# Patient Record
Sex: Male | Born: 1950 | Race: White | Hispanic: No | Marital: Married | Smoking: Former smoker
Health system: Southern US, Community
[De-identification: ages and names within clinical notes are randomized; demographics above are authoritative.]

## PROBLEM LIST (undated history)

## (undated) DIAGNOSIS — K5790 Diverticulosis of intestine, part unspecified, without perforation or abscess without bleeding: Secondary | ICD-10-CM

## (undated) DIAGNOSIS — N189 Chronic kidney disease, unspecified: Secondary | ICD-10-CM

## (undated) DIAGNOSIS — C801 Malignant (primary) neoplasm, unspecified: Secondary | ICD-10-CM

## (undated) DIAGNOSIS — K219 Gastro-esophageal reflux disease without esophagitis: Secondary | ICD-10-CM

## (undated) DIAGNOSIS — I10 Essential (primary) hypertension: Secondary | ICD-10-CM

## (undated) HISTORY — PX: BOWEL RESECTION: SHX1257

## (undated) HISTORY — PX: TONSILLECTOMY: SUR1361

---

## 1997-04-19 HISTORY — PX: BOWEL RESECTION: SHX1257

## 1997-12-26 ENCOUNTER — Ambulatory Visit (HOSPITAL_COMMUNITY): Admission: RE | Admit: 1997-12-26 | Discharge: 1997-12-26 | Payer: Self-pay | Admitting: Gastroenterology

## 1998-01-16 ENCOUNTER — Ambulatory Visit (HOSPITAL_COMMUNITY): Admission: RE | Admit: 1998-01-16 | Discharge: 1998-01-16 | Payer: Self-pay | Admitting: Gastroenterology

## 1998-01-16 ENCOUNTER — Encounter: Payer: Self-pay | Admitting: Gastroenterology

## 1998-01-20 ENCOUNTER — Encounter: Payer: Self-pay | Admitting: Emergency Medicine

## 1998-01-20 ENCOUNTER — Encounter: Payer: Self-pay | Admitting: Gastroenterology

## 1998-01-20 ENCOUNTER — Inpatient Hospital Stay (HOSPITAL_COMMUNITY): Admission: EM | Admit: 1998-01-20 | Discharge: 1998-01-24 | Payer: Self-pay | Admitting: Emergency Medicine

## 1998-02-10 ENCOUNTER — Encounter (HOSPITAL_BASED_OUTPATIENT_CLINIC_OR_DEPARTMENT_OTHER): Payer: Self-pay | Admitting: General Surgery

## 1998-02-12 ENCOUNTER — Inpatient Hospital Stay (HOSPITAL_COMMUNITY): Admission: RE | Admit: 1998-02-12 | Discharge: 1998-02-17 | Payer: Self-pay | Admitting: General Surgery

## 2013-03-11 ENCOUNTER — Encounter (HOSPITAL_BASED_OUTPATIENT_CLINIC_OR_DEPARTMENT_OTHER): Payer: Self-pay | Admitting: Emergency Medicine

## 2013-03-11 ENCOUNTER — Inpatient Hospital Stay (HOSPITAL_BASED_OUTPATIENT_CLINIC_OR_DEPARTMENT_OTHER)
Admission: EM | Admit: 2013-03-11 | Discharge: 2013-03-13 | DRG: 392 | Disposition: A | Discharge status: Home / Self Care | Payer: BC Managed Care – PPO | Attending: Internal Medicine | Admitting: Internal Medicine

## 2013-03-11 ENCOUNTER — Emergency Department (HOSPITAL_BASED_OUTPATIENT_CLINIC_OR_DEPARTMENT_OTHER): Payer: BC Managed Care – PPO

## 2013-03-11 DIAGNOSIS — Z9049 Acquired absence of other specified parts of digestive tract: Secondary | ICD-10-CM

## 2013-03-11 DIAGNOSIS — Z823 Family history of stroke: Secondary | ICD-10-CM

## 2013-03-11 DIAGNOSIS — K5732 Diverticulitis of large intestine without perforation or abscess without bleeding: Principal | ICD-10-CM

## 2013-03-11 DIAGNOSIS — K219 Gastro-esophageal reflux disease without esophagitis: Secondary | ICD-10-CM | POA: Diagnosis present

## 2013-03-11 DIAGNOSIS — D696 Thrombocytopenia, unspecified: Secondary | ICD-10-CM | POA: Diagnosis present

## 2013-03-11 DIAGNOSIS — D72829 Elevated white blood cell count, unspecified: Secondary | ICD-10-CM | POA: Diagnosis present

## 2013-03-11 DIAGNOSIS — K5792 Diverticulitis of intestine, part unspecified, without perforation or abscess without bleeding: Secondary | ICD-10-CM | POA: Diagnosis present

## 2013-03-11 HISTORY — DX: Diverticulosis of intestine, part unspecified, without perforation or abscess without bleeding: K57.90

## 2013-03-11 HISTORY — DX: Gastro-esophageal reflux disease without esophagitis: K21.9

## 2013-03-11 LAB — CBC WITH DIFFERENTIAL/PLATELET
Basophils Absolute: 0 10*3/uL (ref 0.0–0.1)
Basophils Relative: 0 % (ref 0–1)
Eosinophils Absolute: 0.1 10*3/uL (ref 0.0–0.7)
Eosinophils Relative: 0 % (ref 0–5)
HCT: 45 % (ref 39.0–52.0)
Hemoglobin: 15.8 g/dL (ref 13.0–17.0)
Lymphocytes Relative: 10 % — ABNORMAL LOW (ref 12–46)
Lymphs Abs: 2.2 10*3/uL (ref 0.7–4.0)
MCH: 28.8 pg (ref 26.0–34.0)
MCHC: 35.1 g/dL (ref 30.0–36.0)
MCV: 82.1 fL (ref 78.0–100.0)
Monocytes Absolute: 1.8 10*3/uL — ABNORMAL HIGH (ref 0.1–1.0)
Monocytes Relative: 8 % (ref 3–12)
Neutro Abs: 18.6 10*3/uL — ABNORMAL HIGH (ref 1.7–7.7)
Neutrophils Relative %: 82 % — ABNORMAL HIGH (ref 43–77)
Platelets: 169 10*3/uL (ref 150–400)
RBC: 5.48 MIL/uL (ref 4.22–5.81)
RDW: 13.6 % (ref 11.5–15.5)
WBC: 22.6 10*3/uL — ABNORMAL HIGH (ref 4.0–10.5)

## 2013-03-11 LAB — COMPREHENSIVE METABOLIC PANEL
ALT: 20 U/L (ref 0–53)
AST: 22 U/L (ref 0–37)
Albumin: 3.9 g/dL (ref 3.5–5.2)
Alkaline Phosphatase: 62 U/L (ref 39–117)
BUN: 19 mg/dL (ref 6–23)
CO2: 26 mEq/L (ref 19–32)
Calcium: 8.9 mg/dL (ref 8.4–10.5)
Chloride: 100 mEq/L (ref 96–112)
Creatinine, Ser: 1.2 mg/dL (ref 0.50–1.35)
GFR calc Af Amer: 74 mL/min — ABNORMAL LOW (ref >90)
GFR calc non Af Amer: 64 mL/min — ABNORMAL LOW (ref >90)
Glucose, Bld: 101 mg/dL — ABNORMAL HIGH (ref 70–99)
Potassium: 3.9 mEq/L (ref 3.5–5.1)
Sodium: 137 mEq/L (ref 135–145)
Total Bilirubin: 1.8 mg/dL — ABNORMAL HIGH (ref 0.3–1.2)
Total Protein: 7 g/dL (ref 6.0–8.3)

## 2013-03-11 LAB — CBC
HCT: 39.6 % (ref 39.0–52.0)
Hemoglobin: 14.1 g/dL (ref 13.0–17.0)
MCH: 29.1 pg (ref 26.0–34.0)
MCHC: 35.6 g/dL (ref 30.0–36.0)
MCV: 81.6 fL (ref 78.0–100.0)
RBC: 4.85 MIL/uL (ref 4.22–5.81)
WBC: 14.9 10*3/uL — ABNORMAL HIGH (ref 4.0–10.5)

## 2013-03-11 LAB — URINE MICROSCOPIC-ADD ON

## 2013-03-11 LAB — URINALYSIS, ROUTINE W REFLEX MICROSCOPIC
Bilirubin Urine: NEGATIVE
Hgb urine dipstick: NEGATIVE
Leukocytes, UA: NEGATIVE
Nitrite: NEGATIVE
Protein, ur: 30 mg/dL — AB
Urobilinogen, UA: 1 mg/dL (ref 0.0–1.0)

## 2013-03-11 LAB — CREATININE, SERUM
Creatinine, Ser: 1.08 mg/dL (ref 0.50–1.35)
GFR calc Af Amer: 84 mL/min — ABNORMAL LOW (ref >90)

## 2013-03-11 IMAGING — CT CT ABD-PELV W/ CM
2 of 5 series · 16 of 46 positions shown, 18 images · IV contrast (APPLIED)
Comparison: None.

CLINICAL DATA: Left lower quadrant abdominal pain for 1 day

EXAM:
CT ABDOMEN AND PELVIS WITH CONTRAST
TECHNIQUE: Multidetector CT imaging of the abdomen and pelvis was performed
using the standard protocol following bolus administration of
intravenous contrast.
CONTRAST:  50mL OMNIPAQUE IOHEXOL 300 MG/ML SOLN, 100mL OMNIPAQUE
IOHEXOL 300 MG/ML SOLN

[Series 2: abd/pelvis 5.0 b31f · axial · 0.78mm/px · z∈[+844,+1294]mm · 13 of 101 slices shown, 15 images]
[im 6/101  soft-tissue]
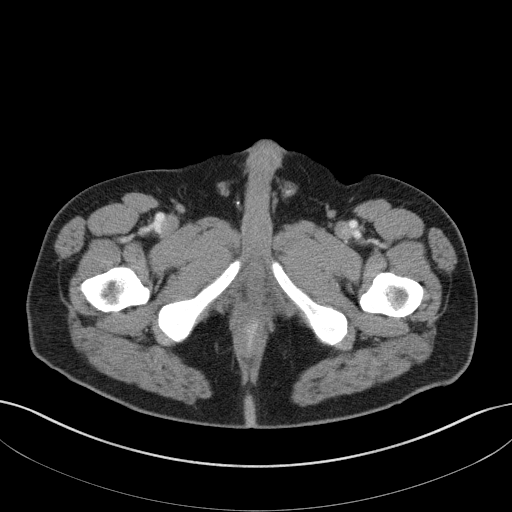
[im 6/101  bone]
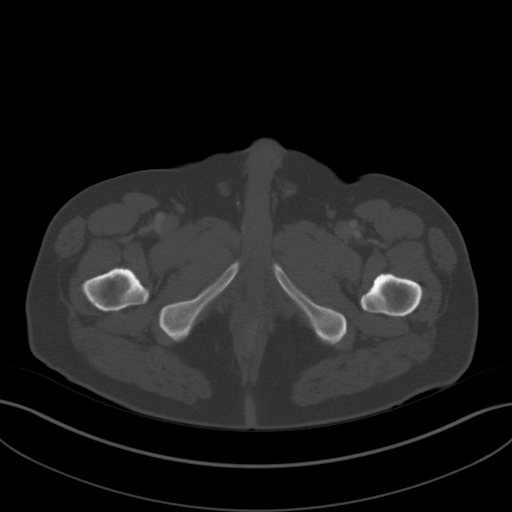
[im 16/101  soft-tissue]
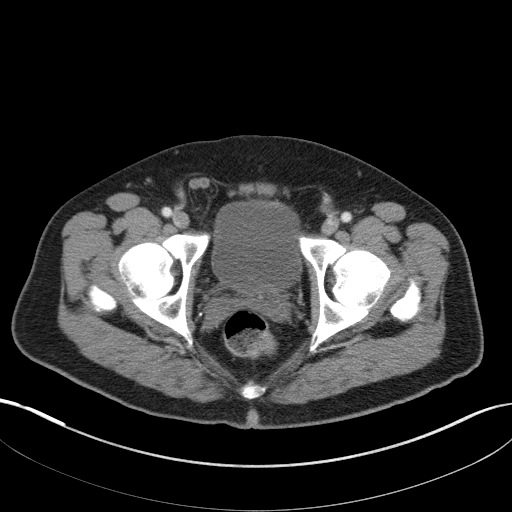
[im 21/101  soft-tissue]
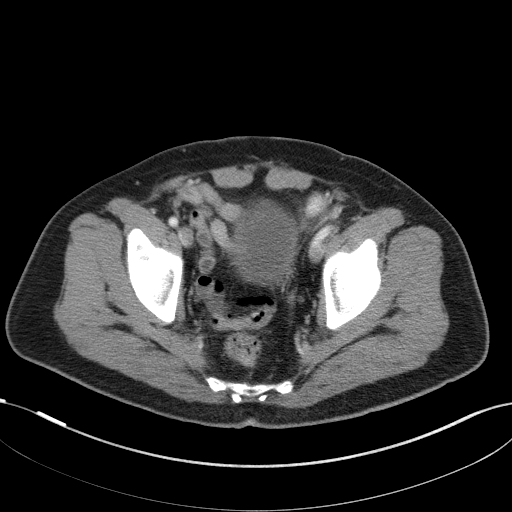
[im 31/101  soft-tissue]
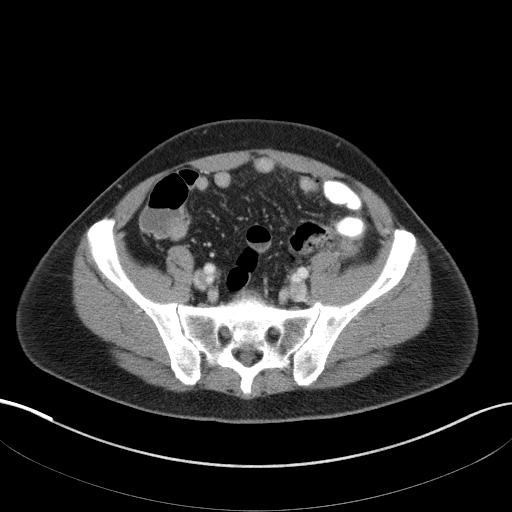
[im 36/101  soft-tissue]
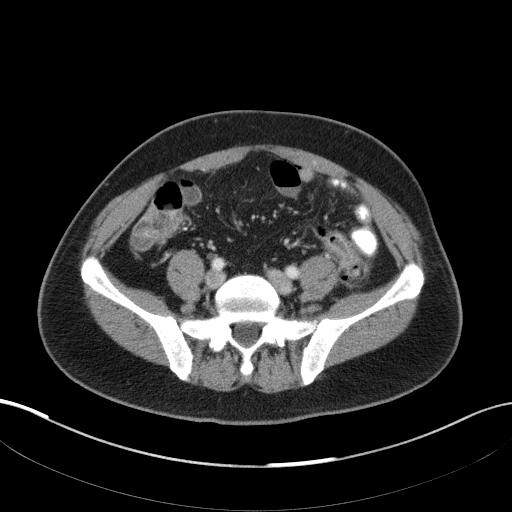
[im 46/101  soft-tissue]
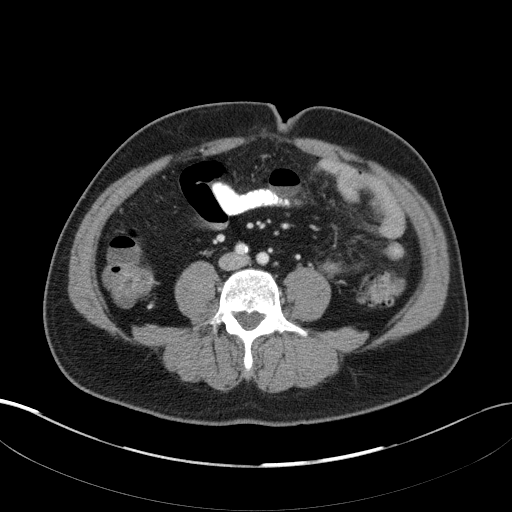
[im 51/101  soft-tissue]
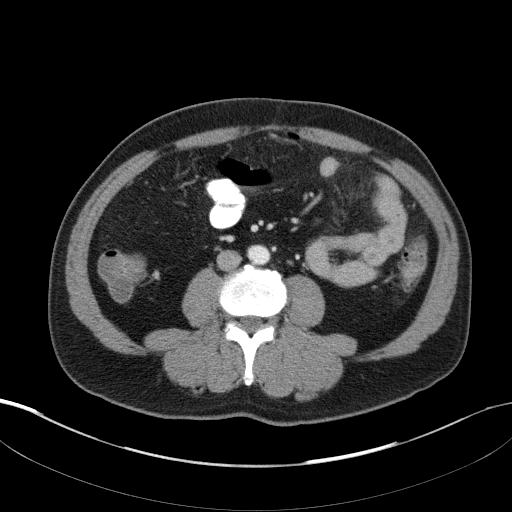
[im 56/101  soft-tissue]
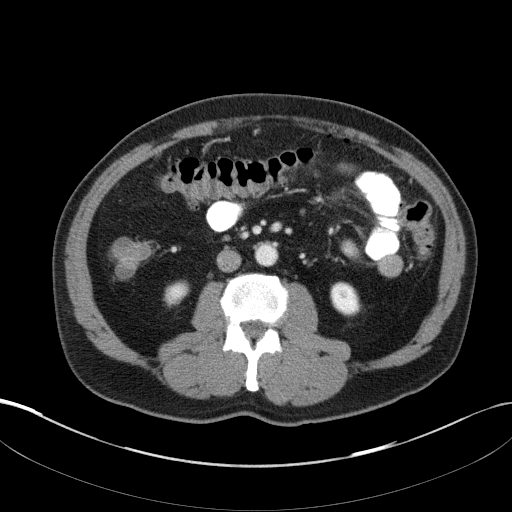
[im 66/101  soft-tissue]
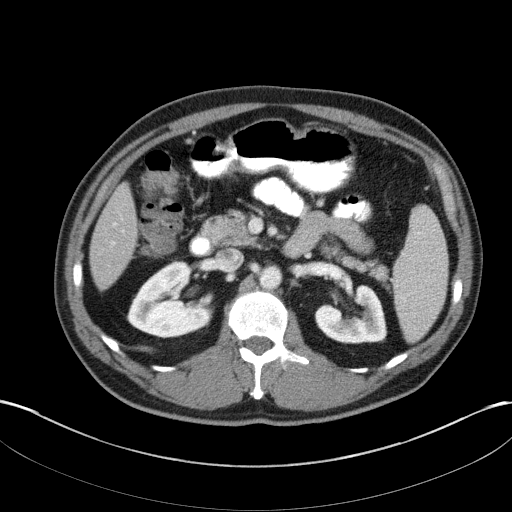
[im 66/101  bone]
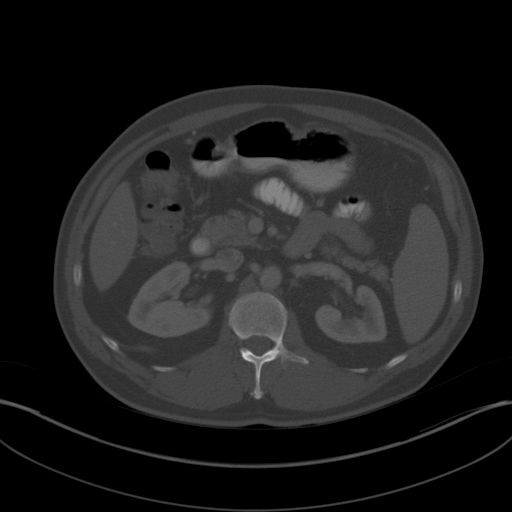
[im 71/101  soft-tissue]
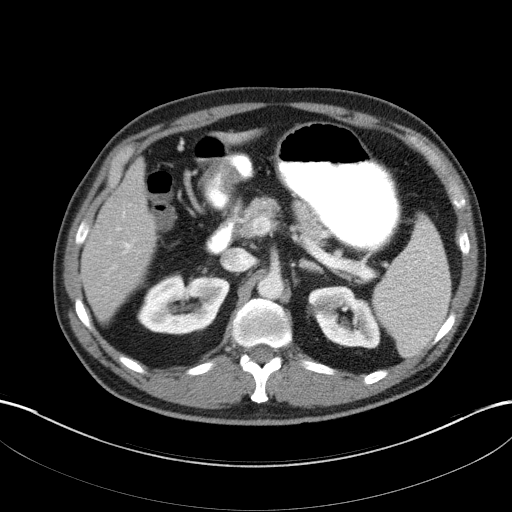
[im 81/101  soft-tissue]
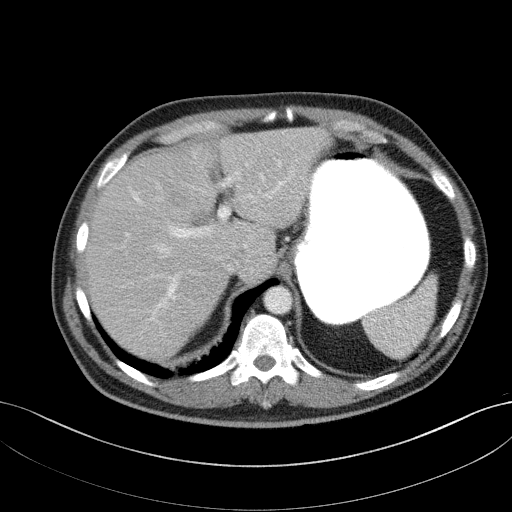
[im 86/101  soft-tissue]
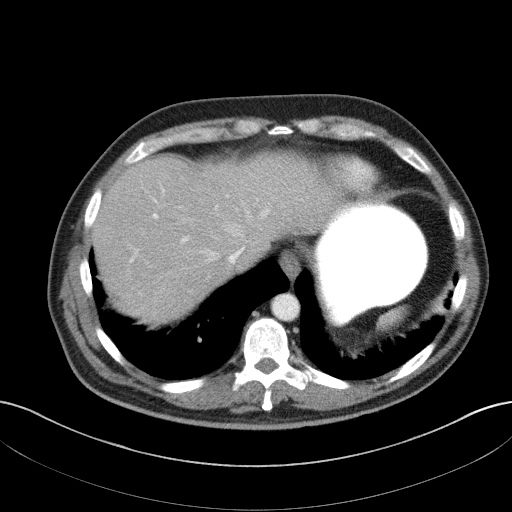
[im 96/101  soft-tissue]
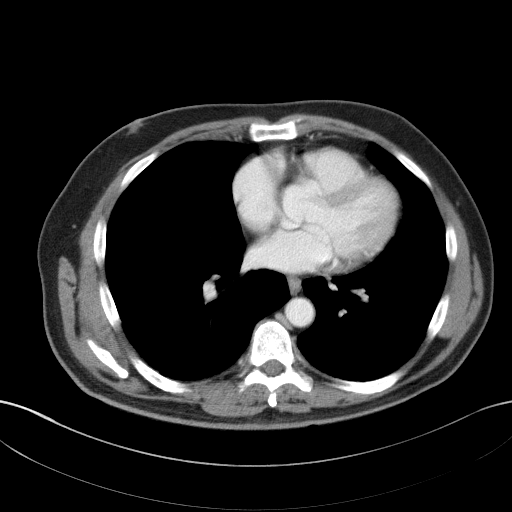

[Series 5: abd/pelvis 3.0 coronal · coronal · 0.93mm/px · 3 of 81 slices shown]
[im 27/81  soft-tissue]
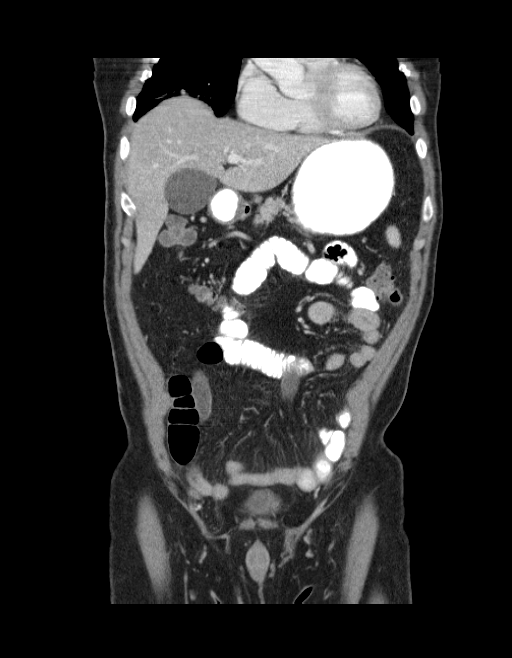
[im 36/81  soft-tissue]
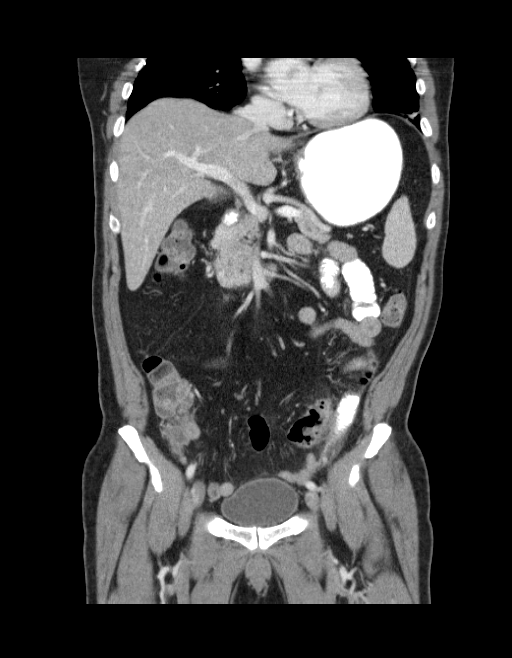
[im 45/81  soft-tissue]
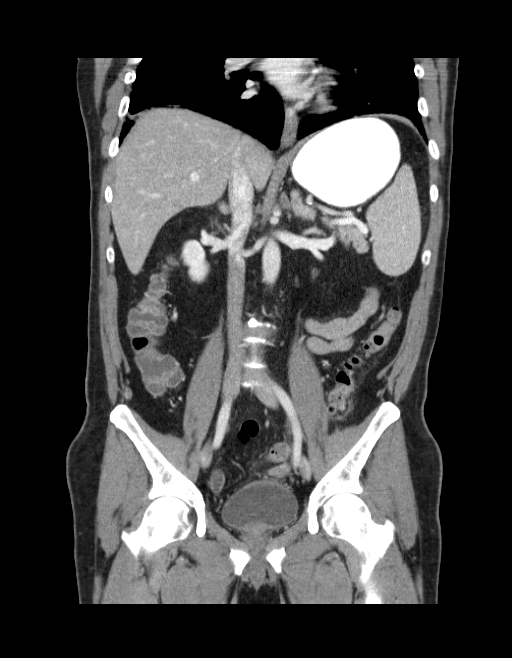

[16 of 46 positions shown; findings below may reference images not displayed]

FINDINGS: Liver, gallbladder, spleen, pancreas, kidneys, and adrenal glands
are normal. Abdominal aorta is normal.

There is a nonobstructive bowel gas pattern. There is mild to
moderate diverticulosis of the distal descending colon and of the
sigmoid colon there is mild diverticulosis. Patient appears to be
status post partial resection and reanastomosis of sigmoid colon.
There is mild inflammatory change in the mesenteric fat at the
junction of the descending and sigmoid colon with mild colon wall
thickening. Bladder and reproductive organs are normal. There is no
ascites, abscess, or free air.

The visualized portions of the lung bases are clear. There are no
acute musculoskeletal findings.
IMPRESSION: Mild acute diverticulitis

## 2013-03-11 MED ORDER — PANTOPRAZOLE SODIUM 40 MG IV SOLR
40.0000 mg | INTRAVENOUS | Status: DC
  Administered 2013-03-11 – 2013-03-12 (×2): 40 mg via INTRAVENOUS
  Filled 2013-03-11 (×4): qty 40

## 2013-03-11 MED ORDER — SODIUM CHLORIDE 0.9 % IV SOLN
INTRAVENOUS | Status: DC
  Administered 2013-03-11: 19:00:00 via INTRAVENOUS

## 2013-03-11 MED ORDER — HYDROMORPHONE HCL PF 1 MG/ML IJ SOLN: 0.5000 mg | INTRAMUSCULAR | Status: DC | PRN

## 2013-03-11 MED ORDER — ALBUTEROL SULFATE (5 MG/ML) 0.5% IN NEBU: 2.5000 mg | INHALATION_SOLUTION | RESPIRATORY_TRACT | Status: DC | PRN

## 2013-03-11 MED ORDER — SODIUM CHLORIDE 0.9 % IV BOLUS (SEPSIS)
500.0000 mL | Freq: Once | INTRAVENOUS | Status: AC
  Administered 2013-03-11: 500 mL via INTRAVENOUS

## 2013-03-11 MED ORDER — METRONIDAZOLE IN NACL 5-0.79 MG/ML-% IV SOLN
500.0000 mg | Freq: Three times a day (TID) | INTRAVENOUS | Status: DC
  Administered 2013-03-12 – 2013-03-13 (×5): 500 mg via INTRAVENOUS
  Filled 2013-03-11 (×8): qty 100

## 2013-03-11 MED ORDER — ONDANSETRON HCL 4 MG/2ML IJ SOLN: 4.0000 mg | Freq: Three times a day (TID) | INTRAMUSCULAR | Status: DC | PRN

## 2013-03-11 MED ORDER — MORPHINE SULFATE 2 MG/ML IJ SOLN
1.0000 mg | INTRAMUSCULAR | Status: DC | PRN
  Administered 2013-03-11: 1 mg via INTRAVENOUS
  Filled 2013-03-11: qty 1

## 2013-03-11 MED ORDER — ENOXAPARIN SODIUM 40 MG/0.4ML ~~LOC~~ SOLN
40.0000 mg | SUBCUTANEOUS | Status: DC
  Administered 2013-03-11 – 2013-03-12 (×2): 40 mg via SUBCUTANEOUS
  Filled 2013-03-11 (×4): qty 0.4

## 2013-03-11 MED ORDER — ACETAMINOPHEN 325 MG PO TABS: 650.0000 mg | ORAL_TABLET | Freq: Four times a day (QID) | ORAL | Status: DC | PRN

## 2013-03-11 MED ORDER — IOHEXOL 300 MG/ML  SOLN
50.0000 mL | Freq: Once | INTRAMUSCULAR | Status: AC | PRN
  Administered 2013-03-11: 50 mL via ORAL

## 2013-03-11 MED ORDER — MORPHINE SULFATE 4 MG/ML IJ SOLN
4.0000 mg | Freq: Once | INTRAMUSCULAR | Status: AC
  Administered 2013-03-11: 4 mg via INTRAVENOUS
  Filled 2013-03-11: qty 1

## 2013-03-11 MED ORDER — IOHEXOL 300 MG/ML  SOLN
100.0000 mL | Freq: Once | INTRAMUSCULAR | Status: AC | PRN
  Administered 2013-03-11: 100 mL via INTRAVENOUS

## 2013-03-11 MED ORDER — CIPROFLOXACIN IN D5W 400 MG/200ML IV SOLN
400.0000 mg | Freq: Two times a day (BID) | INTRAVENOUS | Status: DC
  Administered 2013-03-12 – 2013-03-13 (×3): 400 mg via INTRAVENOUS
  Filled 2013-03-11 (×5): qty 200

## 2013-03-11 MED ORDER — SODIUM CHLORIDE 0.9 % IV SOLN
INTRAVENOUS | Status: DC
  Administered 2013-03-12: 14:00:00 via INTRAVENOUS

## 2013-03-11 MED ORDER — ONDANSETRON HCL 4 MG/2ML IJ SOLN
4.0000 mg | Freq: Once | INTRAMUSCULAR | Status: AC
  Administered 2013-03-11: 4 mg via INTRAVENOUS
  Filled 2013-03-11: qty 2

## 2013-03-11 MED ORDER — ACETAMINOPHEN 650 MG RE SUPP: 650.0000 mg | Freq: Four times a day (QID) | RECTAL | Status: DC | PRN

## 2013-03-11 MED ORDER — CIPROFLOXACIN HCL 500 MG PO TABS
500.0000 mg | ORAL_TABLET | Freq: Once | ORAL | Status: AC
  Administered 2013-03-11: 500 mg via ORAL
  Filled 2013-03-11: qty 1

## 2013-03-11 MED ORDER — OXYCODONE HCL 5 MG PO TABS
5.0000 mg | ORAL_TABLET | ORAL | Status: DC | PRN
  Administered 2013-03-12 (×2): 5 mg via ORAL
  Filled 2013-03-11 (×2): qty 1

## 2013-03-11 MED ORDER — METRONIDAZOLE 500 MG PO TABS
500.0000 mg | ORAL_TABLET | Freq: Once | ORAL | Status: AC
  Administered 2013-03-11: 500 mg via ORAL
  Filled 2013-03-11: qty 1

## 2013-03-11 NOTE — ED Provider Notes (Signed)
CSN: [id number]     Arrival date & time 03/11/13  1216 History   First MD Initiated Contact with Patient 03/11/13 1258     Chief Complaint  Patient presents with  . Abdominal Pain   (Consider location/radiation/quality/duration/timing/severity/associated sxs/prior Treatment) HPI Comments: Patient presents with a two-day history of lower abdominal pain. He states it started suddenly yesterday when he was eating some per day take. He describes as a sharp pain to his left lower abdomen that radiates across his abdomen. He denies any back pain. He denies any nausea vomiting or diarrhea. He denies any fevers or urinary symptoms. He states the pain got worse throughout the day today. It waxes and wanes in intensity. He has a history of diverticulitis in the past with a partial bowel resection. He states this pain is worse than his typical diverticulitis type pain. He denies any diarrhea or hematochezia. He states she's having normal bowel movements.  Patient is a 62 y.o. male presenting with abdominal pain.  Abdominal Pain Associated symptoms: no chest pain, no chills, no cough, no diarrhea, no fatigue, no fever, no hematuria, no nausea, no shortness of breath and no vomiting     Past Medical History  Diagnosis Date  . Diverticulosis   . GERD (gastroesophageal reflux disease)    Past Surgical History  Procedure Laterality Date  . Bowel resection     No family history on file. History  Substance Use Topics  . Smoking status: Never Smoker   . Smokeless tobacco: Not on file  . Alcohol Use: Not on file    Review of Systems  Constitutional: Negative for fever, chills, diaphoresis and fatigue.  HENT: Negative for congestion, rhinorrhea and sneezing.   Eyes: Negative.   Respiratory: Negative for cough, chest tightness and shortness of breath.   Cardiovascular: Negative for chest pain and leg swelling.  Gastrointestinal: Positive for abdominal pain. Negative for nausea, vomiting, diarrhea  and blood in stool.  Genitourinary: Negative for frequency, hematuria, flank pain and difficulty urinating.  Musculoskeletal: Negative for arthralgias and back pain.  Skin: Negative for rash.  Neurological: Negative for dizziness, speech difficulty, weakness, numbness and headaches.    Allergies  Review of patient's allergies indicates no known allergies.  Home Medications   Current Outpatient Rx  Name  Route  Sig  Dispense  Refill  . omeprazole (PRILOSEC) 40 MG capsule   Oral   Take 40 mg by mouth daily.          BP 123/71  Pulse 71  Temp(Src) 98.2 F (36.8 C) (Oral)  Resp 18  Ht 5\' 6"  (1.676 m)  Wt 160 lb (72.576 kg)  BMI 25.84 kg/m2  SpO2 95% Physical Exam  Constitutional: He is oriented to person, place, and time. He appears well-developed and well-nourished.  HENT:  Head: Normocephalic and atraumatic.  Eyes: Pupils are equal, round, and reactive to light.  Neck: Normal range of motion. Neck supple.  Cardiovascular: Normal rate, regular rhythm and normal heart sounds.   Pulmonary/Chest: Effort normal and breath sounds normal. No respiratory distress. He has no wheezes. He has no rales. He exhibits no tenderness.  Abdominal: Soft. Bowel sounds are normal. There is no tenderness (marked tenderness to the left lower abdomen and suprapubic area. There some mild tenderness to the right lower quadrant.). There is no rebound and no guarding.  Musculoskeletal: Normal range of motion. He exhibits no edema.  Lymphadenopathy:    He has no cervical adenopathy.  Neurological: He is alert and  oriented to person, place, and time.  Skin: Skin is warm and dry. No rash noted.  Psychiatric: He has a normal mood and affect.    ED Course  Procedures (including critical care time) Labs Review Results for orders placed during the hospital encounter of 03/11/13  CBC WITH DIFFERENTIAL      Result Value Range   WBC 22.6 (*) 4.0 - 10.5 K/uL   RBC 5.48  4.22 - 5.81 MIL/uL   Hemoglobin  15.8  13.0 - 17.0 g/dL   HCT 19.1  47.8 - 29.5 %   MCV 82.1  78.0 - 100.0 fL   MCH 28.8  26.0 - 34.0 pg   MCHC 35.1  30.0 - 36.0 g/dL   RDW 62.1  30.8 - 65.7 %   Platelets 169  150 - 400 K/uL   Neutrophils Relative % 82 (*) 43 - 77 %   Neutro Abs 18.6 (*) 1.7 - 7.7 K/uL   Lymphocytes Relative 10 (*) 12 - 46 %   Lymphs Abs 2.2  0.7 - 4.0 K/uL   Monocytes Relative 8  3 - 12 %   Monocytes Absolute 1.8 (*) 0.1 - 1.0 K/uL   Eosinophils Relative 0  0 - 5 %   Eosinophils Absolute 0.1  0.0 - 0.7 K/uL   Basophils Relative 0  0 - 1 %   Basophils Absolute 0.0  0.0 - 0.1 K/uL  COMPREHENSIVE METABOLIC PANEL      Result Value Range   Sodium 137  135 - 145 mEq/L   Potassium 3.9  3.5 - 5.1 mEq/L   Chloride 100  96 - 112 mEq/L   CO2 26  19 - 32 mEq/L   Glucose, Bld 101 (*) 70 - 99 mg/dL   BUN 19  6 - 23 mg/dL   Creatinine, Ser 8.46  0.50 - 1.35 mg/dL   Calcium 8.9  8.4 - 96.2 mg/dL   Total Protein 7.0  6.0 - 8.3 g/dL   Albumin 3.9  3.5 - 5.2 g/dL   AST 22  0 - 37 U/L   ALT 20  0 - 53 U/L   Alkaline Phosphatase 62  39 - 117 U/L   Total Bilirubin 1.8 (*) 0.3 - 1.2 mg/dL   GFR calc non Af Amer 64 (*) >90 mL/min   GFR calc Af Amer 74 (*) >90 mL/min  URINALYSIS, ROUTINE W REFLEX MICROSCOPIC      Result Value Range   Color, Urine AMBER (*) YELLOW   APPearance CLEAR  CLEAR   Specific Gravity, Urine 1.030  1.005 - 1.030   pH 6.0  5.0 - 8.0   Glucose, UA NEGATIVE  NEGATIVE mg/dL   Hgb urine dipstick NEGATIVE  NEGATIVE   Bilirubin Urine NEGATIVE  NEGATIVE   Ketones, ur 15 (*) NEGATIVE mg/dL   Protein, ur 30 (*) NEGATIVE mg/dL   Urobilinogen, UA 1.0  0.0 - 1.0 mg/dL   Nitrite NEGATIVE  NEGATIVE   Leukocytes, UA NEGATIVE  NEGATIVE  URINE MICROSCOPIC-ADD ON      Result Value Range   WBC, UA 0-2  <3 WBC/hpf   Bacteria, UA MANY (*) RARE   Urine-Other MUCOUS PRESENT     No results found.   Imaging Review Ct Abdomen Pelvis W Contrast  03/11/2013   CLINICAL DATA:  Left lower quadrant  abdominal pain for 1 day  EXAM: CT ABDOMEN AND PELVIS WITH CONTRAST  TECHNIQUE: Multidetector CT imaging of the abdomen and pelvis was performed using the  standard protocol following bolus administration of intravenous contrast.  CONTRAST:  50mL OMNIPAQUE IOHEXOL 300 MG/ML SOLN, OMNIPAQUE IOHEXOL 300 MG/ML SOLN  COMPARISON:  None.  FINDINGS: Liver, gallbladder, spleen, pancreas, kidneys, and adrenal glands are normal. Abdominal aorta is normal.  There is a nonobstructive bowel gas pattern. There is mild to moderate diverticulosis of the distal descending colon and of the sigmoid colon there is mild diverticulosis. Patient appears to be status post partial resection and reanastomosis of sigmoid colon. There is mild inflammatory change in the mesenteric fat at the junction of the descending and sigmoid colon with mild colon wall thickening. Bladder and reproductive organs are normal. There is no ascites, abscess, or free air.  The visualized portions of the lung bases are clear. There are no acute musculoskeletal findings.  IMPRESSION: Mild acute diverticulitis   Electronically Signed   By: Esperanza Heir M.D.   On: 03/11/2013 15:49    EKG Interpretation   None       MDM   1. Diverticulitis    On exam patient is still pretty markedly tender in his left lower quadrant. He has an elevated white count 22,000. The CT scan shows diverticulitis without evidence of perforation or abscess. He was started on Cipro and Flagyl. However given his amount of tenderness, I feel more comfortable with admission, observation.  Spoke with Dr Jomarie Longs who will admit pt.    Rolan Bucco, MD 03/11/13 [phone]

## 2013-03-11 NOTE — H&P (Signed)
PATIENT DETAILS Name: Derek Cardenas Age: 62 y.o. Sex: male Date of Birth: [birth year] Admit Date: 03/11/2013 YNW:[id number],YQMVH A, MD   CHIEF COMPLAINT:  Left-sided abdominal pain since yesterday evening  HPI: Derek Cardenas is a 62 y.o. male with a Past Medical History of multiple episodes of diverticulitis one of them requiring colectomy due to perforation in the 1990s  presents today with the above noted complaint. Patient apparently was in his usual state of health until yesterday evening around 9 PM when he noticed that he was having some left lower quadrant abdominal pain. As the night progressed to slowly worsened. He claims that the pain is worsened by any sort of activity, and at rest it is not that bad. Pain is described as sharp, with no radiation. There is no associated fever, nausea, vomiting or diarrhea. Because of the persistent and worsening pain, patient presented to med center [facility], per CT scan of the abdomen showed mild left-sided diverticulitis, a CBC showed significant leukocytosis. Patient was then transferred to the hospitalist service at [facility] for further evaluation and treatment. Patient denies any fever, headache, chest pain, shortness of breath, nausea, vomiting or diarrhea. There is no history of dysuria.   ALLERGIES:  No Known Allergies  PAST MEDICAL HISTORY: Past Medical History  Diagnosis Date  . Diverticulosis   . GERD (gastroesophageal reflux disease)     PAST SURGICAL HISTORY: Past Surgical History  Procedure Laterality Date  . Bowel resection      MEDICATIONS AT HOME: Prior to Admission medications   Medication Sig Start Date End Date Taking? Authorizing Provider  omeprazole (PRILOSEC) 40 MG capsule Take 40 mg by mouth daily.   Yes Historical Provider, MD    FAMILY HISTORY: Father/mother-[facility]  SOCIAL HISTORY:  reports that he has never smoked. He does not have any smokeless tobacco history on file. His alcohol and drug  histories are not on file. Works as a Medical illustrator  REVIEW OF SYSTEMS:  Constitutional:   No  weight loss, night sweats,  Fevers, chills, fatigue.  HEENT:    No headaches, Difficulty swallowing,Tooth/dental problems,Sore throat,  No sneezing, itching, ear ache, nasal congestion, post nasal drip,   Cardio-vascular: No chest pain,  Orthopnea, PND, swelling in lower extremities, anasarca,  dizziness, palpitations  GI:  No heartburn, indigestion,  nausea, vomiting, diarrhea, change in  bowel habits, loss of appetite  Resp: No shortness of breath with exertion or at rest.  No excess mucus, no productive cough, No non-productive cough,  No coughing up of blood.No change in color of mucus.No wheezing.No chest wall deformity  Skin:  no rash or lesions.  GU:  no dysuria, change in color of urine, no urgency or frequency.  No flank pain.  Musculoskeletal: No joint pain or swelling.  No decreased range of motion.  No back pain.  Psych: No change in mood or affect. No depression or anxiety.  No memory loss.   PHYSICAL EXAM: Blood pressure 131/76, pulse 74, temperature 98.1 F (36.7 C), temperature source Oral, resp. rate 18, height 5\' 6"  (1.676 m), weight 71.4 kg (157 lb 6.5 oz), SpO2 94.00%.  General appearance :Awake, alert, not in any distress. Speech Clear. Not toxic Looking HEENT: Atraumatic and Normocephalic, pupils equally reactive to light and accomodation Neck: supple, no JVD. No cervical lymphadenopathy.  Chest:Good air entry bilaterally, no added sounds  CVS: S1 S2 regular, no murmurs.  Abdomen: Bowel sounds present, Moderate tenderness in the LLQ and pelvic area, without  gaurding, rigidity  or rebound. Extremities: B/L Lower Ext shows no edema, both legs are warm to touch Neurology: Awake alert, and oriented X 3, CN II-XII intact, Non focal Skin:No Rash Wounds:N/A  LABS ON ADMISSION:   Recent Labs  03/11/13 1405  NA 137  K 3.9  CL 100  CO2 26  GLUCOSE 101*  BUN 19   CREATININE 1.20  CALCIUM 8.9    Recent Labs  03/11/13 1405  AST 22  ALT 20  ALKPHOS 62  BILITOT 1.8*  PROT 7.0  ALBUMIN 3.9   No results found for this basename: LIPASE, AMYLASE,  in the last 72 hours  Recent Labs  03/11/13 1405  WBC 22.6*  NEUTROABS 18.6*  HGB 15.8  HCT 45.0  MCV 82.1  PLT 169   No results found for this basename: CKTOTAL, CKMB, CKMBINDEX, TROPONINI,  in the last 72 hours No results found for this basename: DDIMER,  in the last 72 hours No components found with this basename: POCBNP,    RADIOLOGIC STUDIES ON ADMISSION: Ct Abdomen Pelvis W Contrast  03/11/2013   CLINICAL DATA:  Left lower quadrant abdominal pain for 1 day  EXAM: CT ABDOMEN AND PELVIS WITH CONTRAST  TECHNIQUE: Multidetector CT imaging of the abdomen and pelvis was performed using the standard protocol following bolus administration of intravenous contrast.  CONTRAST:  50mL OMNIPAQUE IOHEXOL 300 MG/ML SOLN, OMNIPAQUE IOHEXOL 300 MG/ML SOLN  COMPARISON:  None.  FINDINGS: Liver, gallbladder, spleen, pancreas, kidneys, and adrenal glands are normal. Abdominal aorta is normal.  There is a nonobstructive bowel gas pattern. There is mild to moderate diverticulosis of the distal descending colon and of the sigmoid colon there is mild diverticulosis. Patient appears to be status post partial resection and reanastomosis of sigmoid colon. There is mild inflammatory change in the mesenteric fat at the junction of the descending and sigmoid colon with mild colon wall thickening. Bladder and reproductive organs are normal. There is no ascites, abscess, or free air.  The visualized portions of the lung bases are clear. There are no acute musculoskeletal findings.  IMPRESSION: Mild acute diverticulitis   Electronically Signed   By: Esperanza Heir M.D.   On: 03/11/2013 15:49    ASSESSMENT AND PLAN: Present on Admission:  . Acute diverticulitis - Was admitted to a medical surgical unit, keep n.p.o.  overnight, start on empiric Cipro and Flagyl. Okay for ice chips. Patient's clinical course will be followed very closely, reassessment will be done in the morning, if better we will slowly advance diet. We will repeat a CBC tomorrow as well. The patient has significant leukocytosis, he is afebrile and has a nontoxic appearance. He also does not look acutely sick.   Marland Kitchen GERD (gastroesophageal reflux disease) - Will place on PPI  Further plan will depend as patient's clinical course evolves and further radiologic and laboratory data become available. Patient will be monitored closely.   DVT Prophylaxis: Prophylactic Lovenox  Code Status: Full Code    Total time spent for admission equals 45 minutes.  [facility] Pager [phone]  If 7PM-7AM, please contact night-coverage [email or web address] Password [facility] 03/11/2013, 7:32 PM

## 2013-03-11 NOTE — Progress Notes (Signed)
[facility] transfer 62/M with LLQ pain, CT with Acute Diverticulitis, no perf or abscess, WBC elevated, vitals stable Accepted to Med Surg bed, Team 10  Zannie Cove, MD [phone]

## 2013-03-11 NOTE — ED Notes (Signed)
Pt having lower abdominal pain since yesterday.  No N/V/D.  No fever.

## 2013-03-11 NOTE — Progress Notes (Signed)
NURSING PROGRESS NOTE  Login [place] [id number] Admission Data: 03/11/2013 8:14 PM Attending Provider: Zannie Cove, MD WUJ:[id number],WGNFA A, MD Code Status: Full  Derek Cardenas is a 62 y.o. male patient admitted from ED:  -No acute distress noted.  -No complaints of shortness of breath.  -No complaints of chest pain.   Blood pressure 131/76, pulse 74, temperature 98.1 F (36.7 C), temperature source Oral, resp. rate 18, height 5\' 6"  (1.676 m), weight 71.4 kg (157 lb 6.5 oz), SpO2 94.00%.   IV Fluids:  IV in place, occlusive dsg intact without redness, IV cath antecubital left, condition patent and no redness Normal saline.   Allergies:  Review of patient's allergies indicates no known allergies.  Past Medical History:   has a past medical history of Diverticulosis and GERD (gastroesophageal reflux disease).  Past Surgical History:   has past surgical history that includes Bowel resection.  Social History:   reports that he has never smoked. He does not have any smokeless tobacco history on file.  Skin: Intact  Patient/Family orientated to room. Information packet given to patient/family. Admission inpatient armband information verified with patient/family to include name and date of birth and placed on patient arm. Side rails up x 2, fall assessment and education completed with patient/family. Patient/family able to verbalize understanding of risk associated with falls and verbalized understanding to call for assistance before getting out of bed. Call light within reach. Patient/family able to voice and demonstrate understanding of unit orientation instructions.    Will continue to evaluate and treat per MD orders.

## 2013-03-12 LAB — COMPREHENSIVE METABOLIC PANEL
ALT: 13 U/L (ref 0–53)
AST: 15 U/L (ref 0–37)
CO2: 22 mEq/L (ref 19–32)
Calcium: 8 mg/dL — ABNORMAL LOW (ref 8.4–10.5)
Chloride: 104 mEq/L (ref 96–112)
Creatinine, Ser: 1.15 mg/dL (ref 0.50–1.35)
GFR calc Af Amer: 78 mL/min — ABNORMAL LOW (ref >90)
GFR calc non Af Amer: 67 mL/min — ABNORMAL LOW (ref >90)
Glucose, Bld: 88 mg/dL (ref 70–99)
Potassium: 3.8 mEq/L (ref 3.5–5.1)
Total Bilirubin: 2 mg/dL — ABNORMAL HIGH (ref 0.3–1.2)

## 2013-03-12 LAB — CBC
Hemoglobin: 14.2 g/dL (ref 13.0–17.0)
MCH: 29.7 pg (ref 26.0–34.0)
MCV: 83.7 fL (ref 78.0–100.0)
Platelets: 127 10*3/uL — ABNORMAL LOW (ref 150–400)
RBC: 4.78 MIL/uL (ref 4.22–5.81)
WBC: 13.7 10*3/uL — ABNORMAL HIGH (ref 4.0–10.5)

## 2013-03-12 MED ORDER — POLYETHYLENE GLYCOL 3350 17 G PO PACK
17.0000 g | PACK | Freq: Two times a day (BID) | ORAL | Status: DC
  Administered 2013-03-12 (×2): 17 g via ORAL
  Filled 2013-03-12 (×6): qty 1

## 2013-03-12 NOTE — Progress Notes (Signed)
[facility] PROGRESS NOTE  Derek Cardenas [id number] DOB: 27-May-1950 DOA: 03/11/2013 PCP: Delorse Lek, MD  HPI/Subjective: Derek Cardenas is a 62 y.o. male with a Past Medical History of multiple episodes of diverticulitis one of them requiring colectomy due to perforation in the 1990s presented with abdominal pain. Patient apparently was in his usual state of health until 11/22 evening around 9 PM when he noticed that he was having some left lower quadrant abdominal pain. As the night progressed to slowly worsened. He claimed that the pain is worsened by any sort of activity, and at rest it is not that bad. Pain is described as sharp, with no radiation. There was no associated fever, nausea, vomiting or diarrhea. Because of the persistent and worsening pain, patient presented to med center [facility], per CT scan of the abdomen showed mild left-sided diverticulitis,  CBC showed significant leukocytosis. Patient was then transferred to the hospitalist service at [facility] for further evaluation and treatment.    Pt is feeling a little better as of 11/24, but still experiencing abdominal pain especially with movement.   Assessment/Plan:  1.Acute diverticulitis  - CT scan showed diverticulitis on left side and CBC showed leukocytosis to 22k - Started on empiric Cipro and Flagyl.  - On clear liquid diet - Patient's clinical course will be followed very closely, reassessment done this  Morning;  better so we will slowly advance diet. - Pt is currently afebrile, CBC rechecked this morning shows WBC improving (13.7)  - Still having some pain;he also does not look acutely sick.   2. GERD (gastroesophageal reflux disease)  - On PPI   Code Status: FULL Family Communication: family in room Disposition Plan: inpatient   Consultants:  none  Procedures:  CT abdomen  Antibiotics: Antibiotics Given (last 72 hours)   Date/Time Action Medication Dose Rate   03/11/13 1610  Given   ciprofloxacin (CIPRO) tablet 500 mg 500 mg    03/11/13 1610 Given   metroNIDAZOLE (FLAGYL) tablet 500 mg 500 mg    03/12/13 0016 Given   metroNIDAZOLE (FLAGYL) IVPB 500 mg 500 mg 100 mL/hr   03/12/13 0417 Given   ciprofloxacin (CIPRO) IVPB 400 mg 400 mg 200 mL/hr   03/12/13 5366 Given   metroNIDAZOLE (FLAGYL) IVPB 500 mg 500 mg 100 mL/hr       Objective: Filed Vitals:   03/12/13 0522  BP: 102/66  Pulse: 86  Temp: 98.8 F (37.1 C)  Resp: 18    Intake/Output Summary (Last 24 hours) at 03/12/13 0944 Last data filed at 03/12/13 0917  Gross per 24 hour  Intake    480 ml  Output      2 ml  Net    478 ml   Filed Weights   03/11/13 1229 03/11/13 1858  Weight: 72.576 kg (160 lb) 71.4 kg (157 lb 6.5 oz)    Exam:   General:  wdwn male in nad, sitting up in bed  Cardiovascular: RRR, no m/g/r  Respiratory: clear to auscultation bilat, no inc wob  Abdomen: +BS, slightly protuberant, normal to percussion, nontender on right, still tender to palpation from midline to left side  Musculoskeletal: ROM normal x 4, no edema  Data Reviewed: Basic Metabolic Panel:  Recent Labs Lab 03/11/13 1405 03/11/13 2035 03/12/13 0415  NA 137  --  136  K 3.9  --  3.8  CL 100  --  104  CO2 26  --  22  GLUCOSE 101*  --  88  BUN 19  --  16  CREATININE 1.20 1.08 1.15  CALCIUM 8.9  --  8.0*   Liver Function Tests:  Recent Labs Lab 03/11/13 1405 03/12/13 0415  AST 22 15  ALT 20 13  ALKPHOS 62 50  BILITOT 1.8* 2.0*  PROT 7.0 5.8*  ALBUMIN 3.9 3.0*   CBC:  Recent Labs Lab 03/11/13 1405 03/11/13 2035 03/12/13 0415  WBC 22.6* 14.9* 13.7*  NEUTROABS 18.6*  --   --   HGB 15.8 14.1 14.2  HCT 45.0 39.6 40.0  MCV 82.1 81.6 83.7  PLT 169 135* 127*    Studies: Ct Abdomen Pelvis W Contrast  03/11/2013   CLINICAL DATA:  Left lower quadrant abdominal pain for 1 day  EXAM: CT ABDOMEN AND PELVIS WITH CONTRAST  TECHNIQUE: Multidetector CT imaging of the abdomen and  pelvis was performed using the standard protocol following bolus administration of intravenous contrast.  CONTRAST:  50mL OMNIPAQUE IOHEXOL 300 MG/ML SOLN, OMNIPAQUE IOHEXOL 300 MG/ML SOLN  COMPARISON:  None.  FINDINGS: Liver, gallbladder, spleen, pancreas, kidneys, and adrenal glands are normal. Abdominal aorta is normal.  There is a nonobstructive bowel gas pattern. There is mild to moderate diverticulosis of the distal descending colon and of the sigmoid colon there is mild diverticulosis. Patient appears to be status post partial resection and reanastomosis of sigmoid colon. There is mild inflammatory change in the mesenteric fat at the junction of the descending and sigmoid colon with mild colon wall thickening. Bladder and reproductive organs are normal. There is no ascites, abscess, or free air.  The visualized portions of the lung bases are clear. There are no acute musculoskeletal findings.  IMPRESSION: Mild acute diverticulitis   Electronically Signed   By: Esperanza Heir M.D.   On: 03/11/2013 15:49    Scheduled Meds: . ciprofloxacin  400 mg Intravenous Q12H  . enoxaparin (LOVENOX) injection  40 mg Subcutaneous Q24H  . metronidazole  500 mg Intravenous Q8H  . pantoprazole (PROTONIX) IV  40 mg Intravenous Q24H  . polyethylene glycol  17 g Oral BID   Continuous Infusions: . sodium chloride 125 mL/hr at 03/11/13 1948    Principal Problem:   Acute diverticulitis Active Problems:   GERD (gastroesophageal reflux disease)    Time spent: 40    Dorinda Hill, PA-S  [facility] Pager [phone] If 7PM-7AM, please contact night-coverage at [email or web address], password [facility] 03/12/2013, 9:44 AM  LOS: 1 day   Attending - Patient seen and examined, agree with the above assessment and plan. Above documentation reviewed, necessary changes have been made. Today, patient seems to be slightly better, still tender on the left lower quadrant area, but somewhat better than yesterday.  Leukocytosis has also improved, he was started on a clear liquid diet this morning, and seems to have tolerated well. We'll continue him on a clear liquid diet today and reassess him tomorrow morning.  Discussed with spouse at bedside.  Windell Norfolk MD

## 2013-03-12 NOTE — Progress Notes (Signed)
UR completed. Marra Fraga RN CCM Case Mgmt 

## 2013-03-13 LAB — BASIC METABOLIC PANEL
BUN: 10 mg/dL (ref 6–23)
Creatinine, Ser: 0.94 mg/dL (ref 0.50–1.35)
GFR calc Af Amer: >90 mL/min (ref >90)
GFR calc non Af Amer: 88 mL/min — ABNORMAL LOW (ref >90)
Glucose, Bld: 90 mg/dL (ref 70–99)
Potassium: 4.6 mEq/L (ref 3.5–5.1)

## 2013-03-13 LAB — CBC WITH DIFFERENTIAL/PLATELET
Basophils Absolute: 0 10*3/uL (ref 0.0–0.1)
Basophils Relative: 0 % (ref 0–1)
Eosinophils Relative: 2 % (ref 0–5)
Hemoglobin: 13.7 g/dL (ref 13.0–17.0)
Lymphocytes Relative: 11 % — ABNORMAL LOW (ref 12–46)
Lymphs Abs: 1.1 10*3/uL (ref 0.7–4.0)
MCHC: 36.1 g/dL — ABNORMAL HIGH (ref 30.0–36.0)
Neutro Abs: 8.2 10*3/uL — ABNORMAL HIGH (ref 1.7–7.7)
Neutrophils Relative %: 79 % — ABNORMAL HIGH (ref 43–77)
Platelets: 120 10*3/uL — ABNORMAL LOW (ref 150–400)
RBC: 4.57 MIL/uL (ref 4.22–5.81)
RDW: 13 % (ref 11.5–15.5)
WBC: 10.3 10*3/uL (ref 4.0–10.5)

## 2013-03-13 MED ORDER — CIPROFLOXACIN HCL 750 MG PO TABS: 750.0000 mg | ORAL_TABLET | Freq: Two times a day (BID) | ORAL | Status: DC

## 2013-03-13 MED ORDER — OXYCODONE HCL 5 MG PO TABS: 5.0000 mg | ORAL_TABLET | ORAL | Status: DC | PRN

## 2013-03-13 MED ORDER — METRONIDAZOLE 500 MG PO TABS: 500.0000 mg | ORAL_TABLET | Freq: Three times a day (TID) | ORAL | Status: DC

## 2013-03-13 MED ORDER — ONDANSETRON HCL 4 MG PO TABS: 4.0000 mg | ORAL_TABLET | Freq: Three times a day (TID) | ORAL | Status: DC | PRN

## 2013-03-13 NOTE — Discharge Summary (Signed)
Physician Discharge Summary  Derek Cardenas [id number] DOB: [birth year] DOA: 03/11/2013  PCP: Delorse Lek, MD  Admit date: 03/11/2013 Discharge date: 03/13/2013  Time spent: 50 minutes  Recommendations for Outpatient Follow-up:  Follow up with Dr. Randa Evens for further evaluation. CBC to monitor platelets in 1 week.  Discharge Diagnoses:  Principal Problem:   Acute diverticulitis Active Problems:   GERD (gastroesophageal reflux disease)   Discharge Condition: stable.  Diet recommendation: soft solids.  Filed Weights   03/11/13 1229 03/11/13 1858  Weight: 72.576 kg (160 lb) 71.4 kg (157 lb 6.5 oz)    History of present illness:  Derek Cardenas is a 62 y.o. male with a history of multiple episodes of diverticulitis one of them requiring partial colectomy due to perforation in the 1990s.  He presented with left sided abdominal pain.  Patient apparently was in his usual state of health until the evening prior to admission around 9 PM when he noticed that he was having some left lower quadrant abdominal pain.  Pain was described as sharp, with no radiation. There is no associated fever, nausea, vomiting or diarrhea. In the ED,  CT scan of the abdomen showed mild left-sided diverticulitis, a CBC showed significant leukocytosis.   Hospital Course:  1.Acute diverticulitis  - CT scan showed diverticulitis on left side. WBC peaked at 22,000 but has now normalized. -Started on IV with Cipro/Flagyl, and bowel rest. Diet slowly advanced, by day of discharge he was able to tolerate a Dysphagia 3 diet. Belly remains soft, LLQ pain improved significantly, but some mild pain persists. He will be discharged on the same for a total 10 more day course of therapy. - His diet has been slowly progressed to soft solids.  - Still having some pain particularly with palpation, but Derek Cardenas feels as though he can continue to recuperate at home.He has been advised that if severe abdominal pain, persistent  vomiting or fever returns he is to seek immediate medical attention.  2. GERD (gastroesophageal reflux disease)  - On PPI  3.  Thrombocytopenia - Platelets have dropped from 169 to 120 during this hospitaliation. - Likely due to antibiotics - Uncertain of baseline. - Will as PCP to monitor.   Discharge Exam: Filed Vitals:   03/13/13 0720  BP: 116/77  Pulse: 76  Temp: 98.8 F (37.1 C)  Resp: 24    General: A&O, NAD, Lying comfortably in bed Cardiovascular: rrr no m/r/g, no lower ext edema Respiratory: cta no w/c/r  Abdomen:  Soft, + tender to palpation in LLQ, + guarding, +bsounds Extremities:  5/5 strength in each, able to ambulate.  Discharge Instructions     Medication List         ciprofloxacin 750 MG tablet  Commonly known as:  CIPRO  Take 1 tablet (750 mg total) by mouth 2 (two) times daily.     metroNIDAZOLE 500 MG tablet  Commonly known as:  FLAGYL  Take 1 tablet (500 mg total) by mouth 3 (three) times daily.     omeprazole 40 MG capsule  Commonly known as:  PRILOSEC  Take 40 mg by mouth daily.     oxyCODONE 5 MG immediate release tablet  Commonly known as:  Oxy IR/ROXICODONE  Take 1 tablet (5 mg total) by mouth every 4 (four) hours as needed for moderate pain.       No Known Allergies Follow-up Information   Follow up with BURNETT,BRENT A, MD. Schedule an appointment as soon as possible for a visit in  1 week.   Specialty:  Family Medicine   Contact information:   [address] [place] [address] [phone]       Follow up with EDWARDS JR,JAMES L, MD. Schedule an appointment as soon as possible for a visit in 2 weeks.   Specialty:  Gastroenterology   Contact information:   [address]                          [place] [address] [phone]        The results of significant diagnostics from this hospitalization (including imaging, microbiology, ancillary and laboratory) are listed below for  reference.    Significant Diagnostic Studies: Ct Abdomen Pelvis W Contrast  03/11/2013   CLINICAL DATA:  Left lower quadrant abdominal pain for 1 day  EXAM: CT ABDOMEN AND PELVIS WITH CONTRAST  TECHNIQUE: Multidetector CT imaging of the abdomen and pelvis was performed using the standard protocol following bolus administration of intravenous contrast.  CONTRAST:  50mL OMNIPAQUE IOHEXOL 300 MG/ML SOLN, OMNIPAQUE IOHEXOL 300 MG/ML SOLN  COMPARISON:  None.  FINDINGS: Liver, gallbladder, spleen, pancreas, kidneys, and adrenal glands are normal. Abdominal aorta is normal.  There is a nonobstructive bowel gas pattern. There is mild to moderate diverticulosis of the distal descending colon and of the sigmoid colon there is mild diverticulosis. Patient appears to be status post partial resection and reanastomosis of sigmoid colon. There is mild inflammatory change in the mesenteric fat at the junction of the descending and sigmoid colon with mild colon wall thickening. Bladder and reproductive organs are normal. There is no ascites, abscess, or free air.  The visualized portions of the lung bases are clear. There are no acute musculoskeletal findings.  IMPRESSION: Mild acute diverticulitis   Electronically Signed   By: Esperanza Heir M.D.   On: 03/11/2013 15:49     Labs: Basic Metabolic Panel:  Recent Labs Lab 03/11/13 1405 03/11/13 2035 03/12/13 0415 03/13/13 0700  NA 137  --  136 130*  K 3.9  --  3.8 4.6  CL 100  --  104 102  CO2 26  --  22 14*  GLUCOSE 101*  --  88 90  BUN 19  --  16 10  CREATININE 1.20 1.08 1.15 0.94  CALCIUM 8.9  --  8.0* 7.7*   Liver Function Tests:  Recent Labs Lab 03/11/13 1405 03/12/13 0415  AST 22 15  ALT 20 13  ALKPHOS 62 50  BILITOT 1.8* 2.0*  PROT 7.0 5.8*  ALBUMIN 3.9 3.0*   CBC:  Recent Labs Lab 03/11/13 1405 03/11/13 2035 03/12/13 0415 03/13/13 0843  WBC 22.6* 14.9* 13.7* 10.3  NEUTROABS 18.6*  --   --  8.2*  HGB 15.8 14.1 14.2 13.7   HCT 45.0 39.6 40.0 37.9*  MCV 82.1 81.6 83.7 82.9  PLT 169 135* 127* 120*       Signed:  Conley Canal [phone] [facility] 03/13/2013, 10:56 AM  Attending Patient seen and examined, agree with the above assessment and plan. Much improved, diet has now been advanced to dysphagia 3 diet-he has tolerated breakfast and lunch very well. Belly soft-very mild LLQ tenderness present-but so much better than on admission. Stable for discharge.  Windell Norfolk MD

## 2013-03-13 NOTE — Care Management Note (Signed)
    Page 1 of 1   03/13/2013     5:27:30 PM   CARE MANAGEMENT NOTE 03/13/2013  Patient:  BRANON, SABINE   Account Number:  [id number]  Date Initiated:  03/13/2013  Documentation initiated by:  Letha Cape  Subjective/Objective Assessment:   dx acute diverticulitis  admit- lives with spouse.     Action/Plan:   Anticipated DC Date:  03/13/2013   Anticipated DC Plan:  HOME/SELF CARE      DC Planning Services  CM consult      Choice offered to / List presented to:             Status of service:  Completed, signed off Medicare Important Message given?   (If response is "NO", the following Medicare IM given date fields will be blank) Date Medicare IM given:   Date Additional Medicare IM given:    Discharge Disposition:  HOME/SELF CARE  Per UR Regulation:  Reviewed for med. necessity/level of care/duration of stay  If discussed at Long Length of Stay Meetings, dates discussed:    Comments:  03/13/13 10:56 Letha Cape RN, BSN [phone] patient llives with spouse, advancing diet to d3, if patient tolerates his lunch w/out pain, then he can dc home today, if not will not dc today.  No needs anticipated.

## 2013-03-13 NOTE — Progress Notes (Signed)
Nsg Discharge Note  Admit Date:  03/11/2013 Discharge date: 03/13/2013   Isa Kohlenberg to be D/C'd Home per MD order.  AVS completed.  Copy for chart, and copy for patient signed, and dated. Patient/caregiver able to verbalize understanding.  Discharge Medication:   Medication List         ciprofloxacin 750 MG tablet  Commonly known as:  CIPRO  Take 1 tablet (750 mg total) by mouth 2 (two) times daily.     metroNIDAZOLE 500 MG tablet  Commonly known as:  FLAGYL  Take 1 tablet (500 mg total) by mouth 3 (three) times daily.     omeprazole 40 MG capsule  Commonly known as:  PRILOSEC  Take 40 mg by mouth daily.     ondansetron 4 MG tablet  Commonly known as:  ZOFRAN  Take 1 tablet (4 mg total) by mouth every 8 (eight) hours as needed for nausea or vomiting.     oxyCODONE 5 MG immediate release tablet  Commonly known as:  Oxy IR/ROXICODONE  Take 1 tablet (5 mg total) by mouth every 4 (four) hours as needed for moderate pain.        Discharge Assessment: Filed Vitals:   03/13/13 1351  BP: 120/78  Pulse: 75  Temp: 98.3 F (36.8 C)  Resp: 18   Skin clean, dry and intact without evidence of skin break down, no evidence of skin tears noted. IV catheter discontinued intact. Site without signs and symptoms of complications - no redness or edema noted at insertion site, patient denies c/o pain - only slight tenderness at site.  Dressing with slight pressure applied.  D/c Instructions-Education: Discharge instructions given to patient/family with verbalized understanding. D/c education completed with patient/family including follow up instructions, medication list, d/c activities limitations if indicated, with other d/c instructions as indicated by MD - patient able to verbalize understanding, all questions fully answered. Patient instructed to return to ED, call 911, or call MD for any changes in condition.  Patient escorted via WC, and D/C home via private auto.  Kern Reap, RN 03/13/2013 3:46 PM

## 2016-01-17 ENCOUNTER — Ambulatory Visit (INDEPENDENT_AMBULATORY_CARE_PROVIDER_SITE_OTHER): Payer: BLUE CROSS/BLUE SHIELD

## 2016-01-17 DIAGNOSIS — Z23 Encounter for immunization: Secondary | ICD-10-CM | POA: Diagnosis not present

## 2019-05-09 ENCOUNTER — Ambulatory Visit: Payer: Medicare Other | Attending: Internal Medicine

## 2019-05-09 DIAGNOSIS — Z23 Encounter for immunization: Secondary | ICD-10-CM | POA: Insufficient documentation

## 2019-05-27 ENCOUNTER — Ambulatory Visit: Payer: Medicare Other | Attending: Internal Medicine

## 2019-05-27 DIAGNOSIS — Z23 Encounter for immunization: Secondary | ICD-10-CM | POA: Insufficient documentation

## 2019-05-27 NOTE — Progress Notes (Signed)
   Covid-19 Vaccination Clinic  Name:  Derek Cardenas    MRN: [id number] DOB: 03/10/1951  05/27/2019  Derek Cardenas was observed post Covid-19 immunization for 15 minutes without incidence. He was provided with Vaccine Information Sheet and instruction to access the V-Safe system.   Derek Cardenas was instructed to call 911 with any severe reactions post vaccine: Marland Kitchen Difficulty breathing  . Swelling of your face and throat  . A fast heartbeat  . A bad rash all over your body  . Dizziness and weakness    Immunizations Administered    Name Date Dose VIS Date Route   Pfizer COVID-19 Vaccine 05/27/2019  8:15 AM 0.3 mL 03/30/2019 Intramuscular   Manufacturer: [place]   Lot: [id number]   [place]: [id number]

## 2020-08-06 ENCOUNTER — Other Ambulatory Visit: Payer: Self-pay

## 2020-08-06 ENCOUNTER — Inpatient Hospital Stay (HOSPITAL_COMMUNITY): Payer: Medicare Other | Admitting: Certified Registered Nurse Anesthetist

## 2020-08-06 ENCOUNTER — Encounter (HOSPITAL_COMMUNITY): Admission: EM | Disposition: A | Discharge status: Home / Self Care | Payer: Self-pay | Source: Home / Self Care

## 2020-08-06 ENCOUNTER — Emergency Department (HOSPITAL_BASED_OUTPATIENT_CLINIC_OR_DEPARTMENT_OTHER): Payer: Medicare Other

## 2020-08-06 ENCOUNTER — Inpatient Hospital Stay (HOSPITAL_BASED_OUTPATIENT_CLINIC_OR_DEPARTMENT_OTHER)
Admission: EM | Admit: 2020-08-06 | Discharge: 2020-08-10 | DRG: 339 | Disposition: A | Discharge status: Home / Self Care | Payer: Medicare Other | Attending: Orthopaedic Surgery | Admitting: Orthopaedic Surgery

## 2020-08-06 ENCOUNTER — Encounter (HOSPITAL_BASED_OUTPATIENT_CLINIC_OR_DEPARTMENT_OTHER): Payer: Self-pay | Admitting: *Deleted

## 2020-08-06 DIAGNOSIS — R21 Rash and other nonspecific skin eruption: Secondary | ICD-10-CM | POA: Diagnosis present

## 2020-08-06 DIAGNOSIS — K5790 Diverticulosis of intestine, part unspecified, without perforation or abscess without bleeding: Secondary | ICD-10-CM | POA: Diagnosis present

## 2020-08-06 DIAGNOSIS — Z9049 Acquired absence of other specified parts of digestive tract: Secondary | ICD-10-CM | POA: Diagnosis not present

## 2020-08-06 DIAGNOSIS — Z79899 Other long term (current) drug therapy: Secondary | ICD-10-CM | POA: Diagnosis not present

## 2020-08-06 DIAGNOSIS — K3521 Acute appendicitis with generalized peritonitis, with abscess: Principal | ICD-10-CM | POA: Diagnosis present

## 2020-08-06 DIAGNOSIS — K3580 Unspecified acute appendicitis: Secondary | ICD-10-CM | POA: Diagnosis present

## 2020-08-06 DIAGNOSIS — F17220 Nicotine dependence, chewing tobacco, uncomplicated: Secondary | ICD-10-CM | POA: Diagnosis present

## 2020-08-06 DIAGNOSIS — K4090 Unilateral inguinal hernia, without obstruction or gangrene, not specified as recurrent: Secondary | ICD-10-CM

## 2020-08-06 DIAGNOSIS — K219 Gastro-esophageal reflux disease without esophagitis: Secondary | ICD-10-CM | POA: Diagnosis present

## 2020-08-06 DIAGNOSIS — I10 Essential (primary) hypertension: Secondary | ICD-10-CM | POA: Diagnosis present

## 2020-08-06 DIAGNOSIS — K4030 Unilateral inguinal hernia, with obstruction, without gangrene, not specified as recurrent: Secondary | ICD-10-CM | POA: Diagnosis present

## 2020-08-06 DIAGNOSIS — K353 Acute appendicitis with localized peritonitis, without perforation or gangrene: Secondary | ICD-10-CM

## 2020-08-06 DIAGNOSIS — L509 Urticaria, unspecified: Secondary | ICD-10-CM

## 2020-08-06 DIAGNOSIS — N2889 Other specified disorders of kidney and ureter: Secondary | ICD-10-CM

## 2020-08-06 DIAGNOSIS — Z20822 Contact with and (suspected) exposure to covid-19: Secondary | ICD-10-CM | POA: Diagnosis present

## 2020-08-06 DIAGNOSIS — D62 Acute posthemorrhagic anemia: Secondary | ICD-10-CM | POA: Diagnosis not present

## 2020-08-06 HISTORY — DX: Essential (primary) hypertension: I10

## 2020-08-06 HISTORY — PX: LAPAROSCOPIC APPENDECTOMY: SHX408

## 2020-08-06 LAB — COMPREHENSIVE METABOLIC PANEL
ALT: 20 U/L (ref 0–44)
AST: 24 U/L (ref 15–41)
Albumin: 4.3 g/dL (ref 3.5–5.0)
Alkaline Phosphatase: 68 U/L (ref 38–126)
Anion gap: 13 (ref 5–15)
BUN: 17 mg/dL (ref 8–23)
CO2: 23 mmol/L (ref 22–32)
Calcium: 9.2 mg/dL (ref 8.9–10.3)
Chloride: 101 mmol/L (ref 98–111)
Creatinine, Ser: 1.14 mg/dL (ref 0.61–1.24)
GFR, Estimated: >60 mL/min (ref >60)
Glucose, Bld: 102 mg/dL — ABNORMAL HIGH (ref 70–99)
Potassium: 3.9 mmol/L (ref 3.5–5.1)
Sodium: 137 mmol/L (ref 135–145)
Total Bilirubin: 2.1 mg/dL — ABNORMAL HIGH (ref 0.3–1.2)
Total Protein: 7.3 g/dL (ref 6.5–8.1)

## 2020-08-06 LAB — CBC WITH DIFFERENTIAL/PLATELET
Abs Immature Granulocytes: 0.11 10*3/uL — ABNORMAL HIGH (ref 0.00–0.07)
Basophils Absolute: 0 10*3/uL (ref 0.0–0.1)
Basophils Relative: 0 %
Eosinophils Absolute: 0.1 10*3/uL (ref 0.0–0.5)
Eosinophils Relative: 1 %
HCT: 48.5 % (ref 39.0–52.0)
Hemoglobin: 16.7 g/dL (ref 13.0–17.0)
Immature Granulocytes: 1 %
Lymphocytes Relative: 9 %
Lymphs Abs: 1.7 10*3/uL (ref 0.7–4.0)
MCH: 28.4 pg (ref 26.0–34.0)
MCHC: 34.4 g/dL (ref 30.0–36.0)
MCV: 82.6 fL (ref 80.0–100.0)
Monocytes Absolute: 1.3 10*3/uL — ABNORMAL HIGH (ref 0.1–1.0)
Monocytes Relative: 7 %
Neutro Abs: 14.9 10*3/uL — ABNORMAL HIGH (ref 1.7–7.7)
Neutrophils Relative %: 82 %
Platelets: 224 10*3/uL (ref 150–400)
RBC: 5.87 MIL/uL — ABNORMAL HIGH (ref 4.22–5.81)
RDW: 13.2 % (ref 11.5–15.5)
WBC: 18.2 10*3/uL — ABNORMAL HIGH (ref 4.0–10.5)
nRBC: 0 % (ref 0.0–0.2)

## 2020-08-06 LAB — URINALYSIS, ROUTINE W REFLEX MICROSCOPIC
Bilirubin Urine: NEGATIVE
Glucose, UA: NEGATIVE mg/dL
Hgb urine dipstick: NEGATIVE
Ketones, ur: NEGATIVE mg/dL
Leukocytes,Ua: NEGATIVE
Nitrite: NEGATIVE
Specific Gravity, Urine: 1.023 (ref 1.005–1.030)
pH: 5.5 (ref 5.0–8.0)

## 2020-08-06 LAB — LIPASE, BLOOD: Lipase: 22 U/L (ref 11–51)

## 2020-08-06 LAB — RESP PANEL BY RT-PCR (FLU A&B, COVID) ARPGX2
Influenza A by PCR: NEGATIVE
Influenza B by PCR: NEGATIVE
SARS Coronavirus 2 by RT PCR: NEGATIVE

## 2020-08-06 IMAGING — CT CT ABD-PELV W/ CM
2 of 5 series · 14 of 46 positions shown, 16 images · IV contrast (APPLIED)
Comparison: CT the abdomen and pelvis [DATE].

CLINICAL DATA: 70-year-old male with history of right lower
quadrant abdominal pain since [REDACTED]. Suspected complicated
diverticulitis.

EXAM:
CT ABDOMEN AND PELVIS WITH CONTRAST
TECHNIQUE: Multidetector CT imaging of the abdomen and pelvis was performed
using the standard protocol following bolus administration of
intravenous contrast.
CONTRAST:  80mL OMNIPAQUE IOHEXOL 300 MG/ML  SOLN

[Series 2: abd pel w · axial · 0.69mm/px · z∈[+924,+1379]mm · 11 of 103 slices shown, 13 images]
[im 6/103  soft-tissue]
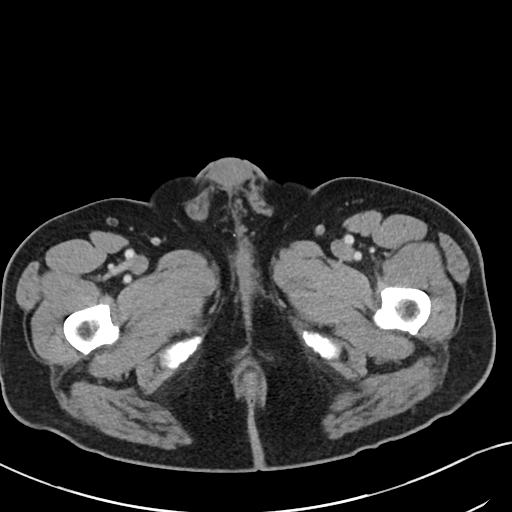
[im 6/103  bone]
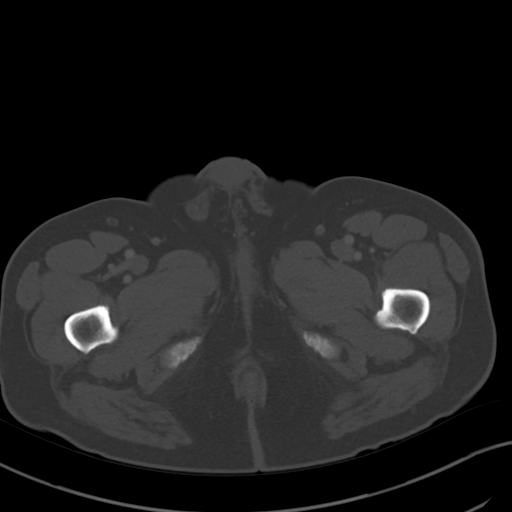
[im 17/103  soft-tissue]
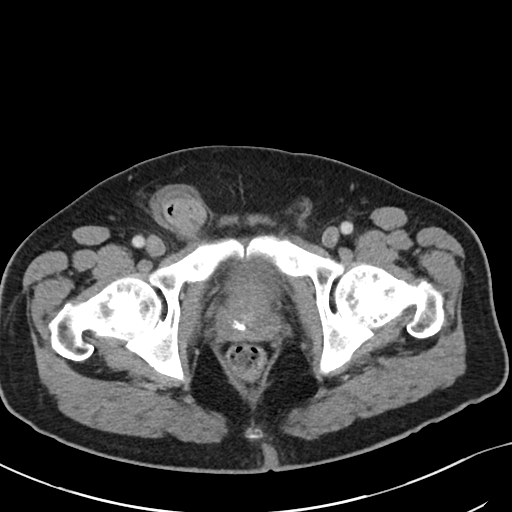
[im 27/103  soft-tissue]
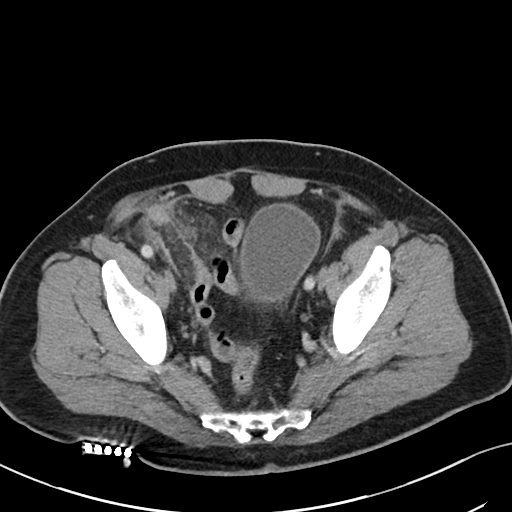
[im 33/103  soft-tissue]
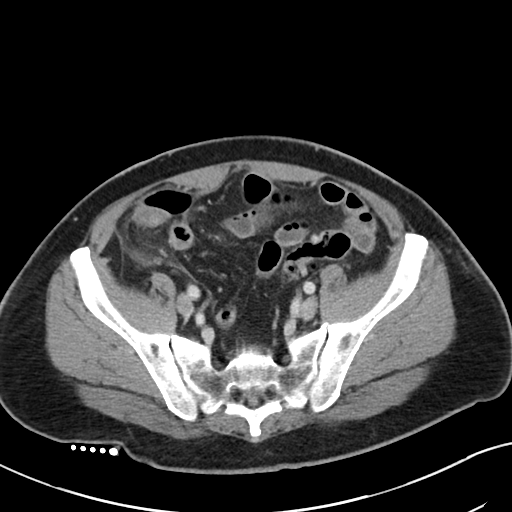
[im 43/103  soft-tissue]
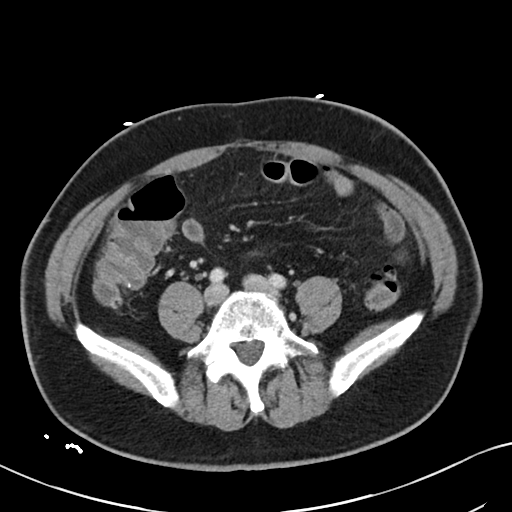
[im 54/103  soft-tissue]
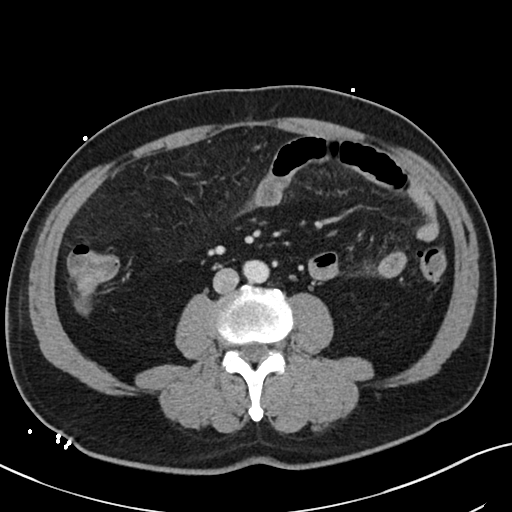
[im 60/103  soft-tissue]
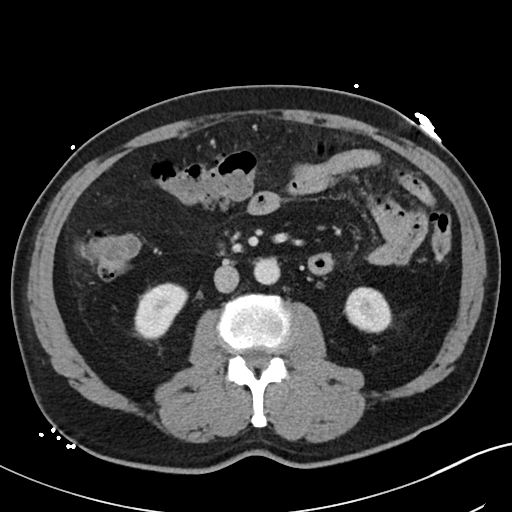
[im 70/103  soft-tissue]
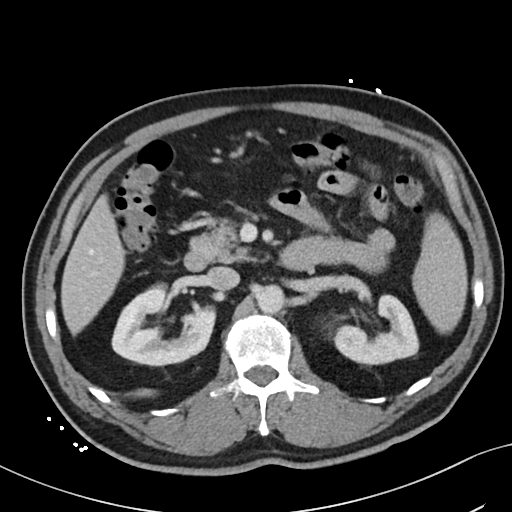
[im 76/103  soft-tissue]
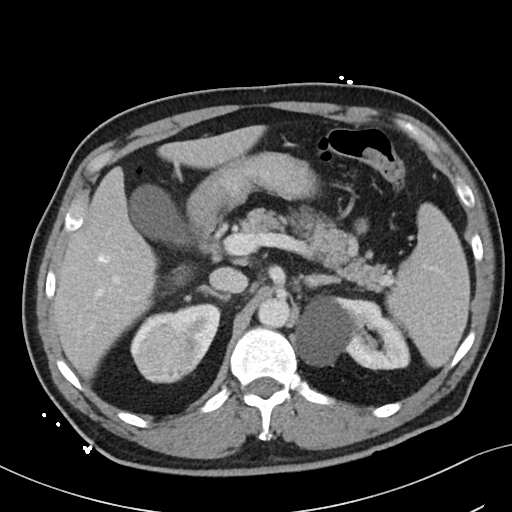
[im 76/103  bone]
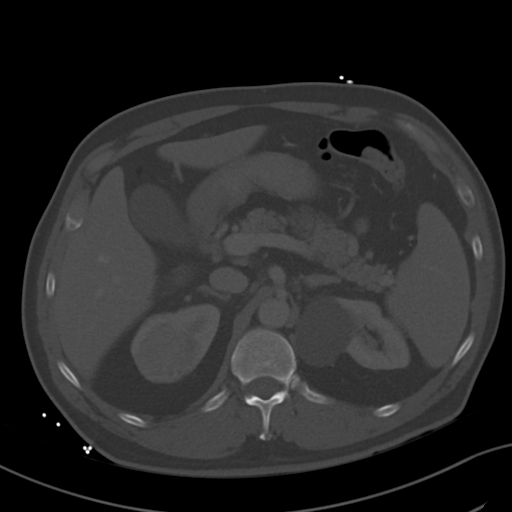
[im 86/103  soft-tissue]
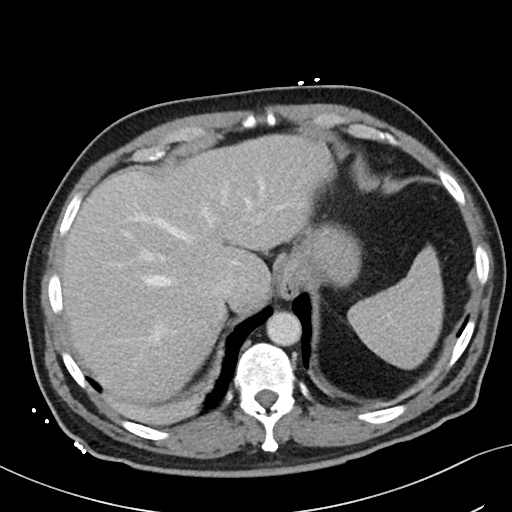
[im 97/103  soft-tissue]
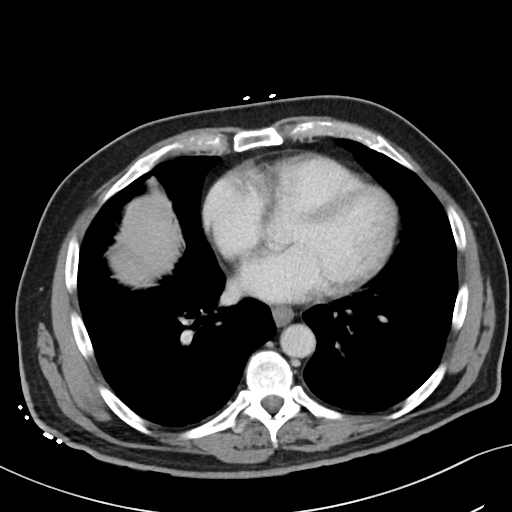

[Series 5: coronal · coronal · 0.73mm/px · 3 of 93 slices shown]
[im 31/93  soft-tissue]
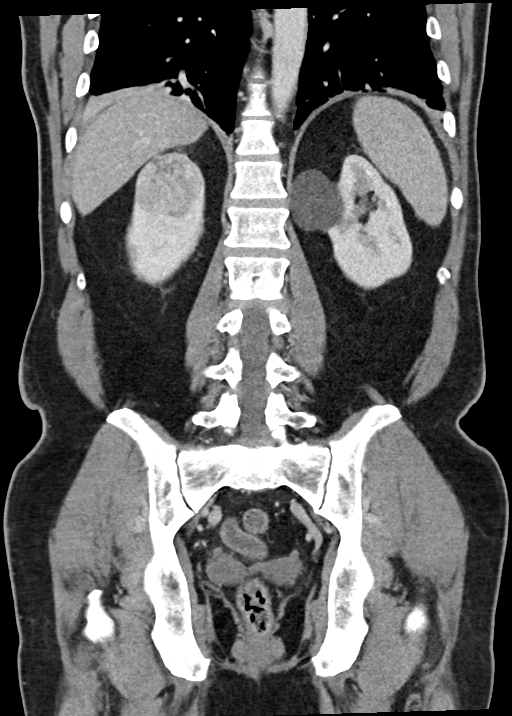
[im 41/93  soft-tissue]
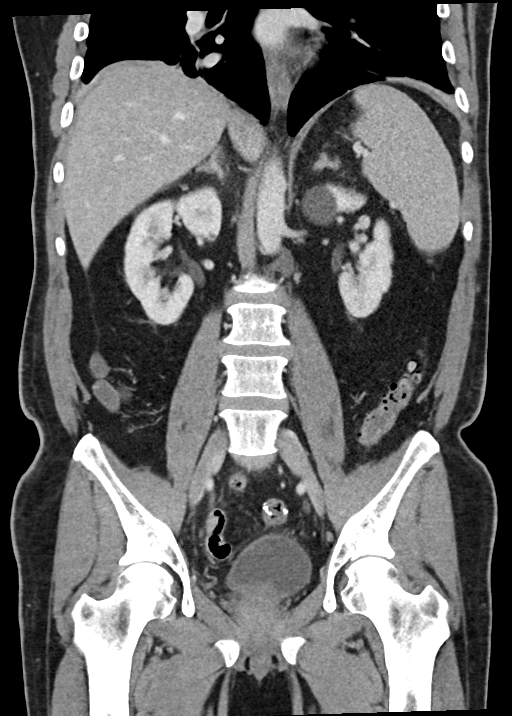
[im 52/93  soft-tissue]
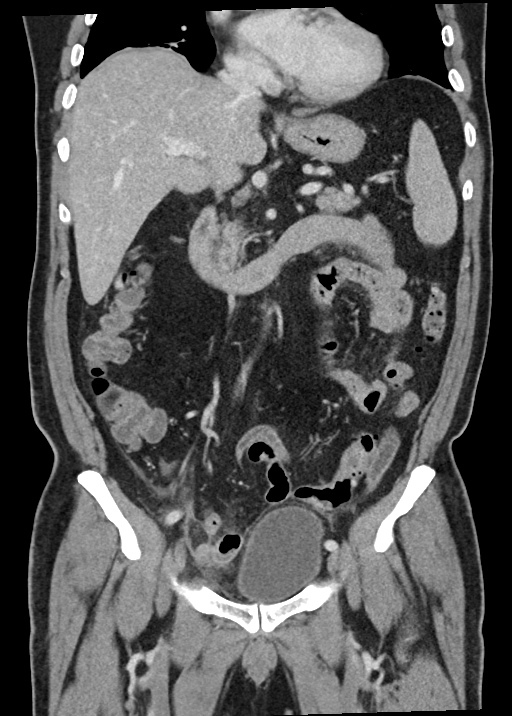

[14 of 46 positions shown; findings below may reference images not displayed]

FINDINGS: Lower chest: Unremarkable.

Hepatobiliary: No suspicious cystic or solid hepatic lesions. No
intra or extrahepatic biliary ductal dilatation. Gallbladder is
normal in appearance.

Pancreas: No pancreatic mass. No pancreatic ductal dilatation. No
pancreatic or peripancreatic fluid collections or inflammatory
changes.

Spleen: Unremarkable.

Adrenals/Urinary Tract: In the upper pole of the right kidney there
is a 4.5 x 4.3 x 4.6 cm heterogeneously enhancing mass which is
mildly exophytic but encapsulated within Gerota's fascia and well
separated from the right renal vein which is widely patent at this
time. In the medial aspect of the upper pole the left kidney there
is an exophytic 4.7 cm low-attenuation nonenhancing lesion,
compatible with a simple cyst. No hydroureteronephrosis. Urinary
bladder is normal in appearance.

Stomach/Bowel: The appearance of the stomach is normal. There is no
pathologic dilatation of small bowel or colon. Postoperative changes
of partial colectomy are noted in the region of the mid sigmoid
colon. A short segment of distal small bowel extends into a right
inguinal hernia. Colonic diverticulosis without surrounding
inflammatory changes to clearly indicate an acute diverticulitis.

Appendix: Location: Inferior to the cecum

Diameter: 13 mm

Appendicolith: None

Mucosal hyper-enhancement: Present

Extraluminal gas: None

Periappendiceal collection: Inflammatory changes are noted adjacent
to the appendix, without a well-defined periappendiceal fluid
collection to suggest abscess.

Vascular/Lymphatic: Aortic atherosclerosis, without evidence of
aneurysm or dissection in the abdominal or pelvic vasculature. No
lymphadenopathy noted in the abdomen or pelvis.

Reproductive: Prostate gland and seminal vesicles are unremarkable
in appearance.

Other: Inflammatory changes are noted adjacent to the appendix in
the right lower quadrant of the abdomen. Small right inguinal hernia
containing a short segment of distal small bowel, as well as a trace
volume of free fluid. No larger volume of ascites. No
pneumoperitoneum.

Musculoskeletal: There are no aggressive appearing lytic or blastic
lesions noted in the visualized portions of the skeleton.
IMPRESSION: 1. Findings are compatible with an acute appendicitis. There is
phlegmonous inflammation surrounding the appendix, but no
periappendiceal abscess or signs of frank perforation noted at this
time.
2. Small right inguinal hernia containing a short segment of distal
small bowel, without evidence of bowel incarceration or obstruction
at this time.
3. Colonic diverticulosis without evidence of acute diverticulitis
at this time.
4. 4.5 x 4.3 x 4.6 cm heterogeneously enhancing mass in the upper
pole of the right kidney which is encapsulated within Gerota's
fascia and separated from the right renal vein, highly concerning
for primary renal cell carcinoma. Nonemergent Urologic consultation
is strongly recommended.
5. Aortic atherosclerosis.

Critical Value/emergent results were called by telephone at the time
of interpretation on [DATE] at [DATE] to provider MARDIYANTO
MARDIYANTO, who verbally acknowledged these results.

## 2020-08-06 SURGERY — APPENDECTOMY, LAPAROSCOPIC
Anesthesia: General | Site: Abdomen

## 2020-08-06 MED ORDER — PIPERACILLIN-TAZOBACTAM 3.375 G IVPB 30 MIN
3.3750 g | Freq: Once | INTRAVENOUS | Status: AC
  Administered 2020-08-06: 3.375 g via INTRAVENOUS
  Filled 2020-08-06: qty 50

## 2020-08-06 MED ORDER — PANTOPRAZOLE SODIUM 40 MG IV SOLR
40.0000 mg | Freq: Every day | INTRAVENOUS | Status: DC
  Administered 2020-08-07: 40 mg via INTRAVENOUS
  Filled 2020-08-06: qty 40

## 2020-08-06 MED ORDER — SUGAMMADEX SODIUM 200 MG/2ML IV SOLN
INTRAVENOUS | Status: DC | PRN
  Administered 2020-08-06: 300 mg via INTRAVENOUS

## 2020-08-06 MED ORDER — GABAPENTIN 300 MG PO CAPS: 300.0000 mg | ORAL_CAPSULE | ORAL | Status: AC

## 2020-08-06 MED ORDER — ROCURONIUM BROMIDE 10 MG/ML (PF) SYRINGE
PREFILLED_SYRINGE | INTRAVENOUS | Status: DC | PRN
  Administered 2020-08-06: 30 mg via INTRAVENOUS
  Administered 2020-08-06 (×2): 20 mg via INTRAVENOUS

## 2020-08-06 MED ORDER — SODIUM CHLORIDE 0.9 % IV SOLN
INTRAVENOUS | Status: DC | PRN
  Administered 2020-08-06: 25 mL via INTRAVENOUS

## 2020-08-06 MED ORDER — CHLORHEXIDINE GLUCONATE 0.12 % MT SOLN
OROMUCOSAL | Status: AC
  Filled 2020-08-06: qty 15

## 2020-08-06 MED ORDER — ENOXAPARIN SODIUM 40 MG/0.4ML ~~LOC~~ SOLN
40.0000 mg | SUBCUTANEOUS | Status: DC
  Administered 2020-08-07 – 2020-08-09 (×3): 40 mg via SUBCUTANEOUS
  Filled 2020-08-06 (×4): qty 0.4

## 2020-08-06 MED ORDER — PHENYLEPHRINE 40 MCG/ML (10ML) SYRINGE FOR IV PUSH (FOR BLOOD PRESSURE SUPPORT)
PREFILLED_SYRINGE | INTRAVENOUS | Status: AC
  Filled 2020-08-06: qty 10

## 2020-08-06 MED ORDER — LIDOCAINE HCL 1 % IJ SOLN
INTRAMUSCULAR | Status: DC | PRN
  Administered 2020-08-06: 20 mL

## 2020-08-06 MED ORDER — DEXAMETHASONE SODIUM PHOSPHATE 10 MG/ML IJ SOLN
INTRAMUSCULAR | Status: AC
  Filled 2020-08-06: qty 1

## 2020-08-06 MED ORDER — PIPERACILLIN-TAZOBACTAM 3.375 G IVPB
3.3750 g | Freq: Three times a day (TID) | INTRAVENOUS | Status: DC
  Administered 2020-08-06 – 2020-08-10 (×11): 3.375 g via INTRAVENOUS
  Filled 2020-08-06 (×10): qty 50

## 2020-08-06 MED ORDER — FENTANYL CITRATE (PF) 250 MCG/5ML IJ SOLN
INTRAMUSCULAR | Status: AC
  Filled 2020-08-06: qty 5

## 2020-08-06 MED ORDER — PROPOFOL 10 MG/ML IV BOLUS
INTRAVENOUS | Status: DC | PRN
  Administered 2020-08-06: 140 mg via INTRAVENOUS

## 2020-08-06 MED ORDER — LACTATED RINGERS IV SOLN: INTRAVENOUS | Status: DC

## 2020-08-06 MED ORDER — MORPHINE SULFATE (PF) 2 MG/ML IV SOLN: 2.0000 mg | INTRAVENOUS | Status: DC | PRN

## 2020-08-06 MED ORDER — ACETAMINOPHEN 325 MG PO TABS
650.0000 mg | ORAL_TABLET | Freq: Four times a day (QID) | ORAL | Status: DC | PRN
  Filled 2020-08-06: qty 2

## 2020-08-06 MED ORDER — ONDANSETRON HCL 4 MG/2ML IJ SOLN
INTRAMUSCULAR | Status: AC
  Filled 2020-08-06: qty 2

## 2020-08-06 MED ORDER — DIPHENHYDRAMINE HCL 50 MG/ML IJ SOLN: 12.5000 mg | Freq: Four times a day (QID) | INTRAMUSCULAR | Status: DC | PRN

## 2020-08-06 MED ORDER — FENTANYL CITRATE (PF) 100 MCG/2ML IJ SOLN: 25.0000 ug | INTRAMUSCULAR | Status: DC | PRN

## 2020-08-06 MED ORDER — ONDANSETRON 4 MG PO TBDP: 4.0000 mg | ORAL_TABLET | Freq: Four times a day (QID) | ORAL | Status: DC | PRN

## 2020-08-06 MED ORDER — ACETAMINOPHEN 500 MG PO TABS
1000.0000 mg | ORAL_TABLET | Freq: Once | ORAL | Status: AC
  Administered 2020-08-06: 1000 mg via ORAL
  Filled 2020-08-06: qty 2

## 2020-08-06 MED ORDER — CHLORHEXIDINE GLUCONATE 0.12 % MT SOLN
15.0000 mL | OROMUCOSAL | Status: AC
  Filled 2020-08-06: qty 15

## 2020-08-06 MED ORDER — SODIUM CHLORIDE 0.9 % IV BOLUS
1000.0000 mL | Freq: Once | INTRAVENOUS | Status: AC
  Administered 2020-08-06: 1000 mL via INTRAVENOUS

## 2020-08-06 MED ORDER — DIPHENHYDRAMINE HCL 12.5 MG/5ML PO ELIX: 12.5000 mg | ORAL_SOLUTION | Freq: Four times a day (QID) | ORAL | Status: DC | PRN

## 2020-08-06 MED ORDER — BUPIVACAINE-EPINEPHRINE (PF) 0.25% -1:200000 IJ SOLN
INTRAMUSCULAR | Status: AC
  Filled 2020-08-06: qty 30

## 2020-08-06 MED ORDER — DEXAMETHASONE SODIUM PHOSPHATE 10 MG/ML IJ SOLN
INTRAMUSCULAR | Status: DC | PRN
  Administered 2020-08-06: 10 mg via INTRAVENOUS

## 2020-08-06 MED ORDER — ONDANSETRON HCL 4 MG/2ML IJ SOLN: 4.0000 mg | Freq: Four times a day (QID) | INTRAMUSCULAR | Status: DC | PRN

## 2020-08-06 MED ORDER — SIMETHICONE 80 MG PO CHEW: 40.0000 mg | CHEWABLE_TABLET | Freq: Four times a day (QID) | ORAL | Status: DC | PRN

## 2020-08-06 MED ORDER — DIPHENHYDRAMINE HCL 25 MG PO CAPS
25.0000 mg | ORAL_CAPSULE | Freq: Once | ORAL | Status: AC
  Administered 2020-08-06: 25 mg via ORAL
  Filled 2020-08-06: qty 1

## 2020-08-06 MED ORDER — SODIUM CHLORIDE 0.9 % IV SOLN: INTRAVENOUS | Status: DC

## 2020-08-06 MED ORDER — ACETAMINOPHEN 500 MG PO TABS
1000.0000 mg | ORAL_TABLET | ORAL | Status: DC
  Filled 2020-08-06: qty 2

## 2020-08-06 MED ORDER — ROCURONIUM BROMIDE 10 MG/ML (PF) SYRINGE
PREFILLED_SYRINGE | INTRAVENOUS | Status: AC
  Filled 2020-08-06: qty 10

## 2020-08-06 MED ORDER — IOHEXOL 300 MG/ML  SOLN
80.0000 mL | Freq: Once | INTRAMUSCULAR | Status: AC | PRN
  Administered 2020-08-06: 80 mL via INTRAVENOUS

## 2020-08-06 MED ORDER — METHOCARBAMOL 500 MG PO TABS
500.0000 mg | ORAL_TABLET | Freq: Four times a day (QID) | ORAL | Status: DC | PRN
  Administered 2020-08-10: 500 mg via ORAL
  Filled 2020-08-06: qty 1

## 2020-08-06 MED ORDER — LIDOCAINE HCL 1 % IJ SOLN
INTRAMUSCULAR | Status: AC
  Filled 2020-08-06: qty 20

## 2020-08-06 MED ORDER — POLYETHYLENE GLYCOL 3350 17 G PO PACK: 17.0000 g | PACK | Freq: Every day | ORAL | Status: DC | PRN

## 2020-08-06 MED ORDER — ACETAMINOPHEN 650 MG RE SUPP: 650.0000 mg | Freq: Four times a day (QID) | RECTAL | Status: DC | PRN

## 2020-08-06 MED ORDER — FENTANYL CITRATE (PF) 250 MCG/5ML IJ SOLN
INTRAMUSCULAR | Status: DC | PRN
  Administered 2020-08-06 (×3): 50 ug via INTRAVENOUS
  Administered 2020-08-06: 100 ug via INTRAVENOUS

## 2020-08-06 MED ORDER — SUCCINYLCHOLINE CHLORIDE 200 MG/10ML IV SOSY
PREFILLED_SYRINGE | INTRAVENOUS | Status: DC | PRN
  Administered 2020-08-06: 150 mg via INTRAVENOUS

## 2020-08-06 MED ORDER — OXYCODONE HCL 5 MG PO TABS: 5.0000 mg | ORAL_TABLET | ORAL | Status: DC | PRN

## 2020-08-06 MED ORDER — PHENYLEPHRINE HCL-NACL 10-0.9 MG/250ML-% IV SOLN
INTRAVENOUS | Status: DC | PRN
  Administered 2020-08-06: 20 ug/min via INTRAVENOUS

## 2020-08-06 MED ORDER — LIDOCAINE 2% (20 MG/ML) 5 ML SYRINGE
INTRAMUSCULAR | Status: DC | PRN
  Administered 2020-08-06: 100 mg via INTRAVENOUS

## 2020-08-06 MED ORDER — 0.9 % SODIUM CHLORIDE (POUR BTL) OPTIME
TOPICAL | Status: DC | PRN
  Administered 2020-08-06: 1000 mL

## 2020-08-06 MED ORDER — METOPROLOL TARTRATE 5 MG/5ML IV SOLN
5.0000 mg | Freq: Four times a day (QID) | INTRAVENOUS | Status: DC | PRN
Start: 2020-08-06 — End: 2020-08-10

## 2020-08-06 MED ORDER — SODIUM CHLORIDE 0.9 % IR SOLN
Status: DC | PRN
  Administered 2020-08-06: 1000 mL

## 2020-08-06 SURGICAL SUPPLY — 59 items
ADH SKN CLS APL DERMABOND .7 (GAUZE/BANDAGES/DRESSINGS) ×1
APL PRP STRL LF DISP 70% ISPRP (MISCELLANEOUS) ×1
APPLIER CLIP ROT 10 11.4 M/L (STAPLE)
APR CLP MED LRG 11.4X10 (STAPLE)
BAG SPEC RTRVL 10 TROC 200 (ENDOMECHANICALS) ×1
BIOPATCH RED 1 DISK 7.0 (GAUZE/BANDAGES/DRESSINGS) ×1 IMPLANT
BLADE CLIPPER SURG (BLADE) IMPLANT
CANISTER SUCT 3000ML PPV (MISCELLANEOUS) ×2 IMPLANT
CHLORAPREP W/TINT 26 (MISCELLANEOUS) ×2 IMPLANT
CLIP APPLIE ROT 10 11.4 M/L (STAPLE) IMPLANT
COVER SURGICAL LIGHT HANDLE (MISCELLANEOUS) ×2 IMPLANT
CUTTER FLEX LINEAR 45M (STAPLE) ×2 IMPLANT
DECANTER SPIKE VIAL GLASS SM (MISCELLANEOUS) ×2 IMPLANT
DERMABOND ADVANCED (GAUZE/BANDAGES/DRESSINGS) ×1
DERMABOND ADVANCED .7 DNX12 (GAUZE/BANDAGES/DRESSINGS) ×1 IMPLANT
DRAIN CHANNEL 19F RND (DRAIN) ×1 IMPLANT
DRAPE WARM FLUID 44X44 (DRAPES) ×2 IMPLANT
DRSG TEGADERM 2-3/8X2-3/4 SM (GAUZE/BANDAGES/DRESSINGS) ×1 IMPLANT
ELECT REM PT RETURN 9FT ADLT (ELECTROSURGICAL) ×2
ELECTRODE REM PT RTRN 9FT ADLT (ELECTROSURGICAL) ×1 IMPLANT
ENDOLOOP SUT PDS II  0 18 (SUTURE)
ENDOLOOP SUT PDS II 0 18 (SUTURE) IMPLANT
EVACUATOR SILICONE 100CC (DRAIN) ×1 IMPLANT
GLOVE BIO SURGEON STRL SZ 6 (GLOVE) ×2 IMPLANT
GLOVE SURG UNDER LTX SZ6.5 (GLOVE) ×2 IMPLANT
GOWN STRL REUS W/ TWL LRG LVL3 (GOWN DISPOSABLE) ×2 IMPLANT
GOWN STRL REUS W/TWL 2XL LVL3 (GOWN DISPOSABLE) ×2 IMPLANT
GOWN STRL REUS W/TWL LRG LVL3 (GOWN DISPOSABLE) ×4
KIT BASIN OR (CUSTOM PROCEDURE TRAY) ×2 IMPLANT
KIT TURNOVER KIT B (KITS) ×2 IMPLANT
L-HOOK LAP DISP 36CM (ELECTROSURGICAL) ×2
LHOOK LAP DISP 36CM (ELECTROSURGICAL) ×1 IMPLANT
NS IRRIG 1000ML POUR BTL (IV SOLUTION) ×2 IMPLANT
PAD ARMBOARD 7.5X6 YLW CONV (MISCELLANEOUS) ×4 IMPLANT
PENCIL BUTTON HOLSTER BLD 10FT (ELECTRODE) ×2 IMPLANT
POUCH RETRIEVAL ECOSAC 10 (ENDOMECHANICALS) ×1 IMPLANT
POUCH RETRIEVAL ECOSAC 10MM (ENDOMECHANICALS) ×2
RELOAD STAPLE 45 3.5 BLU ETS (ENDOMECHANICALS) ×1 IMPLANT
RELOAD STAPLE TA45 3.5 REG BLU (ENDOMECHANICALS) ×2 IMPLANT
SCISSORS LAP 5X35 DISP (ENDOMECHANICALS) ×1 IMPLANT
SET IRRIG TUBING LAPAROSCOPIC (IRRIGATION / IRRIGATOR) ×2 IMPLANT
SET TUBE SMOKE EVAC HIGH FLOW (TUBING) ×2 IMPLANT
SHEARS HARMONIC HDI 36CM (ELECTROSURGICAL) ×2 IMPLANT
SLEEVE ENDOPATH XCEL 5M (ENDOMECHANICALS) ×3 IMPLANT
SPECIMEN JAR SMALL (MISCELLANEOUS) ×2 IMPLANT
SUT MNCRL AB 4-0 PS2 18 (SUTURE) ×2 IMPLANT
SUT VICRYL 0 UR6 27IN ABS (SUTURE) ×1 IMPLANT
TOWEL GREEN STERILE (TOWEL DISPOSABLE) ×2 IMPLANT
TOWEL GREEN STERILE FF (TOWEL DISPOSABLE) ×2 IMPLANT
TRAY FOL W/BAG SLVR 16FR STRL (SET/KITS/TRAYS/PACK) IMPLANT
TRAY FOLEY W/BAG SLVR 14FR (SET/KITS/TRAYS/PACK) ×1 IMPLANT
TRAY FOLEY W/BAG SLVR 16FR (SET/KITS/TRAYS/PACK) ×2
TRAY FOLEY W/BAG SLVR 16FR LF (SET/KITS/TRAYS/PACK) ×2
TRAY FOLEY W/BAG SLVR 16FR ST (SET/KITS/TRAYS/PACK) ×1 IMPLANT
TRAY LAPAROSCOPIC MC (CUSTOM PROCEDURE TRAY) ×2 IMPLANT
TROCAR XCEL BLUNT TIP 100MML (ENDOMECHANICALS) ×2 IMPLANT
TROCAR XCEL NON-BLD 5MMX100MML (ENDOMECHANICALS) ×2 IMPLANT
WARMER LAPAROSCOPE (MISCELLANEOUS) ×2 IMPLANT
WATER STERILE IRR 1000ML POUR (IV SOLUTION) ×2 IMPLANT

## 2020-08-06 NOTE — Op Note (Signed)
Appendectomy, Lap, Procedure Note  Indications: The patient presented with a history of right-sided abdominal pain. A CT revealed findings consistent with acute appendicitis, with phlegmonous changes and possible perforation  Pre-operative Diagnosis: acute appendicitis, right inguinal hernia  Post-operative Diagnosis: acute appendicitis with generalized peritonitis and abscess, right inguinal hernia  Surgeon: Stark Klein   Assistants: n/a  Anesthesia: General endotracheal anesthesia and Local anesthesia marcaine/lidocaine  ASA Class: 2, E  Procedure Details  The patient was seen again in the Holding Room. The risks, benefits, complications, treatment options, and expected outcomes were discussed with the patient and/or family. The possibilities of perforation of viscus, bleeding, recurrent infection, the need for additional procedures, failure to diagnose a condition, and creating a complication requiring transfusion or operation were discussed. There was concurrence with the proposed plan and informed consent was obtained. The site of surgery was properly noted. The patient was taken to Operating Room, identified as Derek Cardenas and the procedure verified as A ppendectomy. A Time Out was held and the above information confirmed.  The patient was placed in the supine position and general anesthesia was induced, along with placement of orogastric tube, Venodyne boots, and a Foley catheter. The abdomen was prepped and draped in a sterile fashion. Local anesthetic was infiltrated in the supraumbilical region at the top of his prior midline incision.  A 1.5 cm vertical incision was made.  The Kelly clamp was used to spread the subcutaneous tissues.  The fascia was elevated with 2 Kocher clamps and incised with the #11 blade.  A Claiborne Billings was used to confirm entrance into the peritoneal cavity.  A pursestring suture was placed around the fascial incision.  The Hasson trocar was inserted into the abdomen  and held in place with the tails of the suture.  The pneumoperitoneum was then established to steady pressure of 15 mmHg.   It was apparent that there were many omental adhesions at the midline, so a port was placed in the LUQ and the LLQ.  The adhesions were noted to be all omental.  These were taken down bluntly, with cold scissors, and with the harmonic scalpel.    One the RLQ was able to be visualized, it was apparent that the inguinal hernia on the right had incarcerated small bowel.  This was gently reduced.  Then there was an additional 5 mm trocar placed in the suprapubic location.  There was pus in the hernia defect that was aspirated.  There was fibrinous exudate on the small bowel in the RLQ and purulent drainage in the pelvis and right abdomen.  There was also peritonitis in this location.  The appendix was located and was dissected away from the surrounding small bowel with gentle blunt dissection.  The appendiceal base was also identified.  The base was skeletonized with the harmonic scalpel. This was then divided with the Endo GIA stapler.     The appendix was removed from the abdomen with an Ecosac through the umbilical port.  There was no evidence of bleeding, leakage, or complication after division of the appendix. The pelvis and lower abdomen were copiously aspirated and irrigated until the irrigant was clear.  Due to the amount of purulent drainage, a 19 Fr Blake drain was placed into the pelvis and brought out through the LLQ trocar site.  This was secured with a 2-0 nylon.    The 5 mm trocars were removed.  The pneumoperitoneum was evacuated from the abdomen.    The trocar site skin wounds  were closed with 4-0 Monocryl and dressed with Dermabond.  Instrument, sponge, and needle counts were correct at the conclusion of the case.   Findings: The appendix was found to be inflamed. There were signs of necrosis.  There was perforation. There was abscess formation.  Estimated Blood  Loss:  less than 50 mL         Drains: 19 Fr blake in pelvis          Specimens: appendix to pathology         Complications:  None; patient tolerated the procedure well.         Disposition: PACU - hemodynamically stable.         Condition: stable

## 2020-08-06 NOTE — Anesthesia Preprocedure Evaluation (Signed)
Anesthesia Evaluation  Patient identified by MRN, date of birth, ID band Patient awake    Reviewed: Allergy & Precautions, NPO status , Patient's Chart, lab work & pertinent test results  History of Anesthesia Complications Negative for: history of anesthetic complications  Airway Mallampati: II  TM Distance: >3 FB Neck ROM: Full    Dental  (+) Dental Advisory Given, Poor Dentition   Pulmonary neg pulmonary ROS,    Pulmonary exam normal        Cardiovascular hypertension, Pt. on medications Normal cardiovascular exam     Neuro/Psych negative neurological ROS  negative psych ROS   GI/Hepatic Neg liver ROS, GERD  ,  Endo/Other  negative endocrine ROS  Renal/GU negative Renal ROS  negative genitourinary   Musculoskeletal negative musculoskeletal ROS (+)   Abdominal   Peds negative pediatric ROS (+)  Hematology negative hematology ROS (+)   Anesthesia Other Findings   Reproductive/Obstetrics negative OB ROS                             Anesthesia Physical Anesthesia Plan  ASA: II and emergent  Anesthesia Plan: General   Post-op Pain Management:    Induction: Intravenous  PONV Risk Score and Plan: 4 or greater and Ondansetron, Dexamethasone and Scopolamine patch - Pre-op  Airway Management Planned: Oral ETT  Additional Equipment:   Intra-op Plan:   Post-operative Plan: Extubation in OR  Informed Consent: I have reviewed the patients History and Physical, chart, labs and discussed the procedure including the risks, benefits and alternatives for the proposed anesthesia with the patient or authorized representative who has indicated his/her understanding and acceptance.     Dental advisory given  Plan Discussed with: CRNA and Anesthesiologist  Anesthesia Plan Comments:         Anesthesia Quick Evaluation

## 2020-08-06 NOTE — Progress Notes (Signed)
Pt out of the floor since 1800

## 2020-08-06 NOTE — Progress Notes (Signed)
Report given to [facility] in [place]

## 2020-08-06 NOTE — ED Notes (Signed)
ED Provider at bedside. 

## 2020-08-06 NOTE — Anesthesia Postprocedure Evaluation (Signed)
Anesthesia Post Note  Patient: Derek Cardenas  Procedure(s) Performed: APPENDECTOMY LAPAROSCOPIC (N/A Abdomen)     Patient location during evaluation: PACU Anesthesia Type: General Level of consciousness: sedated Pain management: pain level controlled Vital Signs Assessment: post-procedure vital signs reviewed and stable Respiratory status: spontaneous breathing and respiratory function stable Cardiovascular status: stable Postop Assessment: no apparent nausea or vomiting Anesthetic complications: no   No complications documented.  Last Vitals:  Vitals:   08/06/20 2250 08/06/20 2309  BP: 107/70 112/70  Pulse: 93 90  Resp: (!) 23 19  Temp:  36.7 C  SpO2: 94% 94%    Last Pain:  Vitals:   08/06/20 2309  TempSrc: Axillary  PainSc:                  [name] DANIEL

## 2020-08-06 NOTE — Plan of Care (Signed)

## 2020-08-06 NOTE — Transfer of Care (Signed)
Immediate Anesthesia Transfer of Care Note  Patient: Derek Cardenas  Procedure(s) Performed: APPENDECTOMY LAPAROSCOPIC (N/A Abdomen)  Patient Location: PACU  Anesthesia Type:General  Level of Consciousness: drowsy and patient cooperative  Airway & Oxygen Therapy: Patient Spontanous Breathing  Post-op Assessment: Report given to RN and Post -op Vital signs reviewed and stable  Post vital signs: Reviewed and stable  Last Vitals:  Vitals Value Taken Time  BP 117/66 08/06/20 2211  Temp    Pulse 102 08/06/20 2212  Resp 29 08/06/20 2212  SpO2 90 % 08/06/20 2212  Vitals shown include unvalidated device data.  Last Pain:  Vitals:   08/06/20 1757  TempSrc: Oral  PainSc:          Complications: No complications documented.

## 2020-08-06 NOTE — Anesthesia Procedure Notes (Signed)
Procedure Name: Intubation Date/Time: 08/06/2020 8:01 PM Performed by: Thelma Comp, CRNA Pre-anesthesia Checklist: Patient identified, Emergency Drugs available, Suction available and Patient being monitored Patient Re-evaluated:Patient Re-evaluated prior to induction Oxygen Delivery Method: Circle System Utilized Preoxygenation: Pre-oxygenation with 100% oxygen Induction Type: IV induction, Rapid sequence and Cricoid Pressure applied Laryngoscope Size: Mac and 4 Grade View: Grade I Tube type: Oral Tube size: 7.5 mm Number of attempts: 1 Airway Equipment and Method: Stylet Placement Confirmation: ETT inserted through vocal cords under direct vision,  positive ETCO2 and breath sounds checked- equal and bilateral Secured at: 23 cm Tube secured with: Tape Dental Injury: Teeth and Oropharynx as per pre-operative assessment

## 2020-08-06 NOTE — Interval H&P Note (Signed)
History and Physical Interval Note:  08/06/2020 7:44 PM  Derek Cardenas  has presented today for surgery, with the diagnosis of Acute Appendicitis.  The various methods of treatment have been discussed with the patient and family. After consideration of risks, benefits and other options for treatment, the patient has consented to  Procedure(s): APPENDECTOMY LAPAROSCOPIC (N/A) as a surgical intervention.  The patient's history has been reviewed, patient examined, no change in status, stable for surgery.  I have reviewed the patient's chart and labs.  Questions were answered to the patient's satisfaction.     Stark Klein

## 2020-08-06 NOTE — ED Notes (Signed)
Attempted to call report.  Secretary informed me that receiving nurse will call me back.

## 2020-08-06 NOTE — H&P (Signed)
[facility] Admission Note  Derek Cardenas [birth year]  [id number].    Requesting MD: Sherwood Gambler Chief Complaint/Reason for Consult: appendicitis  HPI:  Derek Cardenas is a 70yo male PMH HTN and GERD who was transferred from [place] to [facility] for evaluation of appendicitis. Patient states that he developed abdominal pain two days ago. It is mostly midline lower abdomen. Pain was severe yesterday, slightly improved today. Associated symptoms include temp of 100 and one loose stool yesterday. Denies nausea or vomiting. States that he had similar but less severe pain 1 week and 1 month ago; pain at that time was self limiting after several hours.  In the ED patient was found to be afebrile, VSS. WBC 18.2. Covid test is negative. CT abdomen/ pelvis showed acute appendicitis with phlegmonous inflammation surrounding the appendix but no periappendiceal abscess or signs of frank perforation. General surgery asked to see.  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  Respiratory: Negative.   Cardiovascular: Negative.   Gastrointestinal: Positive for abdominal pain and diarrhea. Negative for nausea and vomiting.  Genitourinary: Negative.   Musculoskeletal: Negative.    All systems reviewed and otherwise negative except for as above  History reviewed. No pertinent family history.  Past Medical History:  Diagnosis Date  . Diverticulosis   . GERD (gastroesophageal reflux disease)   . Hypertension     Past Surgical History:  Procedure Laterality Date  . BOWEL RESECTION      Social History:  reports that he has never smoked. His smokeless tobacco use includes chew. He reports previous alcohol use. He reports that he does  not use drugs.  Allergies: No Known Allergies  (Not in a hospital admission)   Prior to Admission medications   Medication Sig Start Date End Date Taking? Authorizing Provider  omeprazole (PRILOSEC) 40 MG capsule Take 40 mg by mouth daily.    [provider]    Blood pressure 118/74, pulse 79, temperature 98 F (36.7 C), temperature source Oral, resp. rate (!) 24, height 5\' 6"  (1.676 m), weight 74.8 kg, SpO2 93 %. Physical Exam: General: pleasant, WD/WN male who is laying in bed in NAD HEENT: head is normocephalic, atraumatic.  Sclera are noninjected.  Pupils equal and round.  Ears and nose without any masses or lesions.  Mouth is pink and moist. Dentition fair Heart: mild tachycardia, regular rhythm.  Normal s1,s2. No obvious murmurs, gallops, or rubs noted.  Palpable pedal pulses bilaterally  Lungs: CTAB, no wheezes, rhonchi, or rales noted.  Respiratory effort nonlabored Abd: well healed midline incision, mild distension but soft, generalized TTP with most significant tenderness in the RLQ and suprapubic region with guarding MS: no BUE/BLE edema, calves soft and nontender Skin: warm and dry with no masses, lesions, or rashes Psych: A&Ox4 with an appropriate affect Neuro: cranial nerves grossly intact, equal strength in BUE/BLE bilaterally, normal speech, thought process intact  Results for orders placed or performed during the hospital encounter of 08/06/20 (from the past 48 hour(s))  Comprehensive metabolic panel     Status: Abnormal   Collection Time: 08/06/20  9:59 AM  Result Value Ref Range   Sodium 137 135 - 145 mmol/L   Potassium 3.9 3.5 - 5.1 mmol/L   Chloride 101 98 - 111 mmol/L  CO2 23 22 - 32 mmol/L   Glucose, Bld 102 (H) 70 - 99 mg/dL    Comment: Glucose reference range applies only to samples taken after fasting for at least 8 hours.   BUN 17 8 - 23 mg/dL   Creatinine, Ser 1.14 0.61 - 1.24 mg/dL   Calcium 9.2 8.9 - 10.3 mg/dL   Total Protein 7.3 6.5 -  8.1 g/dL   Albumin 4.3 3.5 - 5.0 g/dL   AST 24 15 - 41 U/L   ALT 20 0 - 44 U/L   Alkaline Phosphatase 68 38 - 126 U/L   Total Bilirubin 2.1 (H) 0.3 - 1.2 mg/dL   GFR, Estimated >60 >60 mL/min    Comment: (NOTE) Calculated using the CKD-EPI Creatinine Equation (2021)    Anion gap 13 5 - 15    Comment: Performed at [facility]  CBC with Differential     Status: Abnormal   Collection Time: 08/06/20  9:59 AM  Result Value Ref Range   WBC 18.2 (H) 4.0 - 10.5 K/uL   RBC 5.87 (H) 4.22 - 5.81 MIL/uL   Hemoglobin 16.7 13.0 - 17.0 g/dL   HCT 48.5 39.0 - 52.0 %   MCV 82.6 80.0 - 100.0 fL   MCH 28.4 26.0 - 34.0 pg   MCHC 34.4 30.0 - 36.0 g/dL   RDW 13.2 11.5 - 15.5 %   Platelets 224 150 - 400 K/uL   nRBC 0.0 0.0 - 0.2 %   Neutrophils Relative % 82 %   Neutro Abs 14.9 (H) 1.7 - 7.7 K/uL   Lymphocytes Relative 9 %   Lymphs Abs 1.7 0.7 - 4.0 K/uL   Monocytes Relative 7 %   Monocytes Absolute 1.3 (H) 0.1 - 1.0 K/uL   Eosinophils Relative 1 %   Eosinophils Absolute 0.1 0.0 - 0.5 K/uL   Basophils Relative 0 %   Basophils Absolute 0.0 0.0 - 0.1 K/uL   Immature Granulocytes 1 %   Abs Immature Granulocytes 0.11 (H) 0.00 - 0.07 K/uL    Comment: Performed at [facility]  Lipase, blood     Status: None   Collection Time: 08/06/20  9:59 AM  Result Value Ref Range   Lipase 22 11 - 51 U/L    Comment: Performed at [facility]  Urinalysis, Routine w reflex microscopic Urine, Clean Catch     Status: Abnormal   Collection Time: 08/06/20 11:37 AM  Result Value Ref Range   Color, Urine YELLOW YELLOW   APPearance CLEAR CLEAR   Specific Gravity, Urine 1.023 1.005 - 1.030   pH 5.5 5.0 - 8.0   Glucose, UA NEGATIVE NEGATIVE mg/dL   Hgb urine dipstick NEGATIVE NEGATIVE   Bilirubin Urine NEGATIVE NEGATIVE   Ketones, ur NEGATIVE NEGATIVE mg/dL   Protein, ur TRACE (A) NEGATIVE mg/dL   Nitrite NEGATIVE NEGATIVE   Leukocytes,Ua NEGATIVE NEGATIVE     Comment: Performed at [facility]  Resp Panel by RT-PCR (Flu A&B, Covid) Nasopharyngeal Swab     Status: None   Collection Time: 08/06/20 12:55 PM   Specimen: Nasopharyngeal Swab; Nasopharyngeal(NP) swabs in vial transport medium  Result Value Ref Range   SARS Coronavirus 2 by RT PCR NEGATIVE NEGATIVE    Comment: (NOTE) SARS-CoV-2 target nucleic acids are NOT DETECTED.  The SARS-CoV-2 RNA is generally detectable in upper respiratory specimens during the acute phase of infection. The lowest concentration of SARS-CoV-2 viral copies this assay can detect  is 138 copies/mL. A negative result does not preclude SARS-Cov-2 infection and should not be used as the sole basis for treatment or other patient management decisions. A negative result may occur with  improper specimen collection/handling, submission of specimen other than nasopharyngeal swab, presence of viral mutation(s) within the areas targeted by this assay, and inadequate number of viral copies(<138 copies/mL). A negative result must be combined with clinical observations, patient history, and epidemiological information. The expected result is Negative.  Fact Sheet for Patients:  EntrepreneurPulse.com.au  Fact Sheet for Healthcare Providers:  IncredibleEmployment.be  This test is no t yet approved or cleared by the [place] FDA and  has been authorized for detection and/or diagnosis of SARS-CoV-2 by FDA under an Emergency Use Authorization (EUA). This EUA will remain  in effect (meaning this test can be used) for the duration of the COVID-19 declaration under Section 564(b)(1) of the Act, 21 U.S.C.section 360bbb-3(b)(1), unless the authorization is terminated  or revoked sooner.       Influenza A by PCR NEGATIVE NEGATIVE   Influenza B by PCR NEGATIVE NEGATIVE    Comment: (NOTE) The Xpert Xpress SARS-CoV-2/FLU/RSV plus assay is intended as an aid in the diagnosis  of influenza from Nasopharyngeal swab specimens and should not be used as a sole basis for treatment. Nasal washings and aspirates are unacceptable for Xpert Xpress SARS-CoV-2/FLU/RSV testing.  Fact Sheet for Patients: EntrepreneurPulse.com.au  Fact Sheet for Healthcare Providers: IncredibleEmployment.be  This test is not yet approved or cleared by the [place] FDA and has been authorized for detection and/or diagnosis of SARS-CoV-2 by FDA under an Emergency Use Authorization (EUA). This EUA will remain in effect (meaning this test can be used) for the duration of the COVID-19 declaration under Section 564(b)(1) of the Act, 21 U.S.C. section 360bbb-3(b)(1), unless the authorization is terminated or revoked.  Performed at [facility]    CT ABDOMEN PELVIS W CONTRAST  Result Date: 08/06/2020 CLINICAL DATA:  70 year old male with history of right lower quadrant abdominal pain since Monday. Suspected complicated diverticulitis. EXAM: CT ABDOMEN AND PELVIS WITH CONTRAST TECHNIQUE: Multidetector CT imaging of the abdomen and pelvis was performed using the standard protocol following bolus administration of intravenous contrast. CONTRAST:  18mL OMNIPAQUE IOHEXOL 300 MG/ML  SOLN COMPARISON:  CT the abdomen and pelvis 03/11/2013. FINDINGS: Lower chest: Unremarkable. Hepatobiliary: No suspicious cystic or solid hepatic lesions. No intra or extrahepatic biliary ductal dilatation. Gallbladder is normal in appearance. Pancreas: No pancreatic mass. No pancreatic ductal dilatation. No pancreatic or peripancreatic fluid collections or inflammatory changes. Spleen: Unremarkable. Adrenals/Urinary Tract: In the upper pole of the right kidney there is a 4.5 x 4.3 x 4.6 cm heterogeneously enhancing mass which is mildly exophytic but encapsulated within Gerota's fascia and well separated from the right renal vein which is widely patent at this time. In the  medial aspect of the upper pole the left kidney there is an exophytic 4.7 cm low-attenuation nonenhancing lesion, compatible with a simple cyst. No hydroureteronephrosis. Urinary bladder is normal in appearance. Stomach/Bowel: The appearance of the stomach is normal. There is no pathologic dilatation of small bowel or colon. Postoperative changes of partial colectomy are noted in the region of the mid sigmoid colon. A short segment of distal small bowel extends into a right inguinal hernia. Colonic diverticulosis without surrounding inflammatory changes to clearly indicate an acute diverticulitis. Appendix: Location: Inferior to the cecum Diameter: 13 mm Appendicolith: None Mucosal hyper-enhancement: Present Extraluminal gas: None Periappendiceal collection: Inflammatory  changes are noted adjacent to the appendix, without a well-defined periappendiceal fluid collection to suggest abscess. Vascular/Lymphatic: Aortic atherosclerosis, without evidence of aneurysm or dissection in the abdominal or pelvic vasculature. No lymphadenopathy noted in the abdomen or pelvis. Reproductive: Prostate gland and seminal vesicles are unremarkable in appearance. Other: Inflammatory changes are noted adjacent to the appendix in the right lower quadrant of the abdomen. Small right inguinal hernia containing a short segment of distal small bowel, as well as a trace volume of free fluid. No larger volume of ascites. No pneumoperitoneum. Musculoskeletal: There are no aggressive appearing lytic or blastic lesions noted in the visualized portions of the skeleton. IMPRESSION: 1. Findings are compatible with an acute appendicitis. There is phlegmonous inflammation surrounding the appendix, but no periappendiceal abscess or signs of frank perforation noted at this time. 2. Small right inguinal hernia containing a short segment of distal small bowel, without evidence of bowel incarceration or obstruction at this time. 3. Colonic diverticulosis  without evidence of acute diverticulitis at this time. 4. 4.5 x 4.3 x 4.6 cm heterogeneously enhancing mass in the upper pole of the right kidney which is encapsulated within Gerota's fascia and separated from the right renal vein, highly concerning for primary renal cell carcinoma. Nonemergent Urologic consultation is strongly recommended. 5. Aortic atherosclerosis. Critical Value/emergent results were called by telephone at the time of interpretation on 08/06/2020 at 12:34 pm to provider Sherwood Gambler, who verbally acknowledged these results. Electronically Signed   By: Vinnie Langton M.D.   On: 08/06/2020 12:38      Assessment/Plan HTN GERD Diverticulosis Right inguinal hernia - contains small bowel but no evidence of obstruction Right kidney mass - concerning for primary RCC, will need urology evaluation  Acute appendicitis - CT abdomen/ pelvis shows acute appendicitis with phlegmonous inflammation surrounding the appendix but no periappendiceal abscess or signs of frank perforation. Reviewed with MD, plan for laparoscopic appendectomy today. Keep NPO. Continue IV zosyn.   ID - zosyn 4/20>> VTE - SCDs, lovenox FEN - IVF, NPO Foley - none Follow up - TBD  Margie Billet, [facility] 08/06/2020, 2:09 PM Please see Amion for pager number during day hours 7:00am-4:30pm

## 2020-08-06 NOTE — ED Provider Notes (Addendum)
[facility] EMERGENCY DEPT Provider Note   CSN: [id number] Arrival date & time: 08/06/20  8563     History Chief Complaint  Patient presents with  . Abdominal Pain    Derek Cardenas is a 70 y.o. male.  HPI 70 year old male presents with abdominal pain. He developed this pain 2 nights ago. The pain was at its worst yesterday. Today it's rated as a 9/10. Primarily midline lower abdomen. He has had similar pain that lasted less than 1 day once last week and once earlier in the month. Had a temp of 100.1 this morning, took tylenol at 4 am. No vomiting, diarrhea, blood in the stool. No back pain. Has a history of diverticulitis, though this pain did not start in the LLQ like it often does. No testicle pain  He is also noted to have a rash. He has not had any new meds/foods that he's not had before. This rash was noticed when he was being changed into his gown, and it is itchy. No facial swelling or trouble breathing.   Past Medical History:  Diagnosis Date  . Diverticulosis   . GERD (gastroesophageal reflux disease)   . Hypertension     Patient Active Problem List   Diagnosis Date Noted  . Acute appendicitis 08/06/2020  . Acute diverticulitis 03/11/2013  . GERD (gastroesophageal reflux disease) 03/11/2013    Past Surgical History:  Procedure Laterality Date  . BOWEL RESECTION         History reviewed. No pertinent family history.  Social History   Tobacco Use  . Smoking status: Never Smoker  . Smokeless tobacco: Current User    Types: Chew  Substance Use Topics  . Alcohol use: Not Currently  . Drug use: Never    Home Medications Prior to Admission medications   Medication Sig Start Date End Date Taking? Authorizing Provider  omeprazole (PRILOSEC) 40 MG capsule Take 40 mg by mouth daily.    [provider]    Allergies    Patient has no known allergies.  Review of Systems   Review of Systems  Constitutional: Positive for fever.  HENT:  Negative for facial swelling.   Respiratory: Negative for shortness of breath.   Gastrointestinal: Positive for abdominal pain. Negative for diarrhea and vomiting.  Genitourinary: Negative for dysuria.  Musculoskeletal: Negative for back pain.  Skin: Positive for rash.  All other systems reviewed and are negative.   Physical Exam Updated Vital Signs BP 118/74 (BP Location: Right Arm)   Pulse 79   Temp 98 F (36.7 C) (Oral)   Resp (!) 24   Ht 5\' 6"  (1.676 m)   Wt 74.8 kg   SpO2 93%   BMI 26.63 kg/m   Physical Exam Vitals and nursing note reviewed.  Constitutional:      General: He is not in acute distress.    Appearance: He is well-developed. He is not ill-appearing or diaphoretic.  HENT:     Head: Normocephalic and atraumatic.     Right Ear: External ear normal.     Left Ear: External ear normal.     Nose: Nose normal.     Mouth/Throat:     Comments: No oropharyngeal swelling Eyes:     General:        Right eye: No discharge.        Left eye: No discharge.  Cardiovascular:     Rate and Rhythm: Normal rate and regular rhythm.     Heart sounds: Normal  heart sounds.  Pulmonary:     Effort: Pulmonary effort is normal.     Breath sounds: Normal breath sounds. No stridor. No wheezing or rales.  Abdominal:     Palpations: Abdomen is soft.     Tenderness: There is abdominal tenderness in the right lower quadrant, suprapubic area and left lower quadrant. There is guarding.  Musculoskeletal:     Cervical back: Neck supple.  Skin:    General: Skin is warm and dry.     Findings: Rash present.     Comments: Patient has irregular erythematous rash that is diffuse, mostly in upper back and a little bit on his face. Small lesions on his proximal thighs.   Neurological:     Mental Status: He is alert.  Psychiatric:        Mood and Affect: Mood is not anxious.     ED Results / Procedures / Treatments   Labs (all labs ordered are listed, but only abnormal results are  displayed) Labs Reviewed  COMPREHENSIVE METABOLIC PANEL - Abnormal; Notable for the following components:      Result Value   Glucose, Bld 102 (*)    Total Bilirubin 2.1 (*)    All other components within normal limits  URINALYSIS, ROUTINE W REFLEX MICROSCOPIC - Abnormal; Notable for the following components:   Protein, ur TRACE (*)    All other components within normal limits  CBC WITH DIFFERENTIAL/PLATELET - Abnormal; Notable for the following components:   WBC 18.2 (*)    RBC 5.87 (*)    Neutro Abs 14.9 (*)    Monocytes Absolute 1.3 (*)    Abs Immature Granulocytes 0.11 (*)    All other components within normal limits  RESP PANEL BY RT-PCR (FLU A&B, COVID) [id number]  LIPASE, BLOOD    EKG None  Radiology CT ABDOMEN PELVIS W CONTRAST  Result Date: 08/06/2020 CLINICAL DATA:  70 year old male with history of right lower quadrant abdominal pain since Monday. Suspected complicated diverticulitis. EXAM: CT ABDOMEN AND PELVIS WITH CONTRAST TECHNIQUE: Multidetector CT imaging of the abdomen and pelvis was performed using the standard protocol following bolus administration of intravenous contrast. CONTRAST:  40mL OMNIPAQUE IOHEXOL 300 MG/ML  SOLN COMPARISON:  CT the abdomen and pelvis 03/11/2013. FINDINGS: Lower chest: Unremarkable. Hepatobiliary: No suspicious cystic or solid hepatic lesions. No intra or extrahepatic biliary ductal dilatation. Gallbladder is normal in appearance. Pancreas: No pancreatic mass. No pancreatic ductal dilatation. No pancreatic or peripancreatic fluid collections or inflammatory changes. Spleen: Unremarkable. Adrenals/Urinary Tract: In the upper pole of the right kidney there is a 4.5 x 4.3 x 4.6 cm heterogeneously enhancing mass which is mildly exophytic but encapsulated within Gerota's fascia and well separated from the right renal vein which is widely patent at this time. In the medial aspect of the upper pole the left kidney there is an exophytic 4.7 cm  low-attenuation nonenhancing lesion, compatible with a simple cyst. No hydroureteronephrosis. Urinary bladder is normal in appearance. Stomach/Bowel: The appearance of the stomach is normal. There is no pathologic dilatation of small bowel or colon. Postoperative changes of partial colectomy are noted in the region of the mid sigmoid colon. A short segment of distal small bowel extends into a right inguinal hernia. Colonic diverticulosis without surrounding inflammatory changes to clearly indicate an acute diverticulitis. Appendix: Location: Inferior to the cecum Diameter: 13 mm Appendicolith: None Mucosal hyper-enhancement: Present Extraluminal gas: None Periappendiceal collection: Inflammatory changes are noted adjacent to the appendix, without a well-defined periappendiceal fluid collection  to suggest abscess. Vascular/Lymphatic: Aortic atherosclerosis, without evidence of aneurysm or dissection in the abdominal or pelvic vasculature. No lymphadenopathy noted in the abdomen or pelvis. Reproductive: Prostate gland and seminal vesicles are unremarkable in appearance. Other: Inflammatory changes are noted adjacent to the appendix in the right lower quadrant of the abdomen. Small right inguinal hernia containing a short segment of distal small bowel, as well as a trace volume of free fluid. No larger volume of ascites. No pneumoperitoneum. Musculoskeletal: There are no aggressive appearing lytic or blastic lesions noted in the visualized portions of the skeleton. IMPRESSION: 1. Findings are compatible with an acute appendicitis. There is phlegmonous inflammation surrounding the appendix, but no periappendiceal abscess or signs of frank perforation noted at this time. 2. Small right inguinal hernia containing a short segment of distal small bowel, without evidence of bowel incarceration or obstruction at this time. 3. Colonic diverticulosis without evidence of acute diverticulitis at this time. 4. 4.5 x 4.3 x 4.6 cm  heterogeneously enhancing mass in the upper pole of the right kidney which is encapsulated within Gerota's fascia and separated from the right renal vein, highly concerning for primary renal cell carcinoma. Nonemergent Urologic consultation is strongly recommended. 5. Aortic atherosclerosis. Critical Value/emergent results were called by telephone at the time of interpretation on 08/06/2020 at 12:34 pm to provider Sherwood Gambler, who verbally acknowledged these results. Electronically Signed   By: Vinnie Langton M.D.   On: 08/06/2020 12:38    Procedures Procedures   Medications Ordered in ED Medications  piperacillin-tazobactam (ZOSYN) IVPB 3.375 g (3.375 g Intravenous New Bag/Given 08/06/20 1251)  0.9 %  sodium chloride infusion (25 mLs Intravenous New Bag/Given 08/06/20 1250)  sodium chloride 0.9 % bolus 1,000 mL (0 mLs Intravenous Stopped 08/06/20 1251)  acetaminophen (TYLENOL) tablet 1,000 mg (1,000 mg Oral Given 08/06/20 1003)  diphenhydrAMINE (BENADRYL) capsule 25 mg (25 mg Oral Given 08/06/20 1003)  iohexol (OMNIPAQUE) 300 MG/ML solution 80 mL (80 mLs Intravenous Contrast Given 08/06/20 1122)    ED Course  I have reviewed the triage vital signs and the nursing notes.  Pertinent labs & imaging results that were available during my care of the patient were reviewed by me and considered in my medical decision making (see chart for details).    MDM Rules/Calculators/A&P                          Patient CT scan has been reviewed and shows acute appendicitis with phlegmon but no abscess or perforation.  There is also concomitant right inguinal hernia which at this point is not able to be significantly felt on exam.  There is no incarceration.  He was also made aware of the right renal mass that is concerning for renal cell carcinoma.  I discussed all of these results with Dr. Kieth Brightly, who accepts in admission to [facility].  We will keep NPO.  He was given IV Zosyn and fluids.  He has declined  pain medicine.  It is unclear what was causing his transient rash/hives this morning.  Resolved with Benadryl and he has no further itching. Final Clinical Impression(s) / ED Diagnoses Final diagnoses:  Acute appendicitis with localized peritonitis, without perforation, abscess, or gangrene  Right renal mass  Right inguinal hernia  Hives    Rx / DC Orders ED Discharge Orders    None       Sherwood Gambler, MD 08/06/20 1302    Sherwood Gambler, MD 08/06/20  1303  

## 2020-08-06 NOTE — ED Triage Notes (Signed)
Severe R/L lower quadrant pain since Monday.  Noted that patient has some red, elevated rashes on his back and he stated that it itches.

## 2020-08-07 ENCOUNTER — Encounter (HOSPITAL_COMMUNITY): Payer: Self-pay | Admitting: General Surgery

## 2020-08-07 LAB — CBC
HCT: 41.8 % (ref 39.0–52.0)
Hemoglobin: 14.3 g/dL (ref 13.0–17.0)
MCH: 28.5 pg (ref 26.0–34.0)
MCHC: 34.2 g/dL (ref 30.0–36.0)
MCV: 83.3 fL (ref 80.0–100.0)
Platelets: 190 10*3/uL (ref 150–400)
RBC: 5.02 MIL/uL (ref 4.22–5.81)
RDW: 13.2 % (ref 11.5–15.5)
WBC: 16.6 10*3/uL — ABNORMAL HIGH (ref 4.0–10.5)
nRBC: 0 % (ref 0.0–0.2)

## 2020-08-07 LAB — BASIC METABOLIC PANEL
Anion gap: 7 (ref 5–15)
BUN: 12 mg/dL (ref 8–23)
CO2: 24 mmol/L (ref 22–32)
Calcium: 8.3 mg/dL — ABNORMAL LOW (ref 8.9–10.3)
Chloride: 105 mmol/L (ref 98–111)
Creatinine, Ser: 1.08 mg/dL (ref 0.61–1.24)
GFR, Estimated: >60 mL/min (ref >60)
Glucose, Bld: 139 mg/dL — ABNORMAL HIGH (ref 70–99)
Potassium: 4.1 mmol/L (ref 3.5–5.1)
Sodium: 136 mmol/L (ref 135–145)

## 2020-08-07 LAB — HIV ANTIBODY (ROUTINE TESTING W REFLEX): HIV Screen 4th Generation wRfx: NONREACTIVE

## 2020-08-07 MED ORDER — ACETAMINOPHEN 650 MG RE SUPP: 650.0000 mg | Freq: Three times a day (TID) | RECTAL | Status: DC

## 2020-08-07 MED ORDER — ACETAMINOPHEN 500 MG PO TABS
1000.0000 mg | ORAL_TABLET | Freq: Three times a day (TID) | ORAL | Status: DC
  Administered 2020-08-07 – 2020-08-10 (×10): 1000 mg via ORAL
  Filled 2020-08-07 (×9): qty 2

## 2020-08-07 MED ORDER — OXYCODONE HCL 5 MG PO TABS
5.0000 mg | ORAL_TABLET | ORAL | Status: DC | PRN
Start: 2020-08-07 — End: 2020-08-10

## 2020-08-07 MED ORDER — KETOROLAC TROMETHAMINE 15 MG/ML IJ SOLN
15.0000 mg | Freq: Four times a day (QID) | INTRAMUSCULAR | Status: DC | PRN
  Administered 2020-08-07 – 2020-08-08 (×2): 15 mg via INTRAVENOUS
  Filled 2020-08-07 (×3): qty 1

## 2020-08-07 MED ORDER — CHLORHEXIDINE GLUCONATE CLOTH 2 % EX PADS
6.0000 | MEDICATED_PAD | Freq: Every day | CUTANEOUS | Status: DC
  Administered 2020-08-07: 6 via TOPICAL

## 2020-08-07 MED ORDER — MORPHINE SULFATE (PF) 2 MG/ML IV SOLN: 2.0000 mg | INTRAVENOUS | Status: DC | PRN

## 2020-08-07 MED ORDER — DOCUSATE SODIUM 100 MG PO CAPS
100.0000 mg | ORAL_CAPSULE | Freq: Two times a day (BID) | ORAL | Status: DC
  Administered 2020-08-07 – 2020-08-10 (×5): 100 mg via ORAL
  Filled 2020-08-07 (×6): qty 1

## 2020-08-07 NOTE — Progress Notes (Signed)
[facility] Progress Note  1 Day Post-Op  Subjective: CC-  Abdomen very sore but pain improved/different from prior to surgery. Denies n/v. Passing a little flatus, no BM. Tolerating clear liquids.  Objective: Vital signs in last 24 hours: Temp:  [98 F (36.7 C)-100.3 F (37.9 C)] 99.2 F (37.3 C) (04/21 0702) Pulse Rate:  [74-98] 81 (04/21 0702) Resp:  [17-30] 17 (04/21 0702) BP: (104-162)/(59-101) 104/59 (04/21 0702) SpO2:  [91 %-98 %] 93 % (04/21 0702) Weight:  [74.8 kg] 74.8 kg (04/20 0944) Last BM Date: 08/05/20  Intake/Output from previous day: 04/20 0701 - 04/21 0700 In: 2271.5 [P.O.:200; I.V.:1021.2; IV Piggyback:1050.2] Out: 9678 [Urine:850; Drains:200; Blood:20] Intake/Output this shift: No intake/output data recorded.  PE: Gen:  Alert, NAD, pleasant Pulm: rate and effort normal Abd: Soft, minimal distension, appropriately tender, few BS heard, lap incisions cdi, JP drain with serosanguinous fluid in bulb  Lab Results:  Recent Labs    08/06/20 0959 08/07/20 0154  WBC 18.2* 16.6*  HGB 16.7 14.3  HCT 48.5 41.8  PLT 224 190   BMET Recent Labs    08/06/20 0959 08/07/20 0154  NA 137 136  K 3.9 4.1  CL 101 105  CO2 23 24  GLUCOSE 102* 139*  BUN 17 12  CREATININE 1.14 1.08  CALCIUM 9.2 8.3*   PT/INR No results for input(s): LABPROT, INR in the last 72 hours. CMP     Component Value Date/Time   NA 136 08/07/2020 0154   K 4.1 08/07/2020 0154   CL 105 08/07/2020 0154   CO2 24 08/07/2020 0154   GLUCOSE 139 (H) 08/07/2020 0154   BUN 12 08/07/2020 0154   CREATININE 1.08 08/07/2020 0154   CALCIUM 8.3 (L) 08/07/2020 0154   PROT 7.3 08/06/2020 0959   ALBUMIN 4.3 08/06/2020 0959   AST 24 08/06/2020 0959   ALT 20 08/06/2020 0959   ALKPHOS 68 08/06/2020 0959   BILITOT 2.1 (H) 08/06/2020 0959   GFRNONAA >60 08/07/2020 0154   GFRAA >90 03/13/2013 0700   Lipase     Component Value Date/Time   LIPASE 22 08/06/2020 0959        Studies/Results: CT ABDOMEN PELVIS W CONTRAST  Result Date: 08/06/2020 CLINICAL DATA:  70 year old male with history of right lower quadrant abdominal pain since Monday. Suspected complicated diverticulitis. EXAM: CT ABDOMEN AND PELVIS WITH CONTRAST TECHNIQUE: Multidetector CT imaging of the abdomen and pelvis was performed using the standard protocol following bolus administration of intravenous contrast. CONTRAST:  42mL OMNIPAQUE IOHEXOL 300 MG/ML  SOLN COMPARISON:  CT the abdomen and pelvis 03/11/2013. FINDINGS: Lower chest: Unremarkable. Hepatobiliary: No suspicious cystic or solid hepatic lesions. No intra or extrahepatic biliary ductal dilatation. Gallbladder is normal in appearance. Pancreas: No pancreatic mass. No pancreatic ductal dilatation. No pancreatic or peripancreatic fluid collections or inflammatory changes. Spleen: Unremarkable. Adrenals/Urinary Tract: In the upper pole of the right kidney there is a 4.5 x 4.3 x 4.6 cm heterogeneously enhancing mass which is mildly exophytic but encapsulated within Gerota's fascia and well separated from the right renal vein which is widely patent at this time. In the medial aspect of the upper pole the left kidney there is an exophytic 4.7 cm low-attenuation nonenhancing lesion, compatible with a simple cyst. No hydroureteronephrosis. Urinary bladder is normal in appearance. Stomach/Bowel: The appearance of the stomach is normal. There is no pathologic dilatation of small bowel or colon. Postoperative changes of partial colectomy are noted in the region of the mid sigmoid colon.  A short segment of distal small bowel extends into a right inguinal hernia. Colonic diverticulosis without surrounding inflammatory changes to clearly indicate an acute diverticulitis. Appendix: Location: Inferior to the cecum Diameter: 13 mm Appendicolith: None Mucosal hyper-enhancement: Present Extraluminal gas: None Periappendiceal collection: Inflammatory changes are  noted adjacent to the appendix, without a well-defined periappendiceal fluid collection to suggest abscess. Vascular/Lymphatic: Aortic atherosclerosis, without evidence of aneurysm or dissection in the abdominal or pelvic vasculature. No lymphadenopathy noted in the abdomen or pelvis. Reproductive: Prostate gland and seminal vesicles are unremarkable in appearance. Other: Inflammatory changes are noted adjacent to the appendix in the right lower quadrant of the abdomen. Small right inguinal hernia containing a short segment of distal small bowel, as well as a trace volume of free fluid. No larger volume of ascites. No pneumoperitoneum. Musculoskeletal: There are no aggressive appearing lytic or blastic lesions noted in the visualized portions of the skeleton. IMPRESSION: 1. Findings are compatible with an acute appendicitis. There is phlegmonous inflammation surrounding the appendix, but no periappendiceal abscess or signs of frank perforation noted at this time. 2. Small right inguinal hernia containing a short segment of distal small bowel, without evidence of bowel incarceration or obstruction at this time. 3. Colonic diverticulosis without evidence of acute diverticulitis at this time. 4. 4.5 x 4.3 x 4.6 cm heterogeneously enhancing mass in the upper pole of the right kidney which is encapsulated within Gerota's fascia and separated from the right renal vein, highly concerning for primary renal cell carcinoma. Nonemergent Urologic consultation is strongly recommended. 5. Aortic atherosclerosis. Critical Value/emergent results were called by telephone at the time of interpretation on 08/06/2020 at 12:34 pm to provider Sherwood Gambler, who verbally acknowledged these results. Electronically Signed   By: Vinnie Langton M.D.   On: 08/06/2020 12:38    Anti-infectives: Anti-infectives (From admission, onward)   Start     Dose/Rate Route Frequency Ordered Stop   08/06/20 1700  piperacillin-tazobactam (ZOSYN) IVPB  3.375 g        3.375 g 12.5 mL/hr over 240 Minutes Intravenous Every 8 hours 08/06/20 1602     08/06/20 1245  piperacillin-tazobactam (ZOSYN) IVPB 3.375 g        3.375 g 100 mL/hr over 30 Minutes Intravenous  Once 08/06/20 1237 08/06/20 1321       Assessment/Plan HTN GERD Diverticulosis Right inguinal hernia - contains small bowel but no evidence of obstruction Right kidney mass - concerning for primary RCC, will need urology evaluation. I will call to arrange outpatient follow up  Acute appendicitis with generalized peritonitis and abscess S/p laparoscopic appendectomy 4/20 Dr. Barry Dienes - POD#1 - needs at least 10 days of antibiotics - continue JP drain for now, may be able to d/c prior to discharge if output serous - Schedule tylenol for better pain control. Mobilize. [place] for full liquids.   ID - zosyn 4/20>> VTE - SCDs, lovenox FEN - decrease IVF, FLD Foley - continue today and plan to d/c POD#2   LOS: 1 day    Wellington Hampshire, [facility] 08/07/2020, 8:58 AM Please see Amion for pager number during day hours 7:00am-4:30pm

## 2020-08-07 NOTE — Progress Notes (Signed)
Foley flushed with 30cc sterile water following complaints of sensation to void from pt. Pt stated that the last time he felt that way and tried to pee ,he noticed some leaking from the foley.

## 2020-08-08 LAB — BASIC METABOLIC PANEL
Anion gap: 8 (ref 5–15)
BUN: 16 mg/dL (ref 8–23)
CO2: 20 mmol/L — ABNORMAL LOW (ref 22–32)
Calcium: 8 mg/dL — ABNORMAL LOW (ref 8.9–10.3)
Chloride: 108 mmol/L (ref 98–111)
Creatinine, Ser: 0.98 mg/dL (ref 0.61–1.24)
GFR, Estimated: >60 mL/min (ref >60)
Glucose, Bld: 126 mg/dL — ABNORMAL HIGH (ref 70–99)
Potassium: 3.9 mmol/L (ref 3.5–5.1)
Sodium: 136 mmol/L (ref 135–145)

## 2020-08-08 LAB — CBC
HCT: 34.9 % — ABNORMAL LOW (ref 39.0–52.0)
Hemoglobin: 11.8 g/dL — ABNORMAL LOW (ref 13.0–17.0)
MCH: 28.4 pg (ref 26.0–34.0)
MCHC: 33.8 g/dL (ref 30.0–36.0)
MCV: 83.9 fL (ref 80.0–100.0)
Platelets: 140 10*3/uL — ABNORMAL LOW (ref 150–400)
RBC: 4.16 MIL/uL — ABNORMAL LOW (ref 4.22–5.81)
RDW: 12.9 % (ref 11.5–15.5)
WBC: 12.2 10*3/uL — ABNORMAL HIGH (ref 4.0–10.5)
nRBC: 0 % (ref 0.0–0.2)

## 2020-08-08 LAB — SURGICAL PATHOLOGY

## 2020-08-08 LAB — MAGNESIUM: Magnesium: 2.2 mg/dL (ref 1.7–2.4)

## 2020-08-08 MED ORDER — PANTOPRAZOLE SODIUM 40 MG PO TBEC
40.0000 mg | DELAYED_RELEASE_TABLET | Freq: Every day | ORAL | Status: DC
  Administered 2020-08-08 – 2020-08-09 (×2): 40 mg via ORAL
  Filled 2020-08-08 (×2): qty 1

## 2020-08-08 NOTE — Care Management Important Message (Signed)
Important Message  Patient Details  Name: Derek Cardenas MRN: [id number] Date of Birth: October 10, 1950   Medicare Important Message Given:  Yes     Orbie Pyo 08/08/2020, 2:18 PM

## 2020-08-08 NOTE — Discharge Instructions (Addendum)
Your CT scan showed a 4.5 x 4.3 x 4.6 cm heterogeneously enhancing mass in the upper pole of the right kidney which is encapsulated within Gerota's fascia and separated from the right renal vein, highly concerning for primary renal cell carcinoma. We have reached out to [facility] to schedule you an appointment for further workup.    [facility]  Please arrive at least 30 min before your appointment to complete your check in paperwork.  If you are unable to arrive 30 min prior to your appointment time we may have to cancel or reschedule you. LAPAROSCOPIC SURGERY: POST OP INSTRUCTIONS Always review your discharge instruction sheet given to you by the facility where your surgery was performed. IF YOU HAVE DISABILITY OR FAMILY LEAVE FORMS, YOU MUST BRING THEM TO THE OFFICE FOR PROCESSING.   DO NOT GIVE THEM TO YOUR DOCTOR.  PAIN CONTROL  1. First take acetaminophen (Tylenol) AND/or ibuprofen (Advil) to control your pain after surgery.  Follow directions on package.  Taking acetaminophen (Tylenol) and/or ibuprofen (Advil) regularly after surgery will help to control your pain and lower the amount of prescription pain medication you may need.  You should not take more than 4,000 mg (4 grams) of acetaminophen (Tylenol) in 24 hours.  You should not take ibuprofen (Advil), aleve, motrin, naprosyn or other NSAIDS if you have a history of stomach ulcers or chronic kidney disease.  2. A prescription for pain medication may be given to you upon discharge.  Take your pain medication as prescribed, if you still have uncontrolled pain after taking acetaminophen (Tylenol) or ibuprofen (Advil). 3. Use ice packs to help control pain. 4. If you need a refill on your pain medication, please contact your pharmacy.  They will contact our office to request authorization. Prescriptions will not be filled after 5pm or on week-ends.  HOME MEDICATIONS 5. Take your usually prescribed  medications unless otherwise directed.  DIET 6. You should follow a light diet the first few days after arrival home.  Be sure to include lots of fluids daily. Avoid fatty, fried foods.   CONSTIPATION 7. It is common to experience some constipation after surgery and if you are taking pain medication.  Increasing fluid intake and taking a stool softener (such as Colace) will usually help or prevent this problem from occurring.  A mild laxative (Milk of Magnesia or Miralax) should be taken according to package instructions if there are no bowel movements after 48 hours.  WOUND/INCISION CARE 8. Most patients will experience some swelling and bruising in the area of the incisions.  Ice packs will help.  Swelling and bruising can take several days to resolve.  9. Unless discharge instructions indicate otherwise, follow guidelines below  a. STERI-STRIPS - you may remove your outer bandages 48 hours after surgery, and you may shower at that time.  You have steri-strips (small skin tapes) in place directly over the incision.  These strips should be left on the skin for 7-10 days.   b. DERMABOND/SKIN GLUE - you may shower in 24 hours.  The glue will flake off over the next 2-3 weeks. 10. Any sutures or staples will be removed at the office during your follow-up visit.  ACTIVITIES 11. You may resume regular (light) daily activities beginning the next day--such as daily self-care, walking, climbing stairs--gradually increasing activities as tolerated.  You may have sexual intercourse when it is comfortable.  Refrain from any heavy lifting or straining until approved by  your doctor. a. You may drive when you are no longer taking prescription pain medication, you can comfortably wear a seatbelt, and you can safely maneuver your car and apply brakes.  FOLLOW-UP 12. You should see your doctor in the office for a follow-up appointment approximately 2-3 weeks after your surgery.  You should have been given your  post-op/follow-up appointment when your surgery was scheduled.  If you did not receive a post-op/follow-up appointment, make sure that you call for this appointment within a day or two after you arrive home to insure a convenient appointment time.  OTHER INSTRUCTIONS  WHEN TO CALL YOUR DOCTOR: 1. Fever over 101.0 2. Inability to urinate 3. Continued bleeding from incision. 4. Increased pain, redness, or drainage from the incision. 5. Increasing abdominal pain  The clinic staff is available to answer your questions during regular business hours.  Please don't hesitate to call and ask to speak to one of the nurses for clinical concerns.  If you have a medical emergency, go to the nearest emergency room or call 911.  A surgeon from [facility] is always on call at the hospital. [address], [place], [place], [place]  [address] ? [address], [place],    [phone] ? [phone] ? FAX [phone]

## 2020-08-08 NOTE — Progress Notes (Signed)
[facility] Progress Note  2 Days Post-Op  Subjective: CC-  Feeling well this morning. Abdominal pain improving. Tolerating full liquids. Denies bloating, n/v. Passing flatus, no BM. Feels hungry. Ambulated in the halls yesterday and sat in the chair for a long time.  Objective: Vital signs in last 24 hours: Temp:  [97.7 F (36.5 C)-98.2 F (36.8 C)] 98.2 F (36.8 C) (04/22 0430) Pulse Rate:  [59-78] 59 (04/22 0430) Resp:  [16-17] 16 (04/22 0430) BP: (95-100)/(60-65) 100/65 (04/22 0430) SpO2:  [93 %-96 %] 93 % (04/22 0430) Last BM Date: 08/05/20  Intake/Output from previous day: 04/21 0701 - 04/22 0700 In: 740 [P.O.:740] Out: 2135 [Urine:2050; Drains:85] Intake/Output this shift: No intake/output data recorded.  PE: Gen:  Alert, NAD, pleasant Pulm: rate and effort normal Abd: Soft, minimal distension, nontender, + BS heard, lap incisions cdi, JP drain with serosanguinous fluid in bulb  Lab Results:  Recent Labs    08/07/20 0154 08/08/20 0202  WBC 16.6* 12.2*  HGB 14.3 11.8*  HCT 41.8 34.9*  PLT 190 140*   BMET Recent Labs    08/07/20 0154 08/08/20 0202  NA 136 136  K 4.1 3.9  CL 105 108  CO2 24 20*  GLUCOSE 139* 126*  BUN 12 16  CREATININE 1.08 0.98  CALCIUM 8.3* 8.0*   PT/INR No results for input(s): LABPROT, INR in the last 72 hours. CMP     Component Value Date/Time   NA 136 08/08/2020 0202   K 3.9 08/08/2020 0202   CL 108 08/08/2020 0202   CO2 20 (L) 08/08/2020 0202   GLUCOSE 126 (H) 08/08/2020 0202   BUN 16 08/08/2020 0202   CREATININE 0.98 08/08/2020 0202   CALCIUM 8.0 (L) 08/08/2020 0202   PROT 7.3 08/06/2020 0959   ALBUMIN 4.3 08/06/2020 0959   AST 24 08/06/2020 0959   ALT 20 08/06/2020 0959   ALKPHOS 68 08/06/2020 0959   BILITOT 2.1 (H) 08/06/2020 0959   GFRNONAA >60 08/08/2020 0202   GFRAA >90 03/13/2013 0700   Lipase     Component Value Date/Time   LIPASE 22 08/06/2020 0959       Studies/Results: CT  ABDOMEN PELVIS W CONTRAST  Result Date: 08/06/2020 CLINICAL DATA:  70 year old male with history of right lower quadrant abdominal pain since Monday. Suspected complicated diverticulitis. EXAM: CT ABDOMEN AND PELVIS WITH CONTRAST TECHNIQUE: Multidetector CT imaging of the abdomen and pelvis was performed using the standard protocol following bolus administration of intravenous contrast. CONTRAST:  50mL OMNIPAQUE IOHEXOL 300 MG/ML  SOLN COMPARISON:  CT the abdomen and pelvis 03/11/2013. FINDINGS: Lower chest: Unremarkable. Hepatobiliary: No suspicious cystic or solid hepatic lesions. No intra or extrahepatic biliary ductal dilatation. Gallbladder is normal in appearance. Pancreas: No pancreatic mass. No pancreatic ductal dilatation. No pancreatic or peripancreatic fluid collections or inflammatory changes. Spleen: Unremarkable. Adrenals/Urinary Tract: In the upper pole of the right kidney there is a 4.5 x 4.3 x 4.6 cm heterogeneously enhancing mass which is mildly exophytic but encapsulated within Gerota's fascia and well separated from the right renal vein which is widely patent at this time. In the medial aspect of the upper pole the left kidney there is an exophytic 4.7 cm low-attenuation nonenhancing lesion, compatible with a simple cyst. No hydroureteronephrosis. Urinary bladder is normal in appearance. Stomach/Bowel: The appearance of the stomach is normal. There is no pathologic dilatation of small bowel or colon. Postoperative changes of partial colectomy are noted in the region of the mid sigmoid colon.  A short segment of distal small bowel extends into a right inguinal hernia. Colonic diverticulosis without surrounding inflammatory changes to clearly indicate an acute diverticulitis. Appendix: Location: Inferior to the cecum Diameter: 13 mm Appendicolith: None Mucosal hyper-enhancement: Present Extraluminal gas: None Periappendiceal collection: Inflammatory changes are noted adjacent to the appendix,  without a well-defined periappendiceal fluid collection to suggest abscess. Vascular/Lymphatic: Aortic atherosclerosis, without evidence of aneurysm or dissection in the abdominal or pelvic vasculature. No lymphadenopathy noted in the abdomen or pelvis. Reproductive: Prostate gland and seminal vesicles are unremarkable in appearance. Other: Inflammatory changes are noted adjacent to the appendix in the right lower quadrant of the abdomen. Small right inguinal hernia containing a short segment of distal small bowel, as well as a trace volume of free fluid. No larger volume of ascites. No pneumoperitoneum. Musculoskeletal: There are no aggressive appearing lytic or blastic lesions noted in the visualized portions of the skeleton. IMPRESSION: 1. Findings are compatible with an acute appendicitis. There is phlegmonous inflammation surrounding the appendix, but no periappendiceal abscess or signs of frank perforation noted at this time. 2. Small right inguinal hernia containing a short segment of distal small bowel, without evidence of bowel incarceration or obstruction at this time. 3. Colonic diverticulosis without evidence of acute diverticulitis at this time. 4. 4.5 x 4.3 x 4.6 cm heterogeneously enhancing mass in the upper pole of the right kidney which is encapsulated within Gerota's fascia and separated from the right renal vein, highly concerning for primary renal cell carcinoma. Nonemergent Urologic consultation is strongly recommended. 5. Aortic atherosclerosis. Critical Value/emergent results were called by telephone at the time of interpretation on 08/06/2020 at 12:34 pm to provider Sherwood Gambler, who verbally acknowledged these results. Electronically Signed   By: Vinnie Langton M.D.   On: 08/06/2020 12:38    Anti-infectives: Anti-infectives (From admission, onward)   Start     Dose/Rate Route Frequency Ordered Stop   08/06/20 1700  piperacillin-tazobactam (ZOSYN) IVPB 3.375 g        3.375 g 12.5  mL/hr over 240 Minutes Intravenous Every 8 hours 08/06/20 1602     08/06/20 1245  piperacillin-tazobactam (ZOSYN) IVPB 3.375 g        3.375 g 100 mL/hr over 30 Minutes Intravenous  Once 08/06/20 1237 08/06/20 1321       Assessment/Plan HTN GERD Diverticulosis Right inguinal hernia - contains small bowel but no evidence of obstruction Right kidney mass - concerning for primary RCC, will need urology evaluation. Info faxed to their office and they should contact pt with appointment ABL anemia - Hgb 11.8 from 14.3, recheck CBC in AM  Acute appendicitis with generalized peritonitis and abscess S/p laparoscopic appendectomy 4/20 Dr. Barry Dienes - POD#2 - needs at least 10 days of antibiotics, continue IV zosyn during admission - continue JP drain for now, may be able to d/c prior to discharge if output serous - Advance to regular diet. Continue mobilizing. May be ready for discharge over the weekend.  ID -[id number]>> VTE -SCDs, lovenox FEN - d/c IVF, reg diet Foley -d/c today 4/22   LOS: 2 days    Wellington Hampshire, [facility] 08/08/2020, 8:09 AM Please see Amion for pager number during day hours 7:00am-4:30pm

## 2020-08-09 LAB — CBC
HCT: 34.8 % — ABNORMAL LOW (ref 39.0–52.0)
Hemoglobin: 11.5 g/dL — ABNORMAL LOW (ref 13.0–17.0)
MCH: 27.6 pg (ref 26.0–34.0)
MCHC: 33 g/dL (ref 30.0–36.0)
MCV: 83.5 fL (ref 80.0–100.0)
Platelets: 167 10*3/uL (ref 150–400)
RBC: 4.17 MIL/uL — ABNORMAL LOW (ref 4.22–5.81)
RDW: 12.9 % (ref 11.5–15.5)
WBC: 8.2 10*3/uL (ref 4.0–10.5)
nRBC: 0 % (ref 0.0–0.2)

## 2020-08-09 MED ORDER — SODIUM CHLORIDE 0.9 % IV BOLUS
500.0000 mL | Freq: Once | INTRAVENOUS | Status: AC
  Administered 2020-08-09: 500 mL via INTRAVENOUS

## 2020-08-09 NOTE — Progress Notes (Signed)
Derek Cardenas Progress Note  3 Days Post-Op  Subjective: CC-  Feeling ok but not as well as yesterday. Continues to have some abdominal discomfort. JP drain leaking around tubing. Tolerating diet without nausea or vomiting. BM x2 yesterday. WBC 8.2, afebrile. BP low this AM. Denies dizziness, CP, SOB.  Objective: Vital signs in last 24 hours: Temp:  [97.7 F (36.5 C)-98.5 F (36.9 C)] 98.1 F (36.7 C) (04/23 0426) Pulse Rate:  [51-101] 52 (04/23 0426) Resp:  [16-18] 17 (04/23 0426) BP: (99-123)/(54-83) 99/54 (04/23 0426) SpO2:  [93 %-96 %] 93 % (04/23 0426) Last BM Date: 08/08/20  Intake/Output from previous day: 04/22 0701 - 04/23 0700 In: 1200 [P.O.:1200] Out: 650 [Urine:550; Drains:100] Intake/Output this shift: Total I/O In: 240 [P.O.:240] Out: 200 [Urine:200]  PE: Gen: Alert, NAD, pleasant Pulm: rate and effort normal Abd: Soft,minimal distension, mild TTP RLQ, +BS,lap incisions cdi, JP drain with serosanguinous fluid in bulb and on dressing  Lab Results:  Recent Labs    08/08/20 0202 08/09/20 0137  WBC 12.2* 8.2  HGB 11.8* 11.5*  HCT 34.9* 34.8*  PLT 140* 167   BMET Recent Labs    08/07/20 0154 08/08/20 0202  NA 136 136  K 4.1 3.9  CL 105 108  CO2 24 20*  GLUCOSE 139* 126*  BUN 12 16  CREATININE 1.08 0.98  CALCIUM 8.3* 8.0*   PT/INR No results for input(s): LABPROT, INR in the last 72 hours. CMP     Component Value Date/Time   NA 136 08/08/2020 0202   K 3.9 08/08/2020 0202   CL 108 08/08/2020 0202   CO2 20 (L) 08/08/2020 0202   GLUCOSE 126 (H) 08/08/2020 0202   BUN 16 08/08/2020 0202   CREATININE 0.98 08/08/2020 0202   CALCIUM 8.0 (L) 08/08/2020 0202   PROT 7.3 08/06/2020 0959   ALBUMIN 4.3 08/06/2020 0959   AST 24 08/06/2020 0959   ALT 20 08/06/2020 0959   ALKPHOS 68 08/06/2020 0959   BILITOT 2.1 (H) 08/06/2020 0959   GFRNONAA >60 08/08/2020 0202   GFRAA >90 03/13/2013 0700   Lipase     Component Value Date/Time    LIPASE 22 08/06/2020 0959       Studies/Results: No results found.  Anti-infectives: Anti-infectives (From admission, onward)   Start     Dose/Rate Route Frequency Ordered Stop   08/06/20 1700  piperacillin-tazobactam (ZOSYN) IVPB 3.375 g        3.375 g 12.5 mL/hr over 240 Minutes Intravenous Every 8 hours 08/06/20 1602     08/06/20 1245  piperacillin-tazobactam (ZOSYN) IVPB 3.375 g        3.375 g 100 mL/hr over 30 Minutes Intravenous  Once 08/06/20 1237 08/06/20 1321       Assessment/Plan HTN GERD Diverticulosis Right inguinal hernia - contains small bowel but no evidence of obstruction Right kidney mass - concerning for primary RCC, will need urology evaluation. Info faxed to their office and they should contact pt with appointment ABL anemia - Hgb 11.8 from 14.3, recheck CBC in AM  Acute appendicitis with generalized peritonitis and abscess S/p laparoscopic appendectomy 4/20 Dr. Barry Dienes - POD#3 - needs at least 10 days of antibiotics, continue IV zosyn during admission - continue JP drain for now, may be able to d/c prior to discharge if output serous - Give 500cc fluid bolus this morning for low BP. WBC WNL and patient afebrile but having some increased discomfort today compared to yesterday. Recommend monitoring for 24 more hours  on IV antibiotics. Continue mobilizing. Possibly home tomorrow.  ID -[id number]/[id number]>> VTE -SCDs, lovenox FEN - reg diet Foley -d/c 4/22 and voiding   LOS: 3 days    Derek Cardenas, [facility] C[facility]as 08/09/2020, 8:40 AM Please see Amion for pager number during day hours 7:00am-4:30pm

## 2020-08-10 LAB — CBC
HCT: 38.3 % — ABNORMAL LOW (ref 39.0–52.0)
Hemoglobin: 12.9 g/dL — ABNORMAL LOW (ref 13.0–17.0)
MCH: 28.1 pg (ref 26.0–34.0)
MCHC: 33.7 g/dL (ref 30.0–36.0)
MCV: 83.4 fL (ref 80.0–100.0)
Platelets: 195 10*3/uL (ref 150–400)
RBC: 4.59 MIL/uL (ref 4.22–5.81)
RDW: 13.1 % (ref 11.5–15.5)
WBC: 7.6 10*3/uL (ref 4.0–10.5)
nRBC: 0 % (ref 0.0–0.2)

## 2020-08-10 MED ORDER — POLYETHYLENE GLYCOL 3350 17 G PO PACK: 17.0000 g | PACK | Freq: Every day | ORAL | 0 refills | Status: DC | PRN

## 2020-08-10 MED ORDER — ACETAMINOPHEN 500 MG PO TABS: 1000.0000 mg | ORAL_TABLET | Freq: Three times a day (TID) | ORAL | 0 refills | Status: DC | PRN

## 2020-08-10 MED ORDER — OXYCODONE HCL 5 MG PO TABS: 5.0000 mg | ORAL_TABLET | Freq: Four times a day (QID) | ORAL | 0 refills | Status: DC | PRN

## 2020-08-10 MED ORDER — AMOXICILLIN-POT CLAVULANATE 875-125 MG PO TABS: 1.0000 | ORAL_TABLET | Freq: Two times a day (BID) | ORAL | 0 refills | Status: AC

## 2020-08-10 NOTE — Discharge Summary (Signed)
[facility] Discharge Summary   Patient ID: Derek Cardenas MRN: [id number] DOB/AGE: May 15, 1950 70 y.o.  Admit date: 08/06/2020 Discharge date: 08/10/2020  Admitting Diagnosis: Acute appendicitis  Discharge Diagnosis Acute appendicitis with generalized peritonitis and abscess  Consultants None  Imaging: No results found.  Procedures Dr. Barry Dienes (08/06/2020) - Laparoscopic appendectomy  Hospital Course:  Derek Cardenas is a 70yo male who was transferred from [facility] to [facility] 4/20 for evaluation of appendicitis.  Workup included CT scan which showed acute appendicitis with phlegmonous inflammation surrounding the appendix but no periappendiceal abscess or signs of frank perforation.  Patient was admitted and underwent procedure listed above.  Intraoperatively he was found to have acute appendicitis with generalized peritonitis and abscess. Tolerated procedure well and was transferred to the floor.  He was kept on IV zosyn during admission. Once bowel function returned diet was advanced as tolerated.  WBC normalized and JP drain output remained serosanguinous. Drain removed on 4/24. On POD4, the patient was voiding well, tolerating diet, ambulating well, pain well controlled, vital signs stable, incisions c/d/i and felt stable for discharge home. He will go home with 7 days of augmentin to complete a 10 day course of antibiotics.  Patient will follow up as below and knows to call with questions or concerns.   He was incidentally noted to have a right kidney mass on imaging; urology follow up arranged.  I have personally reviewed the patients medication history on the [place] controlled substance database.    Physical Exam: Gen: Alert, NAD, pleasant Pulm: rate and effort normal Abd: Soft, nondistended, nontender,+BS,lap incisions cdi, JP drain with serosanguinous fluid in bulb  Allergies as of 08/10/2020   No Known Allergies     Medication List    TAKE these medications    acetaminophen 500 MG tablet Commonly known as: TYLENOL Take 2 tablets (1,000 mg total) by mouth every 8 (eight) hours as needed for mild pain.   amLODipine 10 MG tablet Commonly known as: NORVASC Take 10 mg by mouth daily.   amoxicillin-clavulanate 875-125 MG tablet Commonly known as: Augmentin Take 1 tablet by mouth 2 (two) times daily for 7 days.   omeprazole 40 MG capsule Commonly known as: PRILOSEC Take 40 mg by mouth daily.   oxyCODONE 5 MG immediate release tablet Commonly known as: Oxy IR/ROXICODONE Take 1 tablet (5 mg total) by mouth every 6 (six) hours as needed for severe pain.   polyethylene glycol 17 g packet Commonly known as: MIRALAX / GLYCOLAX Take 17 g by mouth daily as needed for mild constipation.         Follow-up Information    [facility]. Call.   Why: To confirm your appointment to discuss your right kidney mass  Contact information: [address] [place]       Aletha Halim., PA-C Follow up.   Specialty: Family Medicine Contact information: [address] [place] [address] [facility], [place]. Go on 08/26/2020.   Specialty: General Surgery Why: Your appointment is 08/26/20 at 9:45am Please arrive 30 minutes prior to your appointment to check in and fill out paperwork. Bring photo ID and insurance information. Contact information: [address] [place] [phone]              Signed: Wellington Hampshire, [facility] 08/10/2020, 8:42 AM Please see Amion for pager  number during day hours 7:00am-4:30pm

## 2020-08-21 ENCOUNTER — Other Ambulatory Visit (HOSPITAL_COMMUNITY): Payer: Self-pay | Admitting: Urology

## 2020-08-21 ENCOUNTER — Other Ambulatory Visit: Payer: Self-pay | Admitting: Urology

## 2020-08-21 DIAGNOSIS — D4111 Neoplasm of uncertain behavior of right renal pelvis: Secondary | ICD-10-CM

## 2020-08-21 DIAGNOSIS — D49519 Neoplasm of unspecified behavior of unspecified kidney: Secondary | ICD-10-CM

## 2020-08-22 ENCOUNTER — Other Ambulatory Visit: Payer: Self-pay | Admitting: Urology

## 2020-08-22 DIAGNOSIS — D49511 Neoplasm of unspecified behavior of right kidney: Secondary | ICD-10-CM

## 2020-09-05 ENCOUNTER — Ambulatory Visit (HOSPITAL_COMMUNITY)
Admission: RE | Admit: 2020-09-05 | Discharge: 2020-09-05 | Disposition: A | Discharge status: Home / Self Care | Payer: Medicare Other | Source: Ambulatory Visit | Attending: Urology | Admitting: Urology

## 2020-09-05 ENCOUNTER — Other Ambulatory Visit: Payer: Self-pay

## 2020-09-05 ENCOUNTER — Encounter (HOSPITAL_COMMUNITY): Payer: Self-pay

## 2020-09-05 DIAGNOSIS — D49511 Neoplasm of unspecified behavior of right kidney: Secondary | ICD-10-CM

## 2020-09-05 HISTORY — DX: Malignant (primary) neoplasm, unspecified: C80.1

## 2020-09-05 IMAGING — MR MR ABDOMEN WO/W CM
18 series · 48 of 48 positions shown · IV contrast (GADAVIST)
Comparison: CT abdomen pelvis, [DATE]

CLINICAL DATA: New diagnosis right renal cell carcinoma

EXAM:
MRI ABDOMEN WITHOUT AND WITH CONTRAST
TECHNIQUE: Multiplanar multisequence MR imaging of the abdomen was performed
both before and after the administration of intravenous contrast.
CONTRAST:  7.5mL GADAVIST GADOBUTROL 1 MMOL/ML IV SOLN

[Series 3: T2 · coronal · 6.0mm · 1.56mm/px · 2 of 32 slices shown (1 of 2)]
[im 1/32]
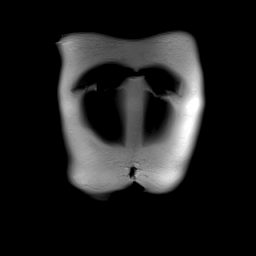
[im 32/32]
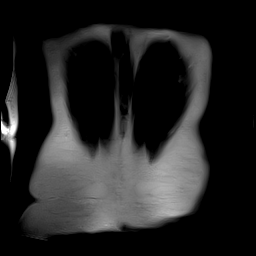

[Series 4: T1 · axial · 3.0mm · 1.12mm/px · z∈[-95,+118]mm · 4 of 72 slices shown (1 of 2)]
[im 1/72]
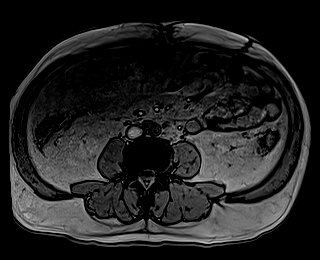
[im 24/72]
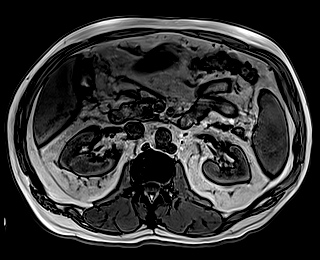
[im 48/72]
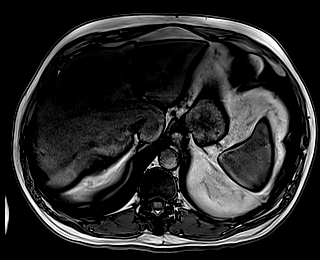
[im 72/72]
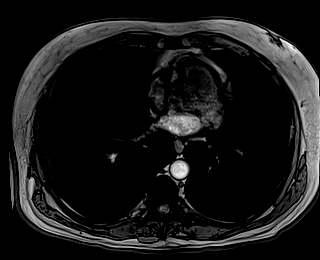

[Series 5: T1 · axial · 3.0mm · 1.12mm/px · z∈[-95,+118]mm · 4 of 72 slices shown (2 of 2)]
[im 1/72]
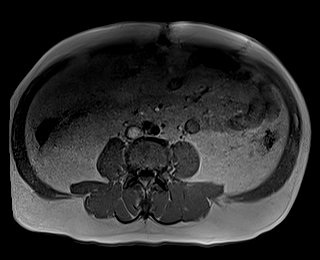
[im 24/72]
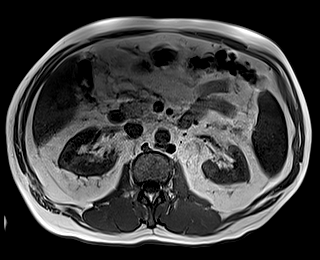
[im 48/72]
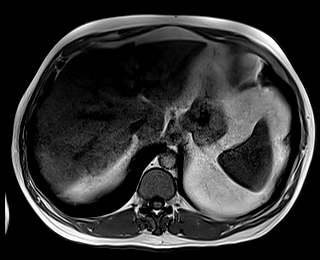
[im 72/72]
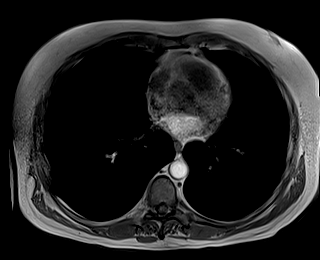

[Series 6: bSSFP · axial · 5.0mm · 0.70mm/px · z∈[-113,+101]mm · 2 of 40 slices shown]
[im 1/40]
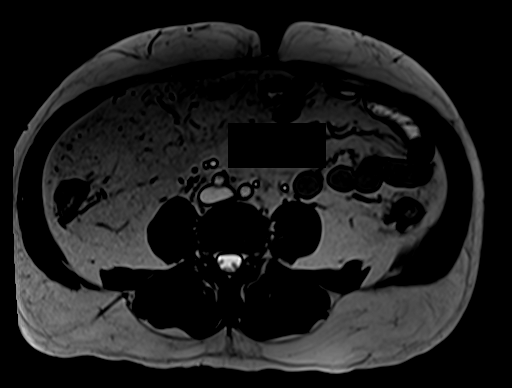
[im 40/40]
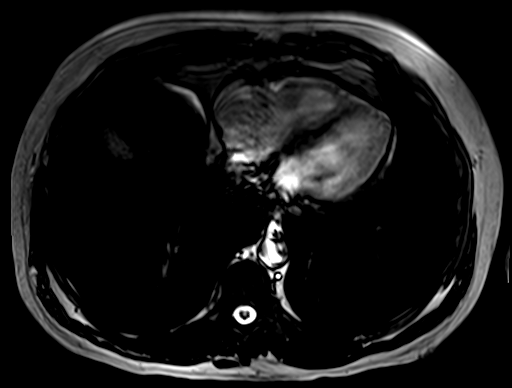

[Series 7: DWI · axial · 6.0mm · 1.49mm/px · z∈[-77,+175]mm · 3 of 72 slices shown (1 of 2)]
[im 1/72]
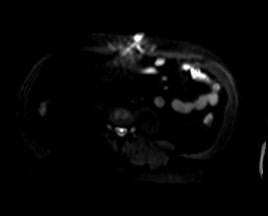
[im 36/72]
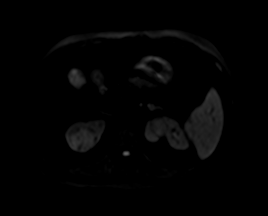
[im 72/72]
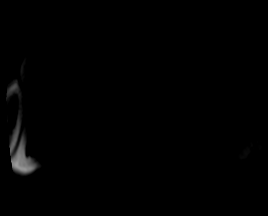

[Series 8: DWI · axial · 6.0mm · 1.49mm/px · 1 of 36 slices shown (2 of 2)]
[im 1/36]
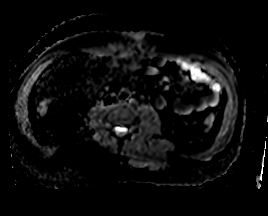

[Series 10: T2 fat-sat · axial · 6.0mm · 1.12mm/px · 1 of 36 slices shown]
[im 1/36]
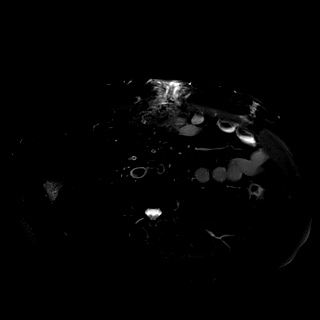

[Series 13: T1 dynamic · axial · 3.0mm · 1.12mm/px · z∈[-115,+122]mm · 3 of 80 slices shown (1 of 10)]
[im 1/80]
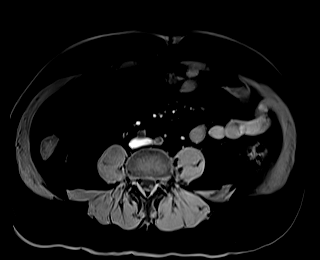
[im 40/80]
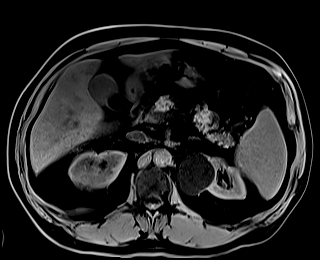
[im 80/80]
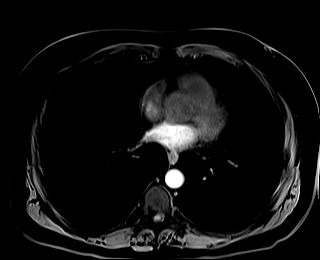

[Series 17: T1 dynamic · axial · 3.0mm · 1.12mm/px · z∈[-115,+122]mm · 3 of 80 slices shown (2 of 10)]
[im 1/80]
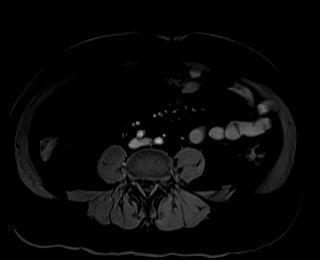
[im 40/80]
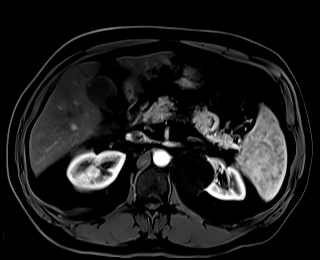
[im 80/80]
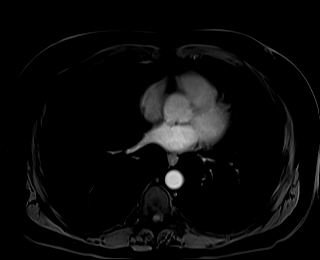

[Series 18: T1 dynamic · axial · 3.0mm · 1.12mm/px · z∈[-115,+122]mm · 3 of 80 slices shown (3 of 10)]
[im 1/80]
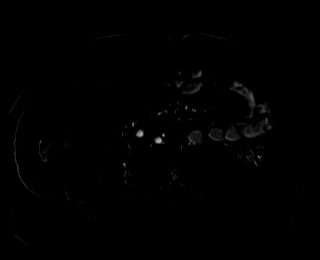
[im 40/80]
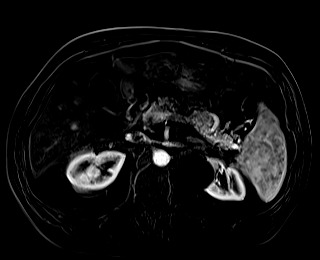
[im 80/80]
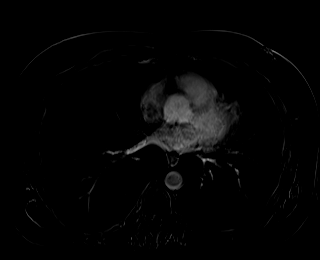

[Series 21: T1 dynamic · axial · 3.0mm · 1.12mm/px · z∈[-115,+122]mm · 3 of 80 slices shown (4 of 10)]
[im 1/80]
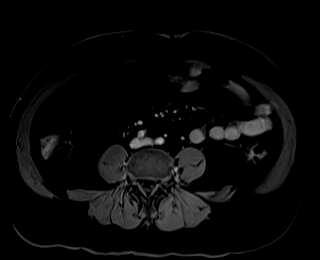
[im 40/80]
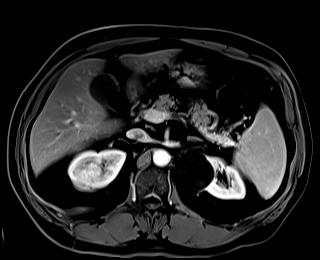
[im 80/80]
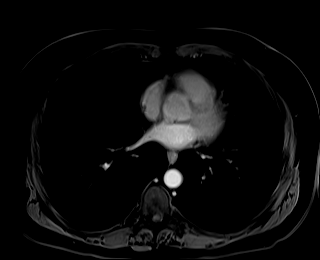

[Series 22: T1 dynamic · axial · 3.0mm · 1.12mm/px · z∈[-115,+122]mm · 3 of 80 slices shown (5 of 10)]
[im 1/80]
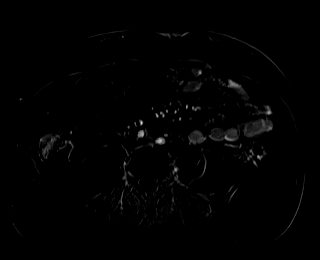
[im 40/80]
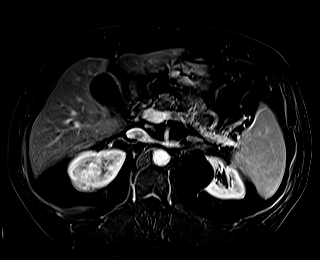
[im 80/80]
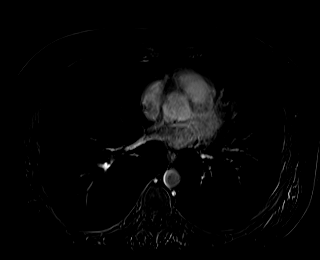

[Series 25: T1 dynamic · axial · 3.0mm · 1.12mm/px · z∈[-115,+122]mm · 3 of 80 slices shown (6 of 10)]
[im 1/80]
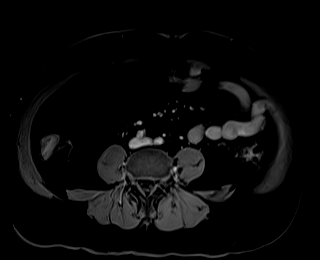
[im 40/80]
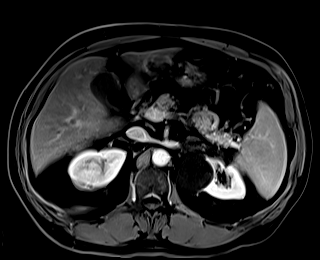
[im 80/80]
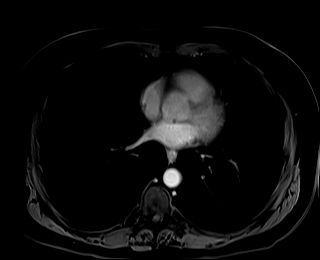

[Series 26: T1 dynamic · axial · 3.0mm · 1.12mm/px · z∈[-115,+122]mm · 3 of 80 slices shown (7 of 10)]
[im 1/80]
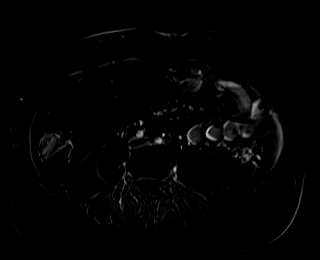
[im 40/80]
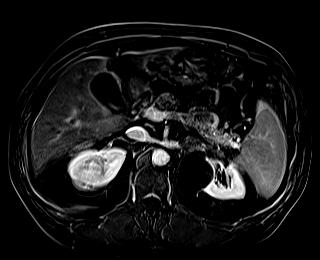
[im 80/80]
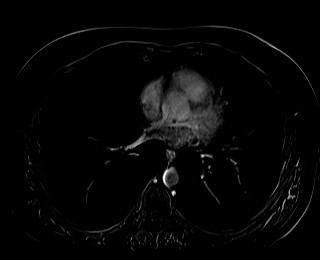

[Series 28: T1 dynamic · coronal · 3.0mm · 1.41mm/px · 3 of 72 slices shown (8 of 10)]
[im 1/72]
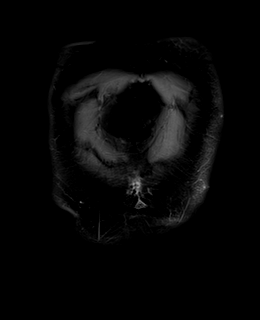
[im 36/72]
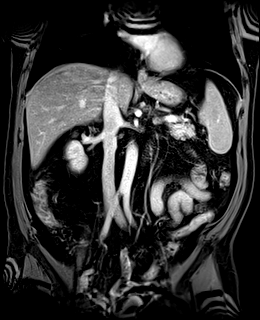
[im 72/72]
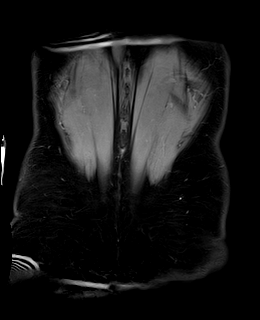

[Series 29: T2 · axial · 6.0mm · 1.41mm/px · 1 of 30 slices shown (2 of 2)]
[im 1/30]
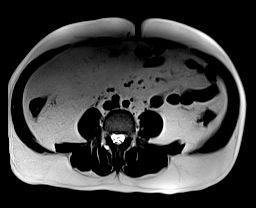

[Series 32: T1 dynamic · axial · 3.0mm · 1.12mm/px · z∈[-115,+122]mm · 3 of 80 slices shown (9 of 10)]
[im 1/80]
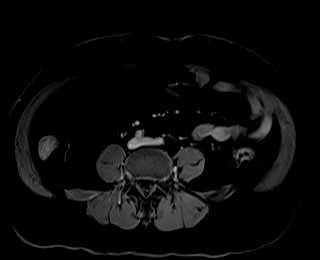
[im 40/80]
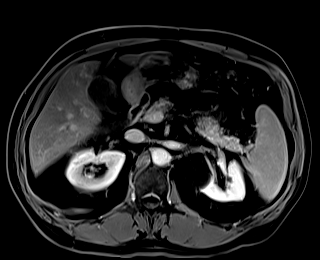
[im 80/80]
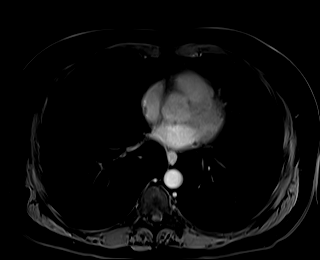

[Series 33: T1 dynamic · axial · 3.0mm · 1.12mm/px · z∈[-115,+122]mm · 3 of 80 slices shown (10 of 10)]
[im 1/80]
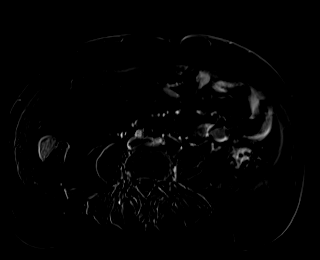
[im 40/80]
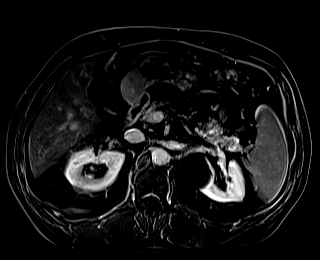
[im 80/80]
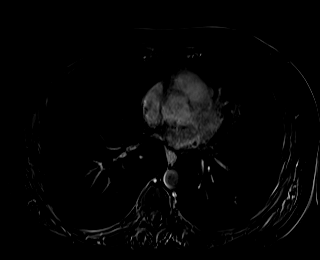

[48 of 48 positions shown; findings below may reference images not displayed]

FINDINGS: Lower chest: No acute findings.

Hepatobiliary: No mass or other parenchymal abnormality identified.
Large gallstone in the gallbladder. No gallbladder wall thickening.
No biliary ductal dilatation.

Pancreas: No mass, inflammatory changes, or other parenchymal
abnormality identified.

Spleen:  Within normal limits in size and appearance.

Adrenals/Urinary Tract: Redemonstrated contrast enhancing mass of
the superior pole of the right kidney measuring 4.7 x 4.0 cm (series
17, image 34). No evidence of renal vein invasion. Benign, exophytic
cyst of the medial, superior pole of the left kidney. No evidence of
hydronephrosis.

Stomach/Bowel: Colonic diverticulosis.

Vascular/Lymphatic: No pathologically enlarged lymph nodes
identified. Fluid signal cyst or lymphocele posterior to the aorta
(series 10, image 27). No abdominal aortic aneurysm demonstrated.

Other:  None.

Musculoskeletal: No suspicious bone lesions identified.
IMPRESSION: 1. Redemonstrated contrast enhancing mass of the superior pole of
the right kidney measuring 4.7 x 4.0 cm, consistent with renal cell
carcinoma.
2. No evidence of renal vein invasion, lymphadenopathy, or
metastatic disease in the abdomen.
3. Cholelithiasis.
4. Colonic diverticulosis.

## 2020-09-05 IMAGING — CT CT CHEST W/O CM
2 of 4 series · 15 of 36 positions shown, 18 images · non-contrast
Comparison: MR abdomen, [DATE], CT abdomen pelvis, [DATE]

CLINICAL DATA: New diagnosis right renal cell carcinoma, rule out
chest metastatic disease

EXAM:
CT CHEST WITHOUT CONTRAST
TECHNIQUE: Multidetector CT imaging of the chest was performed following the
standard protocol without IV contrast.

[Series 2: thorax · axial · 0.85mm/px · z∈[+1308,+1570]mm · 12 of 155 slices shown, 15 images]
[im 12/155  mediastinal]
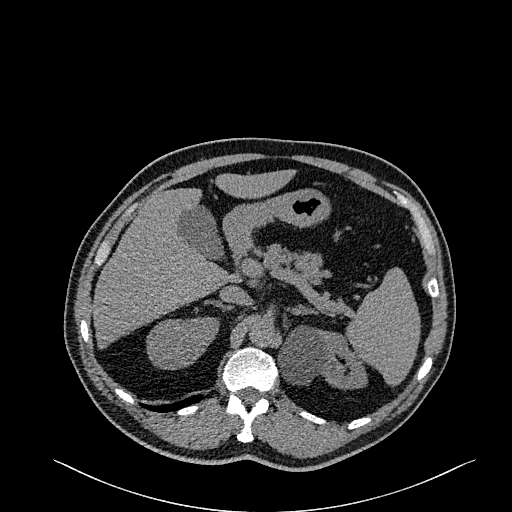
[im 12/155  lung]
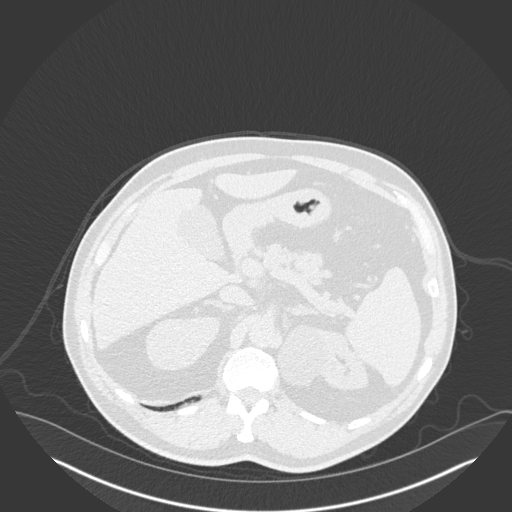
[im 24/155  lung]
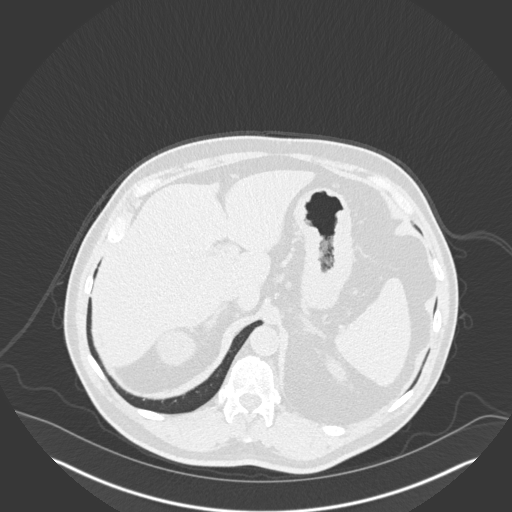
[im 36/155  lung]
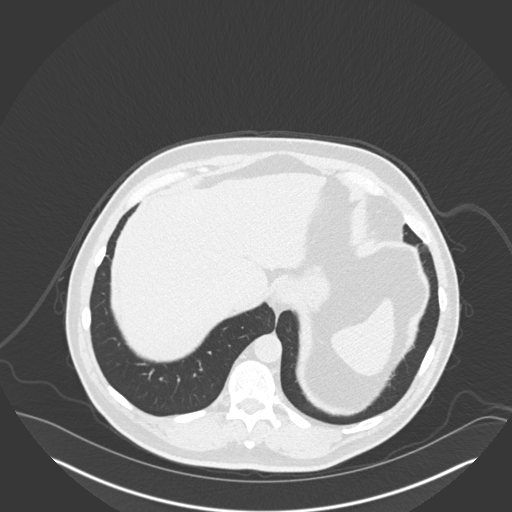
[im 48/155  lung]
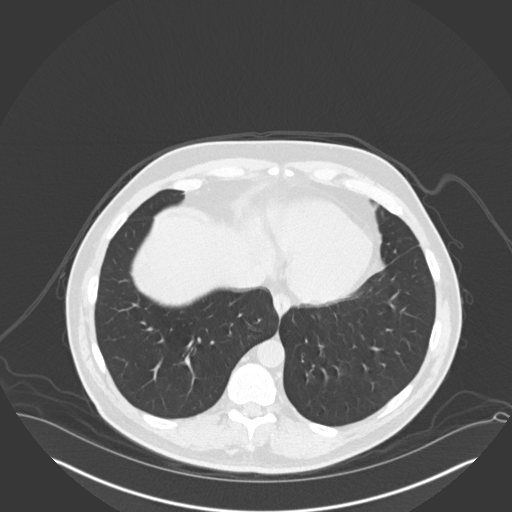
[im 60/155  mediastinal]
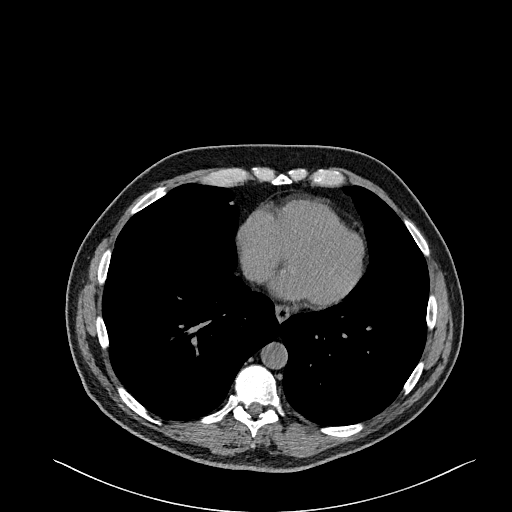
[im 60/155  lung]
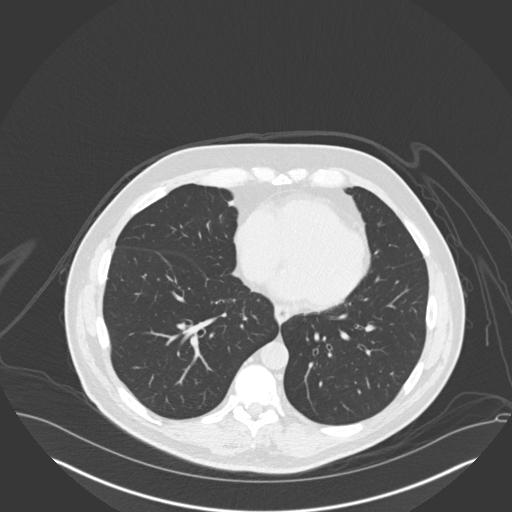
[im 72/155  lung]
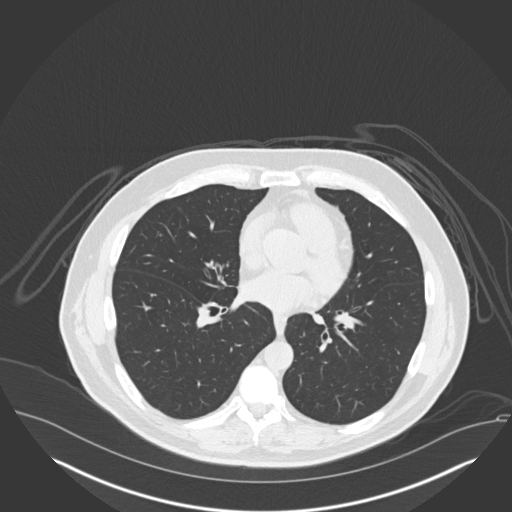
[im 83/155  lung]
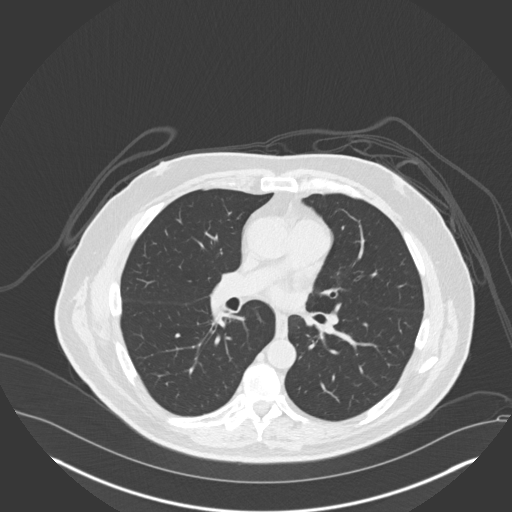
[im 95/155  lung]
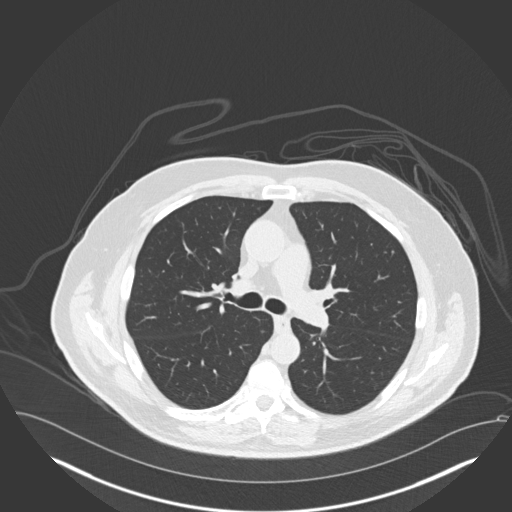
[im 107/155  mediastinal]
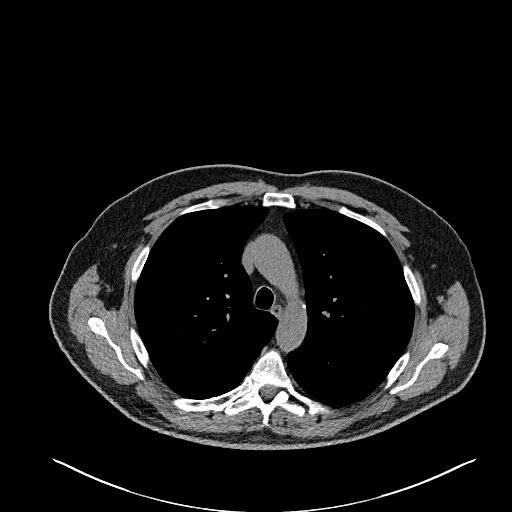
[im 107/155  lung]
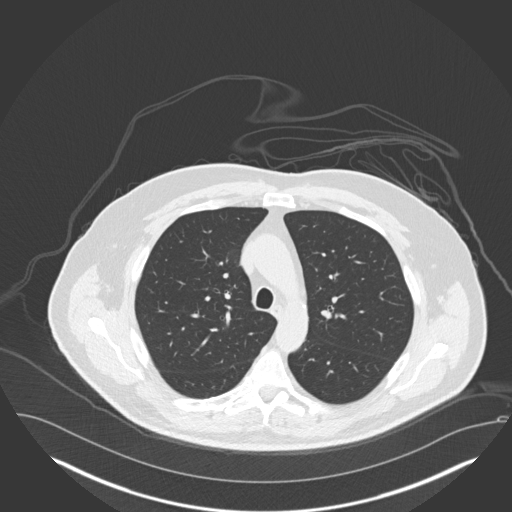
[im 119/155  lung]
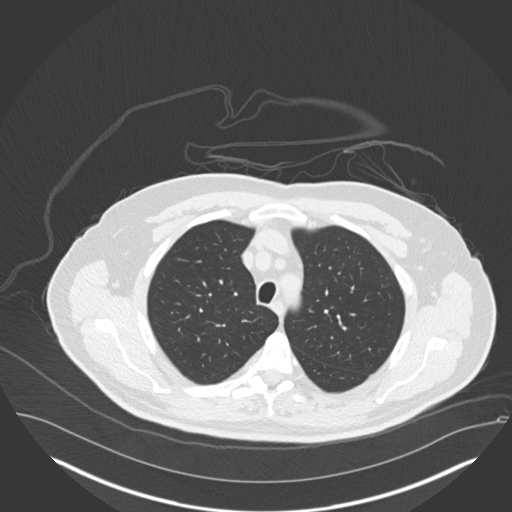
[im 131/155  lung]
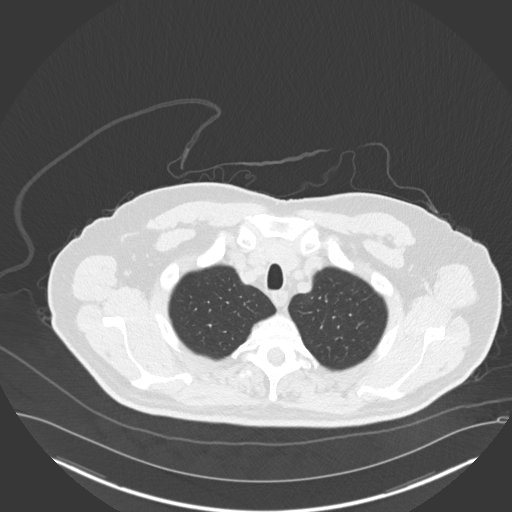
[im 143/155  lung]
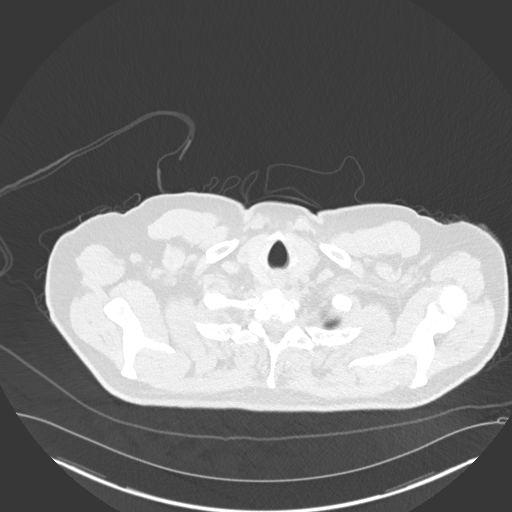

[Series 5: coronal · coronal · 0.60mm/px · 3 of 137 slices shown]
[im 28/137  lung]
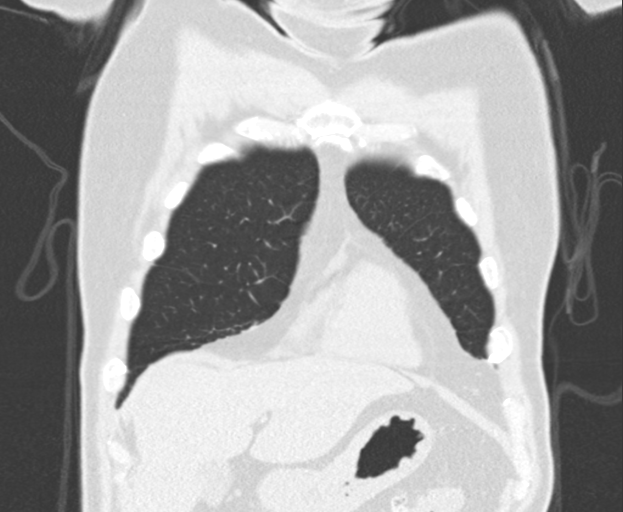
[im 55/137  lung]
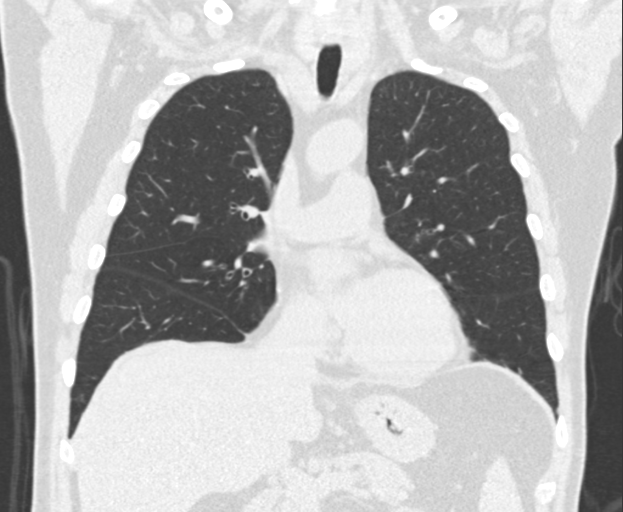
[im 82/137  lung]
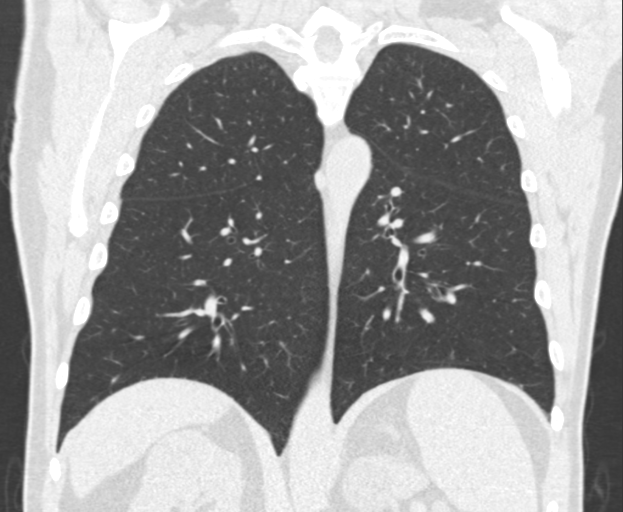

[15 of 36 positions shown; findings below may reference images not displayed]

FINDINGS: Cardiovascular: No significant vascular findings. Normal heart size.
No pericardial effusion.

Mediastinum/Nodes: No enlarged mediastinal, hilar, or axillary lymph
nodes. Thyroid gland, trachea, and esophagus demonstrate no
significant findings.

Lungs/Pleura: Occasional tiny pulmonary nodules of the right lung,
for example a 3 mm nodule of the azygoesophageal recess of the
superior segment right lower lobe (series 7, image 60) and a 2 mm
nodule of the lateral segment right middle lobe (series 7, image
83). There is a background of very fine centrilobular nodularity,
most concentrated in the lung apices. Mild, diffuse bilateral
bronchial wall thickening. No pleural effusion or pneumothorax.

Upper Abdomen: No acute abnormality. Hypodense mass of the superior
pole of the right kidney, better characterized as renal cell
carcinoma by prior imaging.

Musculoskeletal: No chest wall mass or suspicious bone lesions
identified.
IMPRESSION: 1. Occasional tiny pulmonary nodules of the right lung, for example
a 3 mm nodule of the azygoesophageal recess of the superior segment
right lower lobe and a 2 mm nodule of the lateral segment right
middle lobe. These are nonspecific and most likely benign sequelae
of prior infection or inflammation, pulmonary metastatic disease not
favored but not strictly excluded.
2. There is a background of very fine centrilobular nodularity, most
concentrated in the lung apices. Mild, diffuse bilateral bronchial
wall thickening. Findings are most consistent with smoking-related
respiratory bronchiolitis.
3. Hypodense mass of the superior pole of the right kidney, better
characterized as renal cell carcinoma by prior imaging.

## 2020-09-05 MED ORDER — GADOBUTROL 1 MMOL/ML IV SOLN
7.5000 mL | Freq: Once | INTRAVENOUS | Status: AC | PRN
  Administered 2020-09-05: 7.5 mL via INTRAVENOUS

## 2020-09-18 NOTE — Patient Instructions (Addendum)
DUE TO COVID-19 ONLY ONE VISITOR IS ALLOWED TO COME WITH YOU AND STAY IN THE WAITING ROOM ONLY DURING PRE OP AND PROCEDURE DAY OF SURGERY. THE 2 VISITORS  MAY VISIT WITH YOU AFTER SURGERY IN YOUR PRIVATE ROOM DURING VISITING HOURS ONLY!  YOU NEED TO HAVE A COVID 19 TEST ON__6/9_____ @_2 :20 pm______, THIS TEST MUST BE DONE BEFORE SURGERY,  COVID TESTING SITE [place] [address], IT IS ON THE RIGHT GOING OUT [address] APPROXIMATELY  2 MINUTES PAST [employer] ON THE RIGHT. ONCE YOUR COVID TEST IS COMPLETED,  PLEASE BEGIN THE QUARANTINE INSTRUCTIONS AS OUTLINED IN YOUR HANDOUT.                Derek Cardenas    Your procedure is scheduled on: 09/29/20   Report to [facility] Main  Entrance   Report to short stay at 5:15 AM     Call this number if you have problems the morning of surgery [name].     Take these medicines the morning of surgery with A SIP OF WATER: Amlodipine, Omeprazole                                You may not have any metal on your body including              piercings  Do not wear jewelry,  lotions, powders or deodorant              Men may shave face and neck.   Do not bring valuables to the hospital. [place].  Contacts, dentures or bridgework may not be worn into surgery.       Special Instructions: N/A              Please read over the following fact sheets you were given: _____________________________________________________________________             [facility] - Preparing for Surgery Before surgery, you can play an important role.  Because skin is not sterile, your skin needs to be as free of germs as possible.  You can reduce the number of germs on your skin by washing with CHG (chlorahexidine gluconate) soap before surgery.  CHG is an antiseptic cleaner  which kills germs and bonds with the skin to continue killing germs even after washing. Please DO NOT use if you have an allergy to CHG or antibacterial soaps.  If your skin becomes reddened/irritated stop using the CHG and inform your nurse when you arrive at Short Stay.   You may shave your face/neck.  Please follow these instructions carefully:  1.  Shower with CHG Soap the night before surgery and the  morning of Surgery.  2.  If you choose to wash your hair, wash your hair first as usual with your  normal  shampoo.  3.  After you shampoo, rinse your hair and body thoroughly to remove the  shampoo.                                        4.  Use CHG  as you would any other liquid soap.  You can apply chg directly  to the skin and wash                       Gently with a scrungie or clean washcloth.  5.  Apply the CHG Soap to your body ONLY FROM THE NECK DOWN.   Do not use on face/ open                           Wound or open sores. Avoid contact with eyes, ears mouth and genitals (private parts).                       Wash face,  Genitals (private parts) with your normal soap.             6.  Wash thoroughly, paying special attention to the area where your surgery  will be performed.  7.  Thoroughly rinse your body with warm water from the neck down.  8.  DO NOT shower/wash with your normal soap after using and rinsing off  the CHG Soap.             9.  Pat yourself dry with a clean towel.            10.  Wear clean pajamas.            11.  Place clean sheets on your bed the night of your first shower and do not  sleep with pets. Day of Surgery : Do not apply any lotions/deodorants the morning of surgery.  Please wear clean clothes to the hospital/surgery center.  FAILURE TO FOLLOW THESE INSTRUCTIONS MAY RESULT IN THE CANCELLATION OF YOUR SURGERY PATIENT SIGNATURE_________________________________  NURSE  SIGNATURE__________________________________  ________________________________________________________________________

## 2020-09-19 ENCOUNTER — Encounter (HOSPITAL_COMMUNITY): Payer: Self-pay

## 2020-09-19 ENCOUNTER — Encounter (HOSPITAL_COMMUNITY)
Admission: RE | Admit: 2020-09-19 | Discharge: 2020-09-19 | Disposition: A | Discharge status: Home / Self Care | Payer: Medicare Other | Source: Ambulatory Visit | Attending: Urology | Admitting: Urology

## 2020-09-19 ENCOUNTER — Other Ambulatory Visit: Payer: Self-pay

## 2020-09-19 DIAGNOSIS — Z01812 Encounter for preprocedural laboratory examination: Secondary | ICD-10-CM | POA: Diagnosis present

## 2020-09-19 LAB — BASIC METABOLIC PANEL
Anion gap: 7 (ref 5–15)
BUN: 15 mg/dL (ref 8–23)
CO2: 24 mmol/L (ref 22–32)
Calcium: 8.8 mg/dL — ABNORMAL LOW (ref 8.9–10.3)
Chloride: 105 mmol/L (ref 98–111)
Creatinine, Ser: 1.08 mg/dL (ref 0.61–1.24)
GFR, Estimated: >60 mL/min (ref >60)
Glucose, Bld: 102 mg/dL — ABNORMAL HIGH (ref 70–99)
Potassium: 3.8 mmol/L (ref 3.5–5.1)
Sodium: 136 mmol/L (ref 135–145)

## 2020-09-19 LAB — CBC
HCT: 45.8 % (ref 39.0–52.0)
Hemoglobin: 15.8 g/dL (ref 13.0–17.0)
MCH: 28 pg (ref 26.0–34.0)
MCHC: 34.5 g/dL (ref 30.0–36.0)
MCV: 81.2 fL (ref 80.0–100.0)
Platelets: 218 10*3/uL (ref 150–400)
RBC: 5.64 MIL/uL (ref 4.22–5.81)
RDW: 13.4 % (ref 11.5–15.5)
WBC: 8.1 10*3/uL (ref 4.0–10.5)
nRBC: 0 % (ref 0.0–0.2)

## 2020-09-22 NOTE — Progress Notes (Signed)
COVID Vaccine Completed:Yes Date COVID Vaccine completed:05/27/19 COVID vaccine manufacturer: [place]  PCP - Bing Matter PA Cardiologist -no   Chest x-ray - no EKG - 08/06/20-epic Stress Test - no ECHO - no Cardiac Cath - no Pacemaker/ICD device last checked:NA  Sleep Study - no CPAP -   Fasting Blood Sugar -NA  Checks Blood Sugar _____ times a day  Blood Thinner Instructions:NA Aspirin Instructions: Last Dose:  Anesthesia review:   Patient denies shortness of breath, fever, cough and chest pain at PAT appointment Yes. Pt reports that he can climb stairs, do housework and ADLs without SOB.   Patient verbalized understanding of instructions that were given to them at the PAT appointment. Patient was also instructed that they will need to review over the PAT instructions again at home before surgery.Yes

## 2020-09-25 ENCOUNTER — Other Ambulatory Visit (HOSPITAL_COMMUNITY)
Admission: RE | Admit: 2020-09-25 | Discharge: 2020-09-25 | Disposition: A | Discharge status: Home / Self Care | Payer: Medicare Other | Source: Ambulatory Visit | Attending: Urology | Admitting: Urology

## 2020-09-25 DIAGNOSIS — Z20822 Contact with and (suspected) exposure to covid-19: Secondary | ICD-10-CM | POA: Insufficient documentation

## 2020-09-25 DIAGNOSIS — Z01812 Encounter for preprocedural laboratory examination: Secondary | ICD-10-CM | POA: Insufficient documentation

## 2020-09-25 LAB — SARS CORONAVIRUS 2 (TAT 6-24 HRS): SARS Coronavirus 2: NEGATIVE

## 2020-09-26 NOTE — H&P (Signed)
CC: Right renal neoplasm    Derek Cardenas is a 70 year old gentleman who presented to the emergency room on 08/06/20 with right lower quadrant pain. CT imaging of the abdomen and pelvis with contrast demonstrated findings consistent with acute appendicitis. Incidentally, he was noted to have a 4.6 cm hyperdense mass of the right kidney concerning for renal malignancy. He did undergo an emergent laparoscopic appendectomy for a ruptured appendix. He did require drain postoperatively. His past surgical history is significant for a prior lower midline incision for partial colectomy for diverticulitis. He was apparently noted to have extensive adhesions noted during his most recent procedure.   Family history of kidney cancer: None.  Family history of ESRD: None.   Imaging: CT abdomen and pelvis (08/06/20)  Side of renal neoplasm: Right  Size of renal neoplasm: 4.6 cm  Location of renal neoplasm: Right upper pole  Exophytic or endophytic: Mostly endophytic  Renal nephrometry score: 9p   Renal artery anatomy: Single early branching renal artery  Renal vein anatomy: Single renal vein   Contralateral renal lesions: 4.7 cm simple left renal cyst  Regional lymphadenopathy: None.  Adrenal masses: None.  Renal vein/IVC involvement: No.  Metastatic disease to the abdomen: No.   Chest imaging: PENDING  LFTs: Normal.   Baseline renal function: Cr 0.7, eGFR >60 ml/min   PMH: Past medical history is significant for hypertension and GERD.  PSH: Lower midline incision for partial colectomy related to diverticulitis in 1999, recent laparoscopic appendectomy for a ruptured appendix in April 2022.     ALLERGIES: No Known Drug Allergies    MEDICATIONS: Omeprazole  Amlodipine Besylate 10 mg tablet     GU PSH: None   NON-GU PSH: Appendectomy (open) - 08/19/2020     GU PMH: None   NON-GU PMH: Diverticulitis GERD Hypertension    FAMILY HISTORY: 1 Daughter - Runs in Family 2 sons - Runs in  Family Congestive Heart Failure - Father skin cancer - Mother stroke - Mother   SOCIAL HISTORY: Marital Status: Married Preferred Language: English; Ethnicity: Not Hispanic Or Latino; Race: White Current Smoking Status: Patient has never smoked.   Tobacco Use Assessment Completed: Used Tobacco in last 30 days? Has never drank.  Does not use drugs. Drinks 1 caffeinated drink per day. Patient's occupation is/was Retired.    REVIEW OF SYSTEMS:    GU Review Male:   Patient reports get up at night to urinate and stream starts and stops. Patient denies frequent urination, hard to postpone urination, burning/ pain with urination, leakage of urine, trouble starting your streams, and have to strain to urinate .  Gastrointestinal (Lower):   Patient denies diarrhea and constipation.  Gastrointestinal (Upper):   Patient denies nausea and vomiting.  Constitutional:   Patient reports fever. Patient denies night sweats, weight loss, and fatigue.  Skin:   Patient denies skin rash/ lesion and itching.  Eyes:   Patient denies blurred vision and double vision.  Ears/ Nose/ Throat:   Patient denies sinus problems and sore throat.  Hematologic/Lymphatic:   Patient reports easy bruising. Patient denies swollen glands.  Cardiovascular:   Patient denies leg swelling and chest pains.  Respiratory:   Patient denies cough and shortness of breath.  Endocrine:   Patient denies excessive thirst.  Musculoskeletal:   Patient denies back pain and joint pain.  Neurological:   Patient denies headaches and dizziness.  Psychologic:   Patient denies depression and anxiety.   VITAL SIGNS:     Weight  160 lb / 72.57 kg  Height 66 in / 167.64 cm  BMI 25.8 kg/m   MULTI-SYSTEM PHYSICAL EXAMINATION:    Constitutional: Well-nourished. No physical deformities. Normally developed. Good grooming.  Neck: Neck symmetrical, not swollen. Normal tracheal position.  Respiratory: No labored breathing, no use of accessory muscles.  Clear bilaterally.  Cardiovascular: Normal temperature, normal extremity pulses, no swelling, no varicosities. Regular rate and rhythm.  Lymphatic: No enlargement of neck, axillae, groin.  Skin: No paleness, no jaundice, no cyanosis. No lesion, no ulcer, no rash.  Neurologic / Psychiatric: Oriented to time, oriented to place, oriented to person. No depression, no anxiety, no agitation.  Gastrointestinal: No mass, no tenderness, no rigidity, non obese abdomen. Well-healed lower midline incision. Multiple healing laparoscopic incisions.  Eyes: Normal conjunctivae. Normal eyelids.  Ears, Nose, Mouth, and Throat: Left ear no scars, no lesions, no masses. Right ear no scars, no lesions, no masses. Nose no scars, no lesions, no masses. Normal hearing. Normal lips.  Musculoskeletal: Normal gait and station of head and neck.     Complexity of Data:  Records Review:   Previous Patient Records  X-Ray Review: C.T. Abdomen/Pelvis: Reviewed Films.    Notes:                     CLINICAL DATA: 70 year old male with history of right lower  quadrant abdominal pain since Monday. Suspected complicated  diverticulitis.   EXAM:  CT ABDOMEN AND PELVIS WITH CONTRAST   TECHNIQUE:  Multidetector CT imaging of the abdomen and pelvis was performed  using the standard protocol following bolus administration of  intravenous contrast.   CONTRAST: 55m OMNIPAQUE IOHEXOL 300 MG/ML SOLN   COMPARISON: CT the abdomen and pelvis 03/11/2013.   FINDINGS:  Lower chest: Unremarkable.   Hepatobiliary: No suspicious cystic or solid hepatic lesions. No  intra or extrahepatic biliary ductal dilatation. Gallbladder is  normal in appearance.   Pancreas: No pancreatic mass. No pancreatic ductal dilatation. No  pancreatic or peripancreatic fluid collections or inflammatory  changes.   Spleen: Unremarkable.   Adrenals/Urinary Tract: In the upper pole of the right kidney there  is a 4.5 x 4.3 x 4.6 cm heterogeneously  enhancing mass which is  mildly exophytic but encapsulated within Gerota's fascia and well  separated from the right renal vein which is widely patent at this  time. In the medial aspect of the upper pole the left kidney there  is an exophytic 4.7 cm low-attenuation nonenhancing lesion,  compatible with a simple cyst. No hydroureteronephrosis. Urinary  bladder is normal in appearance.   Stomach/Bowel: The appearance of the stomach is normal. There is no  pathologic dilatation of small bowel or colon. Postoperative changes  of partial colectomy are noted in the region of the mid sigmoid  colon. A short segment of distal small bowel extends into a right  inguinal hernia. Colonic diverticulosis without surrounding  inflammatory changes to clearly indicate an acute diverticulitis.   Appendix: Location: Inferior to the cecum   Diameter: 13 mm   Appendicolith: None   Mucosal hyper-enhancement: Present   Extraluminal gas: None   Periappendiceal collection: Inflammatory changes are noted adjacent  to the appendix, without a well-defined periappendiceal fluid  collection to suggest abscess.   Vascular/Lymphatic: Aortic atherosclerosis, without evidence of  aneurysm or dissection in the abdominal or pelvic vasculature. No  lymphadenopathy noted in the abdomen or pelvis.   Reproductive: Prostate gland and seminal vesicles are unremarkable  in appearance.  Other: Inflammatory changes are noted adjacent to the appendix in  the right lower quadrant of the abdomen. Small right inguinal hernia  containing a short segment of distal small bowel, as well as a trace  volume of free fluid. No larger volume of ascites. No  pneumoperitoneum.   Musculoskeletal: There are no aggressive appearing lytic or blastic  lesions noted in the visualized portions of the skeleton.   IMPRESSION:  1. Findings are compatible with an acute appendicitis. There is  phlegmonous inflammation surrounding the  appendix, but no  periappendiceal abscess or signs of frank perforation noted at this  time.  2. Small right inguinal hernia containing a short segment of distal  small bowel, without evidence of bowel incarceration or obstruction  at this time.  3. Colonic diverticulosis without evidence of acute diverticulitis  at this time.  4. 4.5 x 4.3 x 4.6 cm heterogeneously enhancing mass in the upper  pole of the right kidney which is encapsulated within Gerota's  fascia and separated from the right renal vein, highly concerning  for primary renal cell carcinoma. Nonemergent Urologic consultation  is strongly recommended.  5. Aortic atherosclerosis.   Critical Value/emergent results were called by telephone at the time  of interpretation on 08/06/2020 at 12:34 pm to provider Sherwood Gambler, who verbally acknowledged these results.    Electronically Signed  By: Vinnie Langton M.D.  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 Right renal neoplasm concerning for malignancy: I had a detailed discussion with Mr. Derek Cardenas and his wife regarding his recently incidentally detected hyperdense right renal mass concerning for renal malignancy. I have recommended that he proceed with dedicated renal imaging with an MRI with and without IV contrast to confirm the suspicion. He also will proceed with a CT of the chest to rule out metastatic disease. Assuming the studies do not indicate findings concerning for metastatic disease, I have recommended that he proceed with surgical resection of his right renal tumor.   The patient was provided information regarding their renal mass including the relative risk of benign versus malignant pathology and the natural history of renal cell carcinoma and other possible malignancies of the kidney. The role of renal biopsy, laboratory testing, and imaging studies to further characterize renal masses  and/or the presence of metastatic disease were explained. We discussed the role of active surveillance, surgical therapy with both radical nephrectomy and nephron-sparing surgery, and ablative therapy in the treatment of renal masses. In addition, we discussed our goals of providing an accurate diagnosis and oncologic control while maintaining optimal renal function as appropriate based on the size, location, and complexity of their renal mass as well as their co-morbidities. We have discussed the risks of treatment in detail including but not limited to bleeding, infection, heart attack, stroke, death, venothromoboembolism, cancer recurrence, injury/damage to surrounding organs and structures, urine leak, the possibility of open surgical conversion for patients undergoing minimally invasive surgery, the risk of developing chronic kidney disease and its associated implications, and the potential risk of end stage renal disease possibly necessitating dialysis.    He will be notified of his MRI and CT of the chest results. Laboratory studies will be performed today. He will be tentatively scheduled for a right robot assisted laparoscopic partial nephrectomy although understands the potential need for radical nephrectomy based on the  complexity of his tumor. He also understands the risk of open surgical conversion considering his prior surgical history.    I reviewed his CT scan of the chest and MRI of the abdomen today. This demonstrates some small 2-3 mm pulmonary nodules that are likely benign although malignancy cannot be completely excluded. His renal tumor is indeed enhancing concerning for a primary renal cell carcinoma. He will proceed with the above plan. All questions were answered to his stated satisfaction.     * Signed by Raynelle Bring, M.D. on 09/09/20 at 3:19 PM (EDT)*

## 2020-09-29 ENCOUNTER — Inpatient Hospital Stay (HOSPITAL_COMMUNITY)
Admission: RE | Admit: 2020-09-29 | Discharge: 2020-10-01 | DRG: 657 | Disposition: A | Discharge status: Home / Self Care | Payer: Medicare Other | Attending: Urology | Admitting: Urology

## 2020-09-29 ENCOUNTER — Ambulatory Visit (HOSPITAL_COMMUNITY): Payer: Medicare Other | Admitting: Certified Registered Nurse Anesthetist

## 2020-09-29 ENCOUNTER — Encounter (HOSPITAL_COMMUNITY)
Admission: RE | Disposition: A | Discharge status: Home / Self Care | Payer: Self-pay | Source: Home / Self Care | Attending: Urology

## 2020-09-29 ENCOUNTER — Ambulatory Visit (HOSPITAL_COMMUNITY): Admission: RE | Admit: 2020-09-29 | Payer: Medicare Other | Source: Ambulatory Visit | Admitting: Urology

## 2020-09-29 ENCOUNTER — Encounter (HOSPITAL_COMMUNITY): Payer: Self-pay | Admitting: Urology

## 2020-09-29 DIAGNOSIS — Z8249 Family history of ischemic heart disease and other diseases of the circulatory system: Secondary | ICD-10-CM

## 2020-09-29 DIAGNOSIS — C641 Malignant neoplasm of right kidney, except renal pelvis: Principal | ICD-10-CM | POA: Diagnosis present

## 2020-09-29 DIAGNOSIS — Z79899 Other long term (current) drug therapy: Secondary | ICD-10-CM

## 2020-09-29 DIAGNOSIS — K4090 Unilateral inguinal hernia, without obstruction or gangrene, not specified as recurrent: Secondary | ICD-10-CM | POA: Diagnosis present

## 2020-09-29 DIAGNOSIS — D49511 Neoplasm of unspecified behavior of right kidney: Secondary | ICD-10-CM | POA: Diagnosis present

## 2020-09-29 DIAGNOSIS — K219 Gastro-esophageal reflux disease without esophagitis: Secondary | ICD-10-CM | POA: Diagnosis present

## 2020-09-29 DIAGNOSIS — Z9049 Acquired absence of other specified parts of digestive tract: Secondary | ICD-10-CM

## 2020-09-29 DIAGNOSIS — I10 Essential (primary) hypertension: Secondary | ICD-10-CM | POA: Diagnosis present

## 2020-09-29 DIAGNOSIS — I700 Atherosclerosis of aorta: Secondary | ICD-10-CM | POA: Diagnosis present

## 2020-09-29 DIAGNOSIS — K5730 Diverticulosis of large intestine without perforation or abscess without bleeding: Secondary | ICD-10-CM | POA: Diagnosis present

## 2020-09-29 DIAGNOSIS — N280 Ischemia and infarction of kidney: Secondary | ICD-10-CM | POA: Diagnosis present

## 2020-09-29 HISTORY — PX: ROBOTIC ASSITED PARTIAL NEPHRECTOMY: SHX6087

## 2020-09-29 HISTORY — PX: OPERATIVE ULTRASOUND: SHX5996

## 2020-09-29 LAB — TYPE AND SCREEN
ABO/RH(D): B POS
Antibody Screen: NEGATIVE

## 2020-09-29 LAB — BASIC METABOLIC PANEL
Anion gap: 10 (ref 5–15)
BUN: 14 mg/dL (ref 8–23)
CO2: 25 mmol/L (ref 22–32)
Calcium: 8.5 mg/dL — ABNORMAL LOW (ref 8.9–10.3)
Chloride: 103 mmol/L (ref 98–111)
Creatinine, Ser: 1.2 mg/dL (ref 0.61–1.24)
GFR, Estimated: >60 mL/min (ref >60)
Glucose, Bld: 188 mg/dL — ABNORMAL HIGH (ref 70–99)
Potassium: 3.9 mmol/L (ref 3.5–5.1)
Sodium: 138 mmol/L (ref 135–145)

## 2020-09-29 LAB — HEMOGLOBIN AND HEMATOCRIT, BLOOD
HCT: 46.6 % (ref 39.0–52.0)
Hemoglobin: 16 g/dL (ref 13.0–17.0)

## 2020-09-29 LAB — ABO/RH: ABO/RH(D): B POS

## 2020-09-29 SURGERY — NEPHRECTOMY, PARTIAL, ROBOT-ASSISTED
Anesthesia: General | Laterality: Right

## 2020-09-29 MED ORDER — ROCURONIUM BROMIDE 10 MG/ML (PF) SYRINGE
PREFILLED_SYRINGE | INTRAVENOUS | Status: AC
  Filled 2020-09-29: qty 20

## 2020-09-29 MED ORDER — ACETAMINOPHEN 10 MG/ML IV SOLN: 1000.0000 mg | Freq: Once | INTRAVENOUS | Status: DC | PRN

## 2020-09-29 MED ORDER — CHLORHEXIDINE GLUCONATE 0.12 % MT SOLN
15.0000 mL | Freq: Once | OROMUCOSAL | Status: AC
Start: 2020-09-29 — End: 2020-09-29
  Administered 2020-09-29: 15 mL via OROMUCOSAL

## 2020-09-29 MED ORDER — HYDROMORPHONE HCL 1 MG/ML IJ SOLN
INTRAMUSCULAR | Status: AC
  Administered 2020-09-29: 0.25 mg via INTRAVENOUS
  Filled 2020-09-29: qty 1

## 2020-09-29 MED ORDER — ACETAMINOPHEN 10 MG/ML IV SOLN
INTRAVENOUS | Status: AC
  Administered 2020-09-29: 1000 mg via INTRAVENOUS
  Filled 2020-09-29: qty 100

## 2020-09-29 MED ORDER — LACTATED RINGERS IR SOLN
Status: DC | PRN
Start: 2020-09-29 — End: 2020-09-29
  Administered 2020-09-29: 1000 mL

## 2020-09-29 MED ORDER — TRAMADOL HCL 50 MG PO TABS: 50.0000 mg | ORAL_TABLET | Freq: Four times a day (QID) | ORAL | 0 refills | Status: DC | PRN

## 2020-09-29 MED ORDER — DIPHENHYDRAMINE HCL 12.5 MG/5ML PO ELIX
12.5000 mg | ORAL_SOLUTION | Freq: Four times a day (QID) | ORAL | Status: DC | PRN
  Filled 2020-09-29: qty 10

## 2020-09-29 MED ORDER — DEXTROSE-NACL 5-0.45 % IV SOLN
INTRAVENOUS | Status: DC
  Administered 2020-09-29: 1000 mL via INTRAVENOUS

## 2020-09-29 MED ORDER — FENTANYL CITRATE (PF) 250 MCG/5ML IJ SOLN
INTRAMUSCULAR | Status: DC | PRN
  Administered 2020-09-29 (×2): 50 ug via INTRAVENOUS
  Administered 2020-09-29: 150 ug via INTRAVENOUS

## 2020-09-29 MED ORDER — ROCURONIUM BROMIDE 10 MG/ML (PF) SYRINGE
PREFILLED_SYRINGE | INTRAVENOUS | Status: AC
  Filled 2020-09-29: qty 10

## 2020-09-29 MED ORDER — SODIUM CHLORIDE (PF) 0.9 % IJ SOLN
INTRAMUSCULAR | Status: AC
  Filled 2020-09-29: qty 20

## 2020-09-29 MED ORDER — PROPOFOL 10 MG/ML IV BOLUS
INTRAVENOUS | Status: DC | PRN
  Administered 2020-09-29: 200 mg via INTRAVENOUS

## 2020-09-29 MED ORDER — PANTOPRAZOLE SODIUM 40 MG PO TBEC
80.0000 mg | DELAYED_RELEASE_TABLET | Freq: Every day | ORAL | Status: DC
  Administered 2020-09-30 – 2020-10-01 (×2): 80 mg via ORAL
  Filled 2020-09-29 (×3): qty 2

## 2020-09-29 MED ORDER — SUGAMMADEX SODIUM 200 MG/2ML IV SOLN
INTRAVENOUS | Status: DC | PRN
  Administered 2020-09-29: 500 mg via INTRAVENOUS

## 2020-09-29 MED ORDER — PROMETHAZINE HCL 25 MG/ML IJ SOLN: 6.2500 mg | INTRAMUSCULAR | Status: DC | PRN

## 2020-09-29 MED ORDER — LACTATED RINGERS IV SOLN: INTRAVENOUS | Status: DC

## 2020-09-29 MED ORDER — ORAL CARE MOUTH RINSE: 15.0000 mL | Freq: Once | OROMUCOSAL | Status: AC

## 2020-09-29 MED ORDER — ONDANSETRON HCL 4 MG/2ML IJ SOLN
INTRAMUSCULAR | Status: AC
  Filled 2020-09-29: qty 2

## 2020-09-29 MED ORDER — CEFAZOLIN SODIUM-DEXTROSE 1-4 GM/50ML-% IV SOLN
1.0000 g | Freq: Three times a day (TID) | INTRAVENOUS | Status: AC
  Administered 2020-09-29 (×2): 1 g via INTRAVENOUS
  Filled 2020-09-29: qty 50

## 2020-09-29 MED ORDER — ROCURONIUM BROMIDE 10 MG/ML (PF) SYRINGE
PREFILLED_SYRINGE | INTRAVENOUS | Status: DC | PRN
  Administered 2020-09-29: 30 mg via INTRAVENOUS
  Administered 2020-09-29 (×3): 20 mg via INTRAVENOUS
  Administered 2020-09-29: 70 mg via INTRAVENOUS

## 2020-09-29 MED ORDER — SODIUM CHLORIDE (PF) 0.9 % IJ SOLN
INTRAMUSCULAR | Status: DC | PRN
  Administered 2020-09-29: 20 mL

## 2020-09-29 MED ORDER — MAGNESIUM CITRATE PO SOLN
0.5000 | Freq: Once | ORAL | Status: DC
  Filled 2020-09-29: qty 296

## 2020-09-29 MED ORDER — DEXAMETHASONE SODIUM PHOSPHATE 10 MG/ML IJ SOLN
INTRAMUSCULAR | Status: DC | PRN
  Administered 2020-09-29: 10 mg via INTRAVENOUS

## 2020-09-29 MED ORDER — AMLODIPINE BESYLATE 10 MG PO TABS
10.0000 mg | ORAL_TABLET | Freq: Every day | ORAL | Status: DC
  Administered 2020-09-30 – 2020-10-01 (×2): 10 mg via ORAL
  Filled 2020-09-29 (×3): qty 1

## 2020-09-29 MED ORDER — MIDAZOLAM HCL 2 MG/2ML IJ SOLN
INTRAMUSCULAR | Status: AC
  Filled 2020-09-29: qty 2

## 2020-09-29 MED ORDER — PHENYLEPHRINE HCL-NACL 10-0.9 MG/250ML-% IV SOLN
INTRAVENOUS | Status: DC | PRN
  Administered 2020-09-29: 25 ug/min via INTRAVENOUS

## 2020-09-29 MED ORDER — ONDANSETRON HCL 4 MG/2ML IJ SOLN
INTRAMUSCULAR | Status: DC | PRN
  Administered 2020-09-29: 4 mg via INTRAVENOUS

## 2020-09-29 MED ORDER — PROPOFOL 10 MG/ML IV BOLUS
INTRAVENOUS | Status: AC
  Filled 2020-09-29: qty 20

## 2020-09-29 MED ORDER — MEPERIDINE HCL 50 MG/ML IJ SOLN: 6.2500 mg | INTRAMUSCULAR | Status: DC | PRN

## 2020-09-29 MED ORDER — DIPHENHYDRAMINE HCL 50 MG/ML IJ SOLN: 12.5000 mg | Freq: Four times a day (QID) | INTRAMUSCULAR | Status: DC | PRN

## 2020-09-29 MED ORDER — DOCUSATE SODIUM 100 MG PO CAPS: 100.0000 mg | ORAL_CAPSULE | Freq: Two times a day (BID) | ORAL | Status: DC

## 2020-09-29 MED ORDER — FENTANYL CITRATE (PF) 250 MCG/5ML IJ SOLN
INTRAMUSCULAR | Status: AC
  Filled 2020-09-29: qty 5

## 2020-09-29 MED ORDER — LIDOCAINE 2% (20 MG/ML) 5 ML SYRINGE
INTRAMUSCULAR | Status: DC | PRN
  Administered 2020-09-29: 100 mg via INTRAVENOUS

## 2020-09-29 MED ORDER — LIDOCAINE 2% (20 MG/ML) 5 ML SYRINGE
INTRAMUSCULAR | Status: AC
  Filled 2020-09-29: qty 5

## 2020-09-29 MED ORDER — PHENYLEPHRINE HCL (PRESSORS) 10 MG/ML IV SOLN
INTRAVENOUS | Status: AC
  Filled 2020-09-29: qty 1

## 2020-09-29 MED ORDER — DEXAMETHASONE SODIUM PHOSPHATE 10 MG/ML IJ SOLN
INTRAMUSCULAR | Status: AC
  Filled 2020-09-29: qty 1

## 2020-09-29 MED ORDER — HYDROMORPHONE HCL 1 MG/ML IJ SOLN
0.2500 mg | INTRAMUSCULAR | Status: DC | PRN
  Administered 2020-09-29: 0.5 mg via INTRAVENOUS

## 2020-09-29 MED ORDER — STERILE WATER FOR IRRIGATION IR SOLN
Status: DC | PRN
  Administered 2020-09-29: 1000 mL

## 2020-09-29 MED ORDER — ONDANSETRON HCL 4 MG/2ML IJ SOLN: 4.0000 mg | INTRAMUSCULAR | Status: DC | PRN

## 2020-09-29 MED ORDER — CEFAZOLIN SODIUM-DEXTROSE 1-4 GM/50ML-% IV SOLN
INTRAVENOUS | Status: AC
  Filled 2020-09-29: qty 50

## 2020-09-29 MED ORDER — ACETAMINOPHEN 10 MG/ML IV SOLN
1000.0000 mg | Freq: Four times a day (QID) | INTRAVENOUS | Status: AC
  Administered 2020-09-29 – 2020-09-30 (×3): 1000 mg via INTRAVENOUS
  Filled 2020-09-29 (×3): qty 100

## 2020-09-29 MED ORDER — BUPIVACAINE LIPOSOME 1.3 % IJ SUSP
20.0000 mL | Freq: Once | INTRAMUSCULAR | Status: AC
  Administered 2020-09-29: 20 mL
  Filled 2020-09-29: qty 20

## 2020-09-29 MED ORDER — MIDAZOLAM HCL 5 MG/5ML IJ SOLN
INTRAMUSCULAR | Status: DC | PRN
  Administered 2020-09-29: 2 mg via INTRAVENOUS

## 2020-09-29 MED ORDER — MORPHINE SULFATE (PF) 4 MG/ML IV SOLN: 2.0000 mg | INTRAVENOUS | Status: DC | PRN

## 2020-09-29 MED ORDER — CEFAZOLIN SODIUM-DEXTROSE 2-4 GM/100ML-% IV SOLN
2.0000 g | INTRAVENOUS | Status: AC
  Administered 2020-09-29: 2 g via INTRAVENOUS
  Filled 2020-09-29: qty 100

## 2020-09-29 MED ORDER — DOCUSATE SODIUM 100 MG PO CAPS
100.0000 mg | ORAL_CAPSULE | Freq: Two times a day (BID) | ORAL | Status: DC
  Administered 2020-09-29 – 2020-10-01 (×5): 100 mg via ORAL
  Filled 2020-09-29 (×5): qty 1

## 2020-09-29 SURGICAL SUPPLY — 59 items
ADH SKN CLS APL DERMABOND .7 (GAUZE/BANDAGES/DRESSINGS) ×4
AGENT HMST KT MTR STRL THRMB (HEMOSTASIS) ×2
APL ESCP 34 STRL LF DISP (HEMOSTASIS)
APL PRP STRL LF DISP 70% ISPRP (MISCELLANEOUS) ×2
APPLICATOR SURGIFLO ENDO (HEMOSTASIS) IMPLANT
BAG SPEC RTRVL LRG 6X4 10 (ENDOMECHANICALS) ×2
CHLORAPREP W/TINT 26 (MISCELLANEOUS) ×4 IMPLANT
CLIP VESOLOCK LG 6/CT PURPLE (CLIP) ×4 IMPLANT
CLIP VESOLOCK MED LG 6/CT (CLIP) ×8 IMPLANT
COVER SURGICAL LIGHT HANDLE (MISCELLANEOUS) ×4 IMPLANT
COVER TIP SHEARS 8 DVNC (MISCELLANEOUS) ×2 IMPLANT
COVER TIP SHEARS 8MM DA VINCI (MISCELLANEOUS) ×4
COVER WAND RF STERILE (DRAPES) IMPLANT
DECANTER SPIKE VIAL GLASS SM (MISCELLANEOUS) ×4 IMPLANT
DERMABOND ADVANCED (GAUZE/BANDAGES/DRESSINGS) ×4
DERMABOND ADVANCED .7 DNX12 (GAUZE/BANDAGES/DRESSINGS) ×4 IMPLANT
DRAIN CHANNEL 15F RND FF 3/16 (WOUND CARE) ×4 IMPLANT
DRAPE ARM DVNC X/XI (DISPOSABLE) ×8 IMPLANT
DRAPE COLUMN DVNC XI (DISPOSABLE) ×2 IMPLANT
DRAPE DA VINCI XI ARM (DISPOSABLE) ×16
DRAPE DA VINCI XI COLUMN (DISPOSABLE) ×4
DRAPE INCISE IOBAN 66X45 STRL (DRAPES) ×4 IMPLANT
DRAPE SHEET LG 3/4 BI-LAMINATE (DRAPES) ×4 IMPLANT
DRSG TEGADERM 4X4.75 (GAUZE/BANDAGES/DRESSINGS) ×2 IMPLANT
ELECT PENCIL ROCKER SW 15FT (MISCELLANEOUS) ×4 IMPLANT
ELECT REM PT RETURN 15FT ADLT (MISCELLANEOUS) ×4 IMPLANT
EVACUATOR SILICONE 100CC (DRAIN) ×4 IMPLANT
GAUZE SPONGE 4X4 12PLY STRL (GAUZE/BANDAGES/DRESSINGS) ×2 IMPLANT
GLOVE SURG ENC MOIS LTX SZ6.5 (GLOVE) ×4 IMPLANT
GLOVE SURG ENC TEXT LTX SZ7.5 (GLOVE) ×8 IMPLANT
GOWN STRL REUS W/TWL LRG LVL3 (GOWN DISPOSABLE) ×12 IMPLANT
HEMOSTAT SURGICEL 4X8 (HEMOSTASIS) IMPLANT
HOLDER FOLEY CATH W/STRAP (MISCELLANEOUS) ×4 IMPLANT
IRRIG SUCT STRYKERFLOW 2 WTIP (MISCELLANEOUS) ×4
IRRIGATION SUCT STRKRFLW 2 WTP (MISCELLANEOUS) ×2 IMPLANT
KIT BASIN OR (CUSTOM PROCEDURE TRAY) ×4 IMPLANT
KIT TURNOVER KIT A (KITS) ×4 IMPLANT
POUCH SPECIMEN RETRIEVAL 10MM (ENDOMECHANICALS) ×4 IMPLANT
PROTECTOR NERVE ULNAR (MISCELLANEOUS) ×8 IMPLANT
SEAL CANN UNIV 5-8 DVNC XI (MISCELLANEOUS) ×8 IMPLANT
SEAL XI 5MM-8MM UNIVERSAL (MISCELLANEOUS) ×16
SET TUBE SMOKE EVAC HIGH FLOW (TUBING) ×4 IMPLANT
SOLUTION ELECTROLUBE (MISCELLANEOUS) ×4 IMPLANT
SURGIFLO W/THROMBIN 8M KIT (HEMOSTASIS) ×4 IMPLANT
SUT ETHILON 3 0 PS 1 (SUTURE) ×4 IMPLANT
SUT MNCRL AB 4-0 PS2 18 (SUTURE) ×8 IMPLANT
SUT V-LOC BARB 180 2/0GR6 GS22 (SUTURE) ×4
SUT VIC AB 0 CT1 27 (SUTURE) ×4
SUT VIC AB 0 CT1 27XBRD ANTBC (SUTURE) ×2 IMPLANT
SUT VICRYL 0 UR6 27IN ABS (SUTURE) ×6 IMPLANT
SUT VLOC BARB 180 ABS3/0GR12 (SUTURE) ×4
SUTURE V-LC BRB 180 2/0GR6GS22 (SUTURE) ×2 IMPLANT
SUTURE VLOC BRB 180 ABS3/0GR12 (SUTURE) ×2 IMPLANT
TOWEL OR 17X26 10 PK STRL BLUE (TOWEL DISPOSABLE) ×4 IMPLANT
TOWEL OR NON WOVEN STRL DISP B (DISPOSABLE) ×4 IMPLANT
TRAY FOLEY MTR SLVR 16FR STAT (SET/KITS/TRAYS/PACK) ×4 IMPLANT
TRAY LAPAROSCOPIC (CUSTOM PROCEDURE TRAY) ×4 IMPLANT
TROCAR XCEL 12X100 BLDLESS (ENDOMECHANICALS) ×4 IMPLANT
WATER STERILE IRR 1000ML POUR (IV SOLUTION) ×4 IMPLANT

## 2020-09-29 NOTE — Anesthesia Procedure Notes (Signed)
Procedure Name: Intubation Date/Time: 09/29/2020 7:22 AM Performed by: Mitzie Na, CRNA Pre-anesthesia Checklist: Patient identified, Emergency Drugs available, Suction available and Patient being monitored Patient Re-evaluated:Patient Re-evaluated prior to induction Oxygen Delivery Method: Circle system utilized Preoxygenation: Pre-oxygenation with 100% oxygen Induction Type: IV induction Ventilation: Oral airway inserted - appropriate to patient size and Mask ventilation without difficulty Laryngoscope Size: Mac and 3 Grade View: Grade I Tube type: Oral Tube size: 7.5 mm Number of attempts: 2 Airway Equipment and Method: Stylet and Oral airway Placement Confirmation: ETT inserted through vocal cords under direct vision, positive ETCO2 and breath sounds checked- equal and bilateral Secured at: 24 cm Tube secured with: Tape Dental Injury: Teeth and Oropharynx as per pre-operative assessment  Comments: EMT student robert- attempted x1. No ETCO2, no chest rise. ETT removed and Leeandre Nordling CRNA attempted X1. Positive ETCO2 and chest rise. Bilat breath sounds

## 2020-09-29 NOTE — Transfer of Care (Signed)
Immediate Anesthesia Transfer of Care Note  Patient: Derek Cardenas  Procedure(s) Performed: XI ROBOTIC ASSITED [place] (Right) INTRA OPERATIVE ULTRASOUND  Patient Location: PACU  Anesthesia Type:General  Level of Consciousness: awake, alert , oriented and patient cooperative  Airway & Oxygen Therapy: Patient Spontanous Breathing and Patient connected to face mask oxygen  Post-op Assessment: Report given to RN, Post -op Vital signs reviewed and stable and Patient moving all extremities  Post vital signs: Reviewed and stable  Last Vitals:  Vitals Value Taken Time  BP 136/100 09/29/20 1100  Temp    Pulse 85 09/29/20 1101  Resp 27 09/29/20 1101  SpO2 97 % 09/29/20 1101  Vitals shown include unvalidated device data.  Last Pain:  Vitals:   09/29/20 0535  TempSrc: Oral         Complications: No notable events documented.

## 2020-09-29 NOTE — Anesthesia Postprocedure Evaluation (Signed)
Anesthesia Post Note  Patient: Derek Cardenas  Procedure(s) Performed: XI ROBOTIC ASSITED LAPAROSCOPICPARTIAL NEPHRECTOMY (Right) INTRA OPERATIVE ULTRASOUND     Patient location during evaluation: PACU Anesthesia Type: General Level of consciousness: sedated Pain management: pain level controlled Vital Signs Assessment: post-procedure vital signs reviewed and stable Respiratory status: spontaneous breathing Cardiovascular status: stable Postop Assessment: no apparent nausea or vomiting Anesthetic complications: no   No notable events documented.  Last Vitals:  Vitals:   09/29/20 1215 09/29/20 1230  BP: 121/74 114/80  Pulse: 81 87  Resp: 16 (!) 21  Temp:    SpO2: 94% 95%    Last Pain:  Vitals:   09/29/20 1300  TempSrc:   PainSc: Asleep   Pain Goal:                   Huston Foley

## 2020-09-29 NOTE — Interval H&P Note (Signed)
History and Physical Interval Note:  09/29/2020 6:57 AM  Derek Cardenas  has presented today for surgery, with the diagnosis of RIGHT RENAL NEOPLASM.  The various methods of treatment have been discussed with the patient and family. After consideration of risks, benefits and other options for treatment, the patient has consented to  Procedure(s): XI ROBOTIC ASSITED LAPAROSCOPICPARTIAL NEPHRECTOMY (Right) INTRA OPERATIVE ULTRASOUND (N/A) as a surgical intervention.  The patient's history has been reviewed, patient examined, no change in status, stable for surgery.  I have reviewed the patient's chart and labs.  Questions were answered to the patient's satisfaction.     [employer]

## 2020-09-29 NOTE — Anesthesia Preprocedure Evaluation (Signed)
Anesthesia Evaluation  Patient identified by MRN, date of birth, ID band Patient awake    Reviewed: Allergy & Precautions, NPO status , Patient's Chart, lab work & pertinent test results  History of Anesthesia Complications Negative for: history of anesthetic complications  Airway Mallampati: I       Dental  (+) Dental Advisory Given, Poor Dentition   Pulmonary neg pulmonary ROS,    Pulmonary exam normal        Cardiovascular hypertension, Pt. on medications Normal cardiovascular exam     Neuro/Psych negative neurological ROS  negative psych ROS   GI/Hepatic Neg liver ROS, GERD  Medicated and Controlled,  Endo/Other  negative endocrine ROS  Renal/GU negative Renal ROS  negative genitourinary   Musculoskeletal negative musculoskeletal ROS (+)   Abdominal Normal abdominal exam  (+)   Peds negative pediatric ROS (+)  Hematology negative hematology ROS (+)   Anesthesia Other Findings   Reproductive/Obstetrics negative OB ROS                             Anesthesia Physical  Anesthesia Plan  ASA: 2  Anesthesia Plan: General   Post-op Pain Management:    Induction: Intravenous  PONV Risk Score and Plan: 4 or greater and Ondansetron, Dexamethasone, Scopolamine patch - Pre-op and Midazolam  Airway Management Planned: Oral ETT  Additional Equipment: None  Intra-op Plan:   Post-operative Plan: Extubation in OR  Informed Consent: I have reviewed the patients History and Physical, chart, labs and discussed the procedure including the risks, benefits and alternatives for the proposed anesthesia with the patient or authorized representative who has indicated his/her understanding and acceptance.     Dental advisory given  Plan Discussed with: CRNA  Anesthesia Plan Comments:         Anesthesia Quick Evaluation

## 2020-09-29 NOTE — Op Note (Signed)
Preoperative diagnosis: Right renal neoplasm  Postoperative diagnosis: Right renal neoplasm  Procedure:  Right robotic-assisted laparoscopic partial nephrectomy Intraoperative renal ultrasonography  Surgeon: Pryor Curia. M.D.  Assistant(s): Debbrah Alar, PA-X  An assistant was required for this surgical procedure.  The duties of the assistant included but were not limited to suctioning, passing suture, camera manipulation, retraction. This procedure would not be able to be performed without an Environmental consultant.  Resident: Dr. Ermelinda Das  Anesthesia: General  Complications: None  EBL: 300 mL  IVF:  1300 mL crystalloid  Specimens: Right renal neoplasm  Disposition of specimens: Pathology  Intraoperative findings:       1. Warm renal ischemia time: 15 minutes       2. Intraoperative renal ultrasound findings: There was a solid appearing mass that measured about 4.5 cm on the upper pole of the right kidney posteriorly.  Drains: # 15 Blake perinephric drain  Indication:  Derek Cardenas is a 70 y.o. year old patient with a right renal mass.  After a thorough review of the management options for their renal mass, they elected to proceed with surgical treatment and the above procedure.  We have discussed the potential benefits and risks of the procedure, side effects of the proposed treatment, the likelihood of the patient achieving the goals of the procedure, and any potential problems that might occur during the procedure or recuperation. Informed consent has been obtained.   Description of procedure:  The patient was taken to the operating room and a general anesthetic was administered. The patient was given preoperative antibiotics, placed in the right modified flank position with care to pad all potential pressure points, and prepped and draped in the usual sterile fashion. Next a preoperative timeout was performed.  A site was selected on in the midline for placement of  the assistant port. This was placed using a standard open Hassan technique which allowed entry into the peritoneal cavity under direct vision and without difficulty. A 12 mm port was placed and a pneumoperitoneum established. The camera was then used to inspect the abdomen and there was no evidence of any intra-abdominal injuries or other abnormalities. The remaining abdominal ports were then placed. 8 mm robotic ports were placed in the right upper quadrant, right lower quadrant, and far right lateral abdominal wall. An 8 mm port was placed for the camera site just right of the umbilicus. All ports were placed under direct vision without difficulty. The surgical cart was then docked.   Utilizing the cautery scissors, the white line of Toldt was incised allowing the colon to be mobilized medially and the plane between the mesocolon and the anterior layer of Gerota's fascia to be developed and the kidney to be exposed.  The ureter and gonadal vein were identified inferiorly and the ureter was lifted anteriorly off the psoas muscle.  Dissection proceeded superiorly along the gonadal vein until the renal vein was identified.  The renal hilum was then carefully isolated with a combination of blunt and sharp dissection allowing the renal arterial and venous structures to be separated and isolated in preparation for renal hilar vessel clamping. There was a main renal artery with a smaller upper pole branch noted.     Attention turned to the kidney and the perinephric fat surrounding the renal mass was removed and the kidney was mobilized sufficiently for exposure and resection of the renal mass.   Intraoperative renal ultrasonography was utilized with the laparoscopic ultrasound probe to identify the renal tumor  and identify the tumor margins.   Once the renal mass was properly isolated, preparations were made for resection of the tumor.  Reconstructive sutures were placed into the abdomen for the renorrhaphy  portion of the procedure.  Both renal arteries were then clamped with bulldog clamps.  The tumor was then excised with cold scissor dissection along with an adequate visible gross margin of normal renal parenchyma. The tumor appeared to be excised without any gross violation of the tumor. The renal collecting system was entered during removal of the tumor.  A running 3-0 V-lock suture was then brought through the capsule of the kidney and run along the base of the renal defect to provide hemostasis and close any entry into the renal collecting system if present. Weck clips were used to secure this suture outside the renal capsule at the proximal and distal ends. An additional hemostatic agent (Surgiflo) was then placed into the renal defect. A running 2-0 V lock suture was then used to close the capsule of the kidney using a sliding clip technique which resulted in excellent hemostasis.    The bulldog clamps were then removed from the renal hilar vessel(s).. Total warm renal ischemia time was 15 minutes. The renal tumor resection site was examined. Hemostasis appeared adequate.   The kidney was placed back into its normal anatomic position and covered with perinephric fat as needed.  A # 4 Blake drain was then brought through the lateral lower port site and positioned in the perinephric space.  It was secured to the skin with a nylon suture. The surgical cart was undocked.  The renal tumor specimen was removed intact within an endopouch retrieval bag via the upper midline port site. This incision site was closed at the fascial layer with 0-vicryl suture. All other laparoscopic/robotic ports were removed under direct vision and the pneumoperitoneum let down with inspection of the operative field performed and hemostasis again confirmed. All incision sites were then injected with local anesthetic and reapproximated at the skin level with 4-0 monocryl subcuticular closures.  Dermabond was applied to the skin.  The  patient tolerated the procedure well and without complications.  The patient was able to be extubated and transferred to the recovery unit in satisfactory condition.  Pryor Curia MD

## 2020-09-29 NOTE — Discharge Instructions (Addendum)

## 2020-09-29 NOTE — Progress Notes (Signed)
Patient ID: Derek Cardenas, male   DOB: [birth year], 70 y.o.   MRN: [id number]  Post-op note  Subjective: The patient is doing well.  No complaints.  Pain controlled.  Objective: Vital signs in last 24 hours: Temp:  [97.5 F (36.4 C)-98.6 F (37 C)] 98 F (36.7 C) (06/13 1230) Pulse Rate:  [72-90] 80 (06/13 1530) Resp:  [14-23] 17 (06/13 1530) BP: (114-140)/(74-100) 123/77 (06/13 1530) SpO2:  [93 %-100 %] 97 % (06/13 1530)  Intake/Output from previous day: No intake/output data recorded. Intake/Output this shift: Total I/O In: 1600 [I.V.:1500; IV Piggyback:100] Out: 475 [Urine:50; Drains:125; Blood:300]  Physical Exam:  General: Alert and oriented. Abdomen: Soft, Nondistended. Incisions: Clean and dry. Drain with minimal output.  Lab Results: Recent Labs    09/29/20 1105  HGB 16.0  HCT 46.6    Assessment/Plan: POD#0   1) Continue to monitor, bedrest tonight   Pryor Curia. MD   LOS: 0 days   Dutch Gray 09/29/2020, 4:11 PM

## 2020-09-30 ENCOUNTER — Encounter (HOSPITAL_COMMUNITY): Payer: Self-pay | Admitting: Urology

## 2020-09-30 DIAGNOSIS — D49511 Neoplasm of unspecified behavior of right kidney: Secondary | ICD-10-CM | POA: Diagnosis present

## 2020-09-30 DIAGNOSIS — K219 Gastro-esophageal reflux disease without esophagitis: Secondary | ICD-10-CM | POA: Diagnosis present

## 2020-09-30 DIAGNOSIS — N280 Ischemia and infarction of kidney: Secondary | ICD-10-CM | POA: Diagnosis present

## 2020-09-30 DIAGNOSIS — Z79899 Other long term (current) drug therapy: Secondary | ICD-10-CM | POA: Diagnosis not present

## 2020-09-30 DIAGNOSIS — K4090 Unilateral inguinal hernia, without obstruction or gangrene, not specified as recurrent: Secondary | ICD-10-CM | POA: Diagnosis present

## 2020-09-30 DIAGNOSIS — Z8249 Family history of ischemic heart disease and other diseases of the circulatory system: Secondary | ICD-10-CM | POA: Diagnosis not present

## 2020-09-30 DIAGNOSIS — K5730 Diverticulosis of large intestine without perforation or abscess without bleeding: Secondary | ICD-10-CM | POA: Diagnosis present

## 2020-09-30 DIAGNOSIS — I10 Essential (primary) hypertension: Secondary | ICD-10-CM | POA: Diagnosis present

## 2020-09-30 DIAGNOSIS — Z9049 Acquired absence of other specified parts of digestive tract: Secondary | ICD-10-CM | POA: Diagnosis not present

## 2020-09-30 DIAGNOSIS — I700 Atherosclerosis of aorta: Secondary | ICD-10-CM | POA: Diagnosis present

## 2020-09-30 DIAGNOSIS — C641 Malignant neoplasm of right kidney, except renal pelvis: Secondary | ICD-10-CM | POA: Diagnosis present

## 2020-09-30 LAB — BASIC METABOLIC PANEL
Anion gap: 8 (ref 5–15)
BUN: 14 mg/dL (ref 8–23)
CO2: 23 mmol/L (ref 22–32)
Calcium: 8.6 mg/dL — ABNORMAL LOW (ref 8.9–10.3)
Chloride: 103 mmol/L (ref 98–111)
Creatinine, Ser: 1.25 mg/dL — ABNORMAL HIGH (ref 0.61–1.24)
GFR, Estimated: >60 mL/min (ref >60)
Glucose, Bld: 160 mg/dL — ABNORMAL HIGH (ref 70–99)
Potassium: 4.3 mmol/L (ref 3.5–5.1)
Sodium: 134 mmol/L — ABNORMAL LOW (ref 135–145)

## 2020-09-30 LAB — HEMOGLOBIN AND HEMATOCRIT, BLOOD
HCT: 40.8 % (ref 39.0–52.0)
Hemoglobin: 14 g/dL (ref 13.0–17.0)

## 2020-09-30 LAB — CREATININE, FLUID (PLEURAL, PERITONEAL, JP DRAINAGE): Creat, Fluid: 1.5 mg/dL

## 2020-09-30 MED ORDER — TRAMADOL HCL 50 MG PO TABS
50.0000 mg | ORAL_TABLET | Freq: Four times a day (QID) | ORAL | Status: DC | PRN
  Administered 2020-09-30: 100 mg via ORAL
  Filled 2020-09-30: qty 2

## 2020-09-30 MED ORDER — BISACODYL 10 MG RE SUPP
10.0000 mg | Freq: Once | RECTAL | Status: DC
  Filled 2020-09-30: qty 1

## 2020-09-30 NOTE — Care Management Obs Status (Signed)
[facility] NOTIFICATION   Patient Details  Name: Derek Cardenas MRN: [id number] Date of Birth: 08-Nov-1950   Medicare Observation Status Notification Given:  Yes    MahabirJuliann Pulse, RN 09/30/2020, 12:51 PM

## 2020-09-30 NOTE — Progress Notes (Signed)
Patient ID: Derek Cardenas, male   DOB: 1950/05/30, 70 y.o.   MRN: [id number]  1 Day Post-Op Subjective: Doing well.  No complaints.  No nausea.  No flatus.  Objective: Vital signs in last 24 hours: Temp:  [97.5 F (36.4 C)-98.1 F (36.7 C)] 98.1 F (36.7 C) (06/14 0419) Pulse Rate:  [74-90] 74 (06/14 0419) Resp:  [14-23] 16 (06/14 0419) BP: (109-140)/(72-100) 121/73 (06/14 0419) SpO2:  [93 %-100 %] 96 % (06/14 0419) Weight:  [75.2 kg] 75.2 kg (06/13 1741)  Intake/Output from previous day: 06/13 0701 - 06/14 0700 In: 1787.5 [I.V.:1587.5; IV Piggyback:200] Out: 925 [Urine:400; Drains:225; Blood:300] Intake/Output this shift: Total I/O In: -  Out: 2500 [Urine:2500]  Physical Exam:  General: Alert and oriented CV: RRR Lungs: Clear Abdomen: Soft, ND, drain with minimal output currently Incisions: C/D/I GU: Urine clear Ext: NT, No erythema  Lab Results: Recent Labs    09/29/20 1105 09/30/20 0440  HGB 16.0 14.0  HCT 46.6 40.8   BMET Recent Labs    09/29/20 1105 09/30/20 0440  NA 138 134*  K 3.9 4.3  CL 103 103  CO2 25 23  GLUCOSE 188* 160*  BUN 14 14  CREATININE 1.20 1.25*  CALCIUM 8.5* 8.6*     Studies/Results: No results found.  Assessment/Plan: POD # 1 s/p right RAL partial nephrectomy - Ambulate, IS, DVT prophylaxis - Advance diet - Oral pain medication - Check drain Cr later today - Monitor renal function - Path pending - Anticipate d/c either later today or tomorrow morning   LOS: 0 days   Dutch Gray 09/30/2020, 7:24 AM

## 2020-10-01 LAB — BASIC METABOLIC PANEL
Anion gap: 4 — ABNORMAL LOW (ref 5–15)
BUN: 19 mg/dL (ref 8–23)
CO2: 26 mmol/L (ref 22–32)
Calcium: 8.2 mg/dL — ABNORMAL LOW (ref 8.9–10.3)
Chloride: 105 mmol/L (ref 98–111)
Creatinine, Ser: 1.33 mg/dL — ABNORMAL HIGH (ref 0.61–1.24)
GFR, Estimated: 58 mL/min — ABNORMAL LOW (ref >60)
Glucose, Bld: 112 mg/dL — ABNORMAL HIGH (ref 70–99)
Potassium: 4.1 mmol/L (ref 3.5–5.1)
Sodium: 135 mmol/L (ref 135–145)

## 2020-10-01 LAB — HEMOGLOBIN AND HEMATOCRIT, BLOOD
HCT: 39.4 % (ref 39.0–52.0)
Hemoglobin: 13.2 g/dL (ref 13.0–17.0)

## 2020-10-01 LAB — SURGICAL PATHOLOGY

## 2020-10-01 NOTE — Progress Notes (Signed)
Patient ID: Derek Cardenas, male   DOB: 06-17-1950, 70 y.o.   MRN: [id number]   2 Days Post-Op Subjective: Pt doing well.  No flatus.  Objective: Vital signs in last 24 hours: Temp:  [98.1 F (36.7 C)-98.7 F (37.1 C)] 98.6 F (37 C) (06/15 0439) Pulse Rate:  [74-91] 85 (06/15 0439) Resp:  [18-20] 18 (06/15 0439) BP: (128-143)/(80-83) 139/83 (06/15 0439) SpO2:  [90 %-96 %] 91 % (06/15 0439)  Intake/Output from previous day: 06/14 0701 - 06/15 0700 In: -  Out: 4120 [Urine:3970; Drains:150] Intake/Output this shift: No intake/output data recorded.  Physical Exam:  General: Alert and oriented Abdomen: Soft, ND Incisions: C/D/I Ext: NT, No erythema  Lab Results: Recent Labs    09/29/20 1105 09/30/20 0440 10/01/20 0450  HGB 16.0 14.0 13.2  HCT 46.6 40.8 39.4   BMET Recent Labs    09/30/20 0440 10/01/20 0450  NA 134* 135  K 4.3 4.1  CL 103 105  CO2 23 26  GLUCOSE 160* 112*  BUN 14 19  CREATININE 1.25* 1.33*  CALCIUM 8.6* 8.2*     Studies/Results: No results found.  Assessment/Plan: POD # 2 s/p right RAL partial nephrectomy - D/C drain - D/C home - Path pending   LOS: 1 day   Derek Cardenas 10/01/2020, 7:26 AM

## 2020-10-01 NOTE — Discharge Summary (Signed)
Date of admission: 09/29/2020  Date of discharge: 10/01/2020  Admission diagnosis: Right renal neoplasm  Discharge diagnosis: Right renal neoplasm  Secondary diagnoses: Hypertension, GERD  History and Physical: For full details, please see admission history and physical. Briefly, Derek Cardenas is a 70 y.o. year old patient with a right renal neoplasm.   Hospital Course: He underwent a right RAL partial nephrectomy on 09/29/20.  He tolerated this procedure well and without complications.  He remained hemodynamically stable and began ambulating on POD # 1 and had his diet advanced.  His drain Cr was c/w serum and was removed on POD # 2.  He was able to discharged home.  Laboratory values:  Recent Labs    09/29/20 1105 09/30/20 0440 10/01/20 0450  HGB 16.0 14.0 13.2  HCT 46.6 40.8 39.4   Recent Labs    09/30/20 0440 10/01/20 0450  CREATININE 1.25* 1.33*    Disposition: Home  Discharge instruction: The patient was instructed to be ambulatory but told to refrain from heavy lifting, strenuous activity, or driving.   Discharge medications:  Allergies as of 10/01/2020   No Known Allergies      Medication List     STOP taking these medications    acetaminophen 500 MG tablet Commonly known as: TYLENOL   oxyCODONE 5 MG immediate release tablet Commonly known as: Oxy IR/ROXICODONE   polyethylene glycol 17 g packet Commonly known as: MIRALAX / GLYCOLAX       TAKE these medications    amLODipine 10 MG tablet Commonly known as: NORVASC Take 10 mg by mouth daily.   docusate sodium 100 MG capsule Commonly known as: COLACE Take 1 capsule (100 mg total) by mouth 2 (two) times daily.   omeprazole 40 MG capsule Commonly known as: PRILOSEC Take 40 mg by mouth daily.   traMADol 50 MG tablet Commonly known as: Ultram Take 1-2 tablets (50-100 mg total) by mouth every 6 (six) hours as needed for moderate pain or severe pain.        Followup:   Follow-up Information      Raynelle Bring, MD Follow up on 10/28/2020.   Specialty: Urology Why: at 10:45 Contact information: [address] [place] [address] [phone]

## 2020-10-01 NOTE — Plan of Care (Signed)
  Problem: Education: Goal: Knowledge of the prescribed therapeutic regimen will improve Outcome: Progressing   Problem: Bowel/Gastric: Goal: Gastrointestinal status for postoperative course will improve Outcome: Progressing   Problem: Clinical Measurements: Goal: Postoperative complications will be avoided or minimized Outcome: Progressing   Problem: Respiratory: Goal: Ability to achieve and maintain a regular respiratory rate will improve Outcome: Progressing   Problem: Skin Integrity: Goal: Demonstration of wound healing without infection will improve Outcome: Progressing   Problem: Urinary Elimination: Goal: Ability to avoid or minimize complications of infection will improve Outcome: Progressing Goal: Ability to achieve and maintain urine output will improve Outcome: Progressing   Problem: Education: Goal: Knowledge of General Education information will improve Description: Including pain rating scale, medication(s)/side effects and non-pharmacologic comfort measures Outcome: Progressing   Problem: Health Behavior/Discharge Planning: Goal: Ability to manage health-related needs will improve Outcome: Progressing   Problem: Clinical Measurements: Goal: Ability to maintain clinical measurements within normal limits will improve Outcome: Progressing Goal: Will remain free from infection Outcome: Progressing Goal: Diagnostic test results will improve Outcome: Progressing Goal: Respiratory complications will improve Outcome: Progressing Goal: Cardiovascular complication will be avoided Outcome: Progressing   Problem: Activity: Goal: Risk for activity intolerance will decrease Outcome: Progressing   Problem: Nutrition: Goal: Adequate nutrition will be maintained Outcome: Progressing   Problem: Coping: Goal: Level of anxiety will decrease Outcome: Progressing   Problem: Elimination: Goal: Will not experience complications related to bowel motility Outcome:  Progressing Goal: Will not experience complications related to urinary retention Outcome: Progressing   Problem: Pain Managment: Goal: General experience of comfort will improve Outcome: Progressing   Problem: Safety: Goal: Ability to remain free from injury will improve Outcome: Progressing   Problem: Skin Integrity: Goal: Risk for impaired skin integrity will decrease Outcome: Progressing   

## 2021-02-24 ENCOUNTER — Other Ambulatory Visit: Payer: Self-pay | Admitting: Urology

## 2021-02-24 DIAGNOSIS — R972 Elevated prostate specific antigen [PSA]: Secondary | ICD-10-CM

## 2021-02-24 DIAGNOSIS — N402 Nodular prostate without lower urinary tract symptoms: Secondary | ICD-10-CM

## 2021-03-21 ENCOUNTER — Ambulatory Visit
Admission: RE | Admit: 2021-03-21 | Discharge: 2021-03-21 | Disposition: A | Discharge status: Home / Self Care | Payer: Medicare Other | Source: Ambulatory Visit | Attending: Urology | Admitting: Urology

## 2021-03-21 ENCOUNTER — Other Ambulatory Visit: Payer: Self-pay

## 2021-03-21 DIAGNOSIS — R972 Elevated prostate specific antigen [PSA]: Secondary | ICD-10-CM

## 2021-03-21 DIAGNOSIS — N402 Nodular prostate without lower urinary tract symptoms: Secondary | ICD-10-CM

## 2021-03-21 IMAGING — MR MR PROSTATE WO/W CM
12 series · 48 of 48 positions shown · IV contrast (15 ml multihance)
Comparison: None.

CLINICAL DATA: Elevated PSA.  Prostate nodule.

EXAM:
MR PROSTATE WITHOUT AND WITH CONTRAST
TECHNIQUE: Multiplanar multisequence MRI images were obtained of the pelvis
centered about the prostate. Pre and post contrast images were
obtained.
CONTRAST:  15mL MULTIHANCE GADOBENATE DIMEGLUMINE 529 MG/ML IV SOLN

[Series 3: T2 · coronal · 3.0mm · 0.56mm/px · 1 of 25 slices shown (1 of 3)]
[im 1/25]
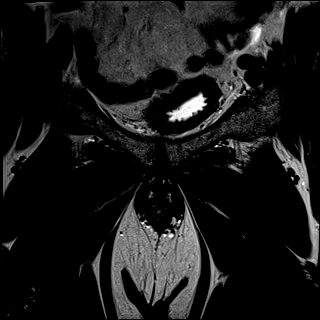

[Series 4: T1 · axial · 5.0mm · 1.25mm/px · z∈[-173,+22]mm · 2 of 80 slices shown]
[im 1/80]
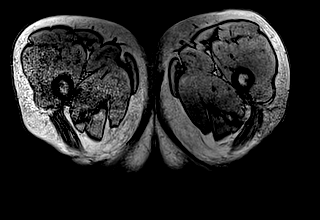
[im 80/80]
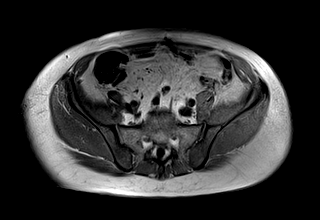

[Series 5: DWI · axial · 3.0mm · 1.75mm/px · z∈[-118,-52]mm · 2 of 69 slices shown (1 of 3)]
[im 1/69]
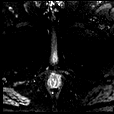
[im 69/69]
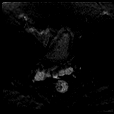

[Series 6: DWI · axial · 3.0mm · 1.75mm/px · 1 of 23 slices shown (2 of 3)]
[im 1/23]
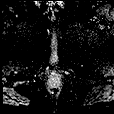

[Series 7: DWI · axial · 3.0mm · 1.75mm/px · 1 of 23 slices shown (3 of 3)]
[im 1/23]
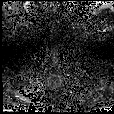

[Series 8: T2 · axial · 3.0mm · 0.56mm/px · 1 of 23 slices shown (2 of 3)]
[im 1/23]
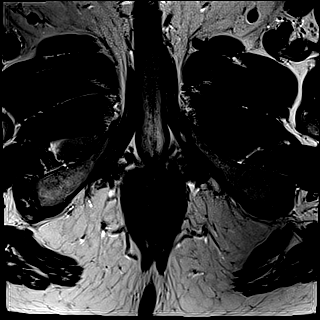

[Series 9: T2 · axial · 1.0mm · 1.04mm/px · z∈[-115,-44]mm · 2 of 72 slices shown (3 of 3)]
[im 1/72]
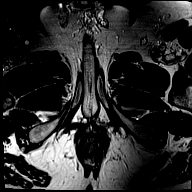
[im 72/72]
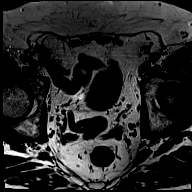

[Series 10: pre t1_twist_tra_dyn · axial · non-contrast · 3.5mm · 0.83mm/px · 1 of 20 slices shown]
[im 1/20]
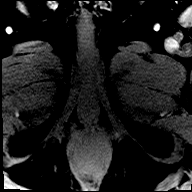

[Series 11: post t1_twist_tra_dyn-copy center · axial · non-contrast · 3.5mm · 0.83mm/px · z∈[-113,-46]mm · 17 of 600 slices shown]
[im 1/600]
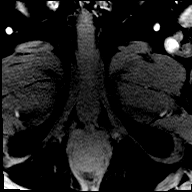
[im 38/600]
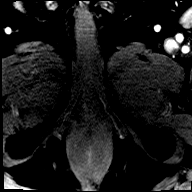
[im 75/600]
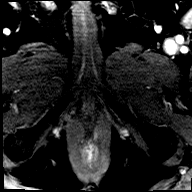
[im 113/600]
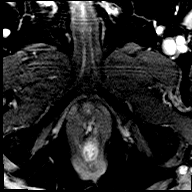
[im 150/600]
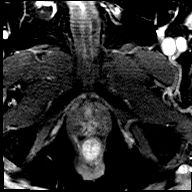
[im 188/600]
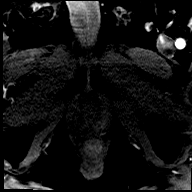
[im 225/600]
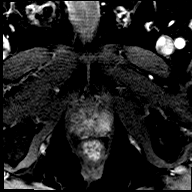
[im 263/600]
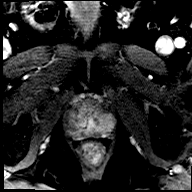
[im 300/600]
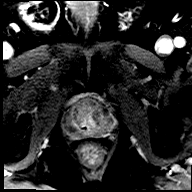
[im 337/600]
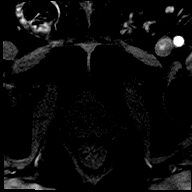
[im 375/600]
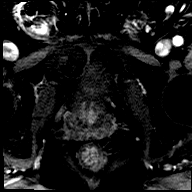
[im 412/600]
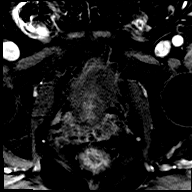
[im 450/600]
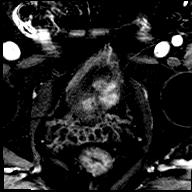
[im 487/600]
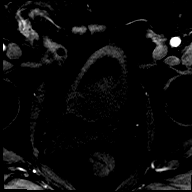
[im 525/600]
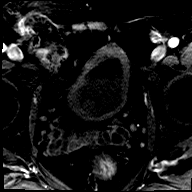
[im 562/600]
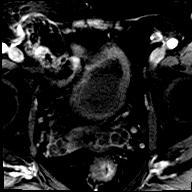
[im 600/600]
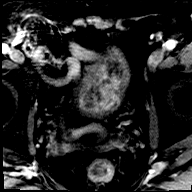

[Series 12: post t1_twist_tra_dyn-copy cent_sub · axial · 3.5mm · 0.83mm/px · z∈[-113,-46]mm · 16 of 554 slices shown]
[im 1/554]
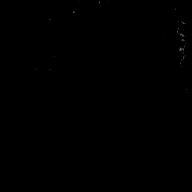
[im 37/554]
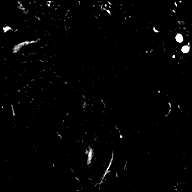
[im 74/554]
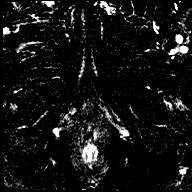
[im 111/554]
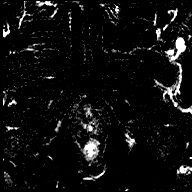
[im 148/554]
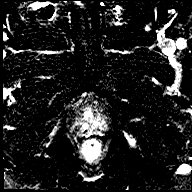
[im 185/554]
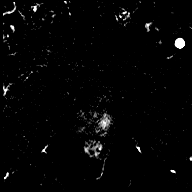
[im 222/554]
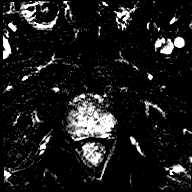
[im 259/554]
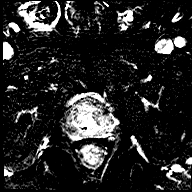
[im 295/554]
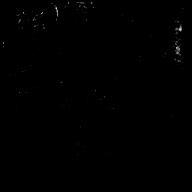
[im 332/554]
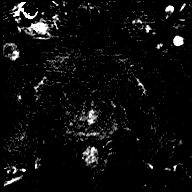
[im 369/554]
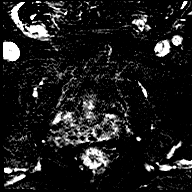
[im 406/554]
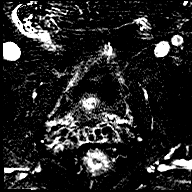
[im 443/554]
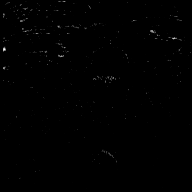
[im 480/554]
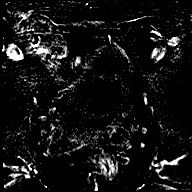
[im 517/554]
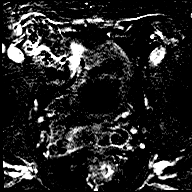
[im 554/554]
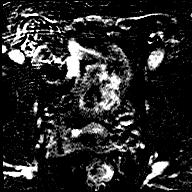

[Series 13: t1_vibe_dixon_tra_f · axial · 2.5mm · 0.91mm/px · z∈[-174,+23]mm · 2 of 80 slices shown]
[im 1/80]
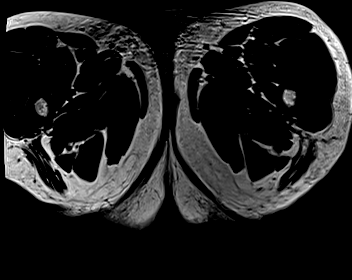
[im 80/80]
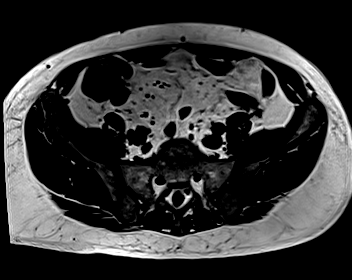

[Series 14: t1_vibe_dixon_tra_w · axial · 2.5mm · 0.91mm/px · z∈[-174,+23]mm · 2 of 80 slices shown]
[im 1/80]
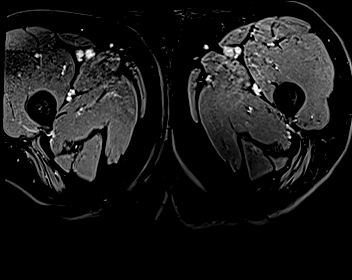
[im 80/80]
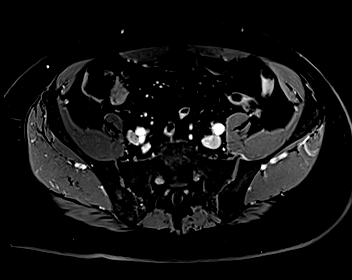

[48 of 48 positions shown; findings below may reference images not displayed]

FINDINGS: Prostate:

-- Peripheral Zone: A poorly defined T2 hypointense nodule is seen
in the left lateral mid gland and apex, which measures 1.8 by 1.0 x
1.4 cm (images 4[REDACTED]). This nodule shows marked ADC
hypointensity, DWI hyperintensity, and early focal contrast
enhancement. PI-RADS 5

A 10 x 7 mm T2 hypointense nodule is seen in the right posteromedial
apex on image 51/9. This nodule shows marked ADC hypointensity, but
no evidence of DWI hyperintensity or early focal contrast
enhancement. PI-RADS 3

-- Transition/Central Zone: Mildly enlarged with small encapsulated
BPH nodules noted; however, no suspicious nodules are identified on
T2-weighted or diffusion sequences.

-- Measurements/Volume:  4.3 by 3.4 x 4.3 cm (volume = 33 cm^3)

Transcapsular spread: Focal capsular bulge and indistinct margin is
seen at the site of the nodule in the left lateral mid gland,
suspicious for mild extracapsular extension.

Seminal vesicle involvement:  Absent

Neurovascular bundle involvement:  Absent

Pelvic adenopathy: None visualized

Bone metastasis: None visualized

Other: Diffuse bladder wall thickening, consistent with chronic
bladder outlet obstruction. Small right inguinal hernia is seen
containing a small bowel loop.
IMPRESSION: 1.8 cm peripheral zone nodule in the left lateral mid gland and
apex, highly suspicious for high-grade carcinoma. Probable mild
extracapsular extension noted. PI-RADS 5 (v2.1): Very high
(clinically significant cancer highly likely)

10 mm peripheral zone nodule in the right posteromedial apex,
indeterminate. PI-RADS 3 (v2.1): Intermediate (clinically
significant cancer equivocal)

No evidence of pelvic metastatic disease.

(I have post-processed this exam in the DynaCAD application for
potential fusion-guided biopsy.)

## 2021-03-21 MED ORDER — GADOBENATE DIMEGLUMINE 529 MG/ML IV SOLN
15.0000 mL | Freq: Once | INTRAVENOUS | Status: AC | PRN
  Administered 2021-03-21: 15 mL via INTRAVENOUS

## 2021-04-07 ENCOUNTER — Other Ambulatory Visit (HOSPITAL_COMMUNITY): Payer: Self-pay | Admitting: Urology

## 2021-04-07 DIAGNOSIS — C61 Malignant neoplasm of prostate: Secondary | ICD-10-CM

## 2021-04-23 ENCOUNTER — Encounter (HOSPITAL_COMMUNITY): Admission: RE | Admit: 2021-04-23 | Payer: Medicare Other | Source: Ambulatory Visit

## 2021-04-27 ENCOUNTER — Other Ambulatory Visit: Payer: Self-pay

## 2021-04-27 ENCOUNTER — Encounter (HOSPITAL_COMMUNITY)
Admission: RE | Admit: 2021-04-27 | Discharge: 2021-04-27 | Disposition: A | Discharge status: Home / Self Care | Payer: Medicare Other | Source: Ambulatory Visit | Attending: Urology | Admitting: Urology

## 2021-04-27 DIAGNOSIS — C61 Malignant neoplasm of prostate: Secondary | ICD-10-CM | POA: Insufficient documentation

## 2021-04-27 IMAGING — CT NM PET TUM IMG SKULL BASE T - THIGH
1 of 7 series · 1 of 25 positions shown · non-contrast
Comparison: Prostate MRI of [DATE]. Chest CT [DATE].
Abdominopelvic CT [DATE].

CLINICAL DATA: Recent diagnosis of prostate cancer. Biopsy 1 month
ago. History of renal cell carcinoma last year.

EXAM:
NUCLEAR MEDICINE PET SKULL BASE TO THIGH
TECHNIQUE: 9.8 mCi F18 Piflufolastat (Pylarify) was injected intravenously.
Full-ring PET imaging was performed from the skull base to thigh
after the radiotracer. CT data was obtained and used for attenuation
correction and anatomic localization.

[Series 3: pet sk_thigh ac · axial · 5.0mm · 4.07mm/px · 1 of 257 slices shown]
[im 154/257]
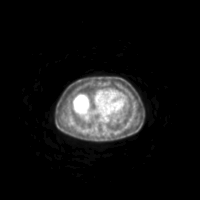

[1 of 25 positions shown; findings below may reference images not displayed]

FINDINGS: NECK

No radiotracer activity in neck lymph nodes.

Incidental CT finding: No cervical adenopathy.

CHEST

No radiotracer accumulation within mediastinal or hilar lymph nodes.
No suspicious pulmonary nodules on the CT scan.

Incidental CT finding: No thoracic adenopathy. Borderline
cardiomegaly.

ABDOMEN/PELVIS

Prostate: Multifocal prostatic tracer uptake. The most significant
is in the lateral left mid to apical gland, including at a S.U.V.
max of 9.7 on 210/4. Right-sided lateral mid gland uptake including
at a S.U.V. max of 4.3.

Lymph nodes: 2 small obturator nodes demonstrate tracer affinity.
Examples at 4 mm and a S.U.V. max of 3.8 on 198/4 and just caudal to
this at 3 mm and a S.U.V. max of 2.9 on 201/4.

A celiac node measures 8 mm and a S.U.V. max of 2.8 on 131/4.
Similar in size on [DATE].

Liver: No evidence of liver metastasis

Incidental CT finding: Mild hepatic steatosis. Retroaortic
low-density structure on 146/4 measures on the order of 8 mm and is
most consistent with a lymphangioma, when correlated with prior MRI.
Upper pole left renal cyst. Partial right nephrectomy. Scattered
colonic diverticula. Surgical changes in the sigmoid. Abdominal
aortic atherosclerosis. A right inguinal hernia contains
nonobstructive ileum.

SKELETON

No focal  activity to suggest skeletal metastasis.
IMPRESSION: 1. Left greater than right tracer avid prostatic primary/primaries.
2. Left obturator nodal metastasis. Isolated tracer avid celiac node
is technically indeterminate, but favored to also represent
metastatic disease.

## 2021-04-27 MED ORDER — PIFLIFOLASTAT F 18 (PYLARIFY) INJECTION
9.0000 | Freq: Once | INTRAVENOUS | Status: AC
  Administered 2021-04-27: 9.8 via INTRAVENOUS

## 2021-05-04 NOTE — Progress Notes (Signed)
I called pt to introduce myself as the Prostate Nurse Navigator and the Coordinator of the [facility].   1. I confirmed with the patient he is aware of his referral to the clinic 1/24, arriving @ 12:30 pm.   2. I discussed the format of the clinic and the physicians he will be seeing that day.   3. I discussed where the clinic is located and how to contact me.   4. I confirmed his address and informed him I would be mailing a packet of information and forms to be completed. I asked him to bring them with him the day of his appointment.    He voiced understanding of the above. I asked him to call me if he has any questions or concerns regarding his appointments or the forms he needs to complete.

## 2021-05-08 ENCOUNTER — Other Ambulatory Visit: Payer: Self-pay | Admitting: Urology

## 2021-05-11 NOTE — Progress Notes (Addendum)
° °                              Care Plan Summary  Name: Derek Cardenas DOB: [birth year]   Your Medical Team:   Urologist -  Dr. Raynelle Bring, [facility]  Radiation Oncologist - Dr. Tyler Pita, [facility]   Medical Oncologist - Dr. Zola Button, [place]  Recommendations: 1) Surgery   * These recommendations are based on information available as of todays consult. Recommendations may change depending on the results of further tests or exams.    Next Steps: 1) Surgery is set up for 06/01/2021 with Dr. Alinda Money.  When appointments need to be scheduled, you will be contacted by [facility] and/or [facility].  Questions?  Please do not hesitate to call Katheren Puller, BSN, RN at [phone] with any questions or concerns.  Kathlee Nations is your Oncology Nurse Navigator and is available to assist you while youre receiving your medical care at [facility].

## 2021-05-12 ENCOUNTER — Ambulatory Visit
Admission: RE | Admit: 2021-05-12 | Discharge: 2021-05-12 | Disposition: A | Discharge status: Home / Self Care | Payer: Medicare Other | Source: Ambulatory Visit | Attending: Radiation Oncology | Admitting: Radiation Oncology

## 2021-05-12 ENCOUNTER — Inpatient Hospital Stay: Payer: Medicare Other | Attending: Oncology | Admitting: Oncology

## 2021-05-12 ENCOUNTER — Other Ambulatory Visit: Payer: Self-pay | Admitting: Genetic Counselor

## 2021-05-12 ENCOUNTER — Other Ambulatory Visit: Payer: Self-pay

## 2021-05-12 ENCOUNTER — Ambulatory Visit (HOSPITAL_BASED_OUTPATIENT_CLINIC_OR_DEPARTMENT_OTHER): Payer: Medicare Other | Admitting: Genetic Counselor

## 2021-05-12 ENCOUNTER — Inpatient Hospital Stay: Payer: Medicare Other

## 2021-05-12 VITALS — BP 127/83 | HR 80 | Temp 96.9°F | Resp 18 | Ht 66.0 in | Wt 173.4 lb

## 2021-05-12 DIAGNOSIS — Z1379 Encounter for other screening for genetic and chromosomal anomalies: Secondary | ICD-10-CM

## 2021-05-12 DIAGNOSIS — C61 Malignant neoplasm of prostate: Secondary | ICD-10-CM | POA: Insufficient documentation

## 2021-05-12 DIAGNOSIS — Z905 Acquired absence of kidney: Secondary | ICD-10-CM | POA: Insufficient documentation

## 2021-05-12 DIAGNOSIS — Z87891 Personal history of nicotine dependence: Secondary | ICD-10-CM | POA: Diagnosis not present

## 2021-05-12 DIAGNOSIS — Z8553 Personal history of malignant neoplasm of renal pelvis: Secondary | ICD-10-CM | POA: Diagnosis not present

## 2021-05-12 LAB — GENETIC SCREENING ORDER

## 2021-05-12 NOTE — Progress Notes (Signed)
Radiation Oncology         [phone] ________________________________  Multidisciplinary Prostate Cancer Clinic  Initial Radiation Oncology Consultation  Name: Derek Cardenas MRN: [id number]  Date: 05/12/2021  DOB: [birth year]  NF:[id number], Baldemar Friday., PA-C  Raynelle Bring, MD   REFERRING PHYSICIAN: Raynelle Bring, MD  DIAGNOSIS: 71 y.o. 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 Malignant neoplasm of prostate ([place])  C61       HISTORY OF PRESENT ILLNESS::Derek Cardenas is a 71 y.o. gentleman. He is an established patient of Dr. 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 He was noted to have an elevated PSA of 5.07 by his primary care provider, Bing Matter, PA-C. A repeat PSA in 01/2021 was further elevated at 6.32. This had increased from a baseline of 0.7 in 12/2017.  Accordingly, he was referred back to Dr. Alinda Money for evaluation on 02/18/21,  digital rectal examination was performed at that time revealing a small right apical prostate nodule. This prompted further evaluation with a prostate MRI which was performed on 03/21/21 showing a 1.8 cm PI-RADS 5 lesion in the peripheral zone of the left lateral mid gland and apex, with probable mild extracapsular extension. Additionally, there was a 10 mm PI-RADS 3 lesion in the peripheral zone of the right posterolateral apex without evidence of pelvic metastatic disease.   PI-RADs 5    PI-RADs 3-4    The patient proceeded to MRI fusion transrectal ultrasound with 20 biopsies of the prostate on 03/31/21.  The prostate volume measured 41.6 cc.  Out of 20 core biopsies, 10 were positive, including all standard left-sided cores.  The maximum Gleason score was 5+4, and this was seen in the left apex lateral (with perineural  invasion). Additionally, Gleason 4+5 was seen in the left mid, left apex lateral, left mid lateral, left base lateral, and all four samples from MRI ROI #1, and a small focus of Gleason 4+4 in the left base.    A PSMA PET scan was performed on 04/27/21 for disease staging and showed left greater than right tracer-avid prostatic primary/primaries with left obturator nodal metastasis and an isolated, tracer-avid celiac node, technically indeterminate and not in a typical location for prostate cancer metastasis.        The patient reviewed the biopsy and imaging results with his urologist and he has kindly been referred today to the multidisciplinary prostate cancer clinic for presentation of pathology and radiology studies in our conference for discussion of potential radiation treatment options and clinical evaluation.  PREVIOUS RADIATION THERAPY: No  PAST MEDICAL HISTORY:  has a past medical history of Diverticulosis, GERD (gastroesophageal reflux disease), Hypertension, and right renal ca (dx'd 07/2020).    PAST SURGICAL HISTORY: Past Surgical History:  Procedure Laterality Date   BOWEL RESECTION     LAPAROSCOPIC APPENDECTOMY N/A 08/06/2020   Procedure: APPENDECTOMY LAPAROSCOPIC;  Surgeon: Stark Klein, MD;  Location: [facility];  Service: General;  Laterality: N/A;   OPERATIVE ULTRASOUND N/A 09/29/2020   Procedure: INTRA OPERATIVE ULTRASOUND;  Surgeon: Raynelle Bring, MD;  Location: [facility];  Service: Urology;  Laterality: N/A;   ROBOTIC ASSITED PARTIAL NEPHRECTOMY Right 09/29/2020   Procedure: XI ROBOTIC ASSITED [place];  Surgeon: Raynelle Bring, MD;  Location: [facility];  Service:  Urology;  Laterality: Right;    FAMILY HISTORY: family history is not on file.  SOCIAL HISTORY:  reports that he has never smoked. He quit smokeless tobacco use about 8 months ago.  His smokeless tobacco use included chew. He reports that he does not currently use alcohol. He reports that he  does not use drugs.  ALLERGIES: Patient has no known allergies.  MEDICATIONS:  Current Outpatient Medications  Medication Sig Dispense Refill   amLODipine (NORVASC) 10 MG tablet Take 10 mg by mouth daily.     docusate sodium (COLACE) 100 MG capsule Take 1 capsule (100 mg total) by mouth 2 (two) times daily.     omeprazole (PRILOSEC) 40 MG capsule Take 40 mg by mouth daily.     traMADol (ULTRAM) 50 MG tablet Take 1-2 tablets (50-100 mg total) by mouth every 6 (six) hours as needed for moderate pain or severe pain. 20 tablet 0   No current facility-administered medications for this encounter.    REVIEW OF SYSTEMS:  On review of systems, the patient reports that he is doing well overall. He denies any chest pain, shortness of breath, cough, fevers, chills, night sweats, unintended weight changes. He denies any bowel disturbances, and denies abdominal pain, nausea or vomiting. He denies any new musculoskeletal or joint aches or pains. His IPSS was 5, indicating mild urinary symptoms. His SHIM was 21, indicating he does not have erectile dysfunction. A complete review of systems is obtained and is otherwise negative.   PHYSICAL EXAM:  Wt Readings from Last 3 Encounters:  05/12/21 173 lb 6 oz (78.6 kg)  09/29/20 165 lb 12.8 oz (75.2 kg)  09/19/20 164 lb (74.4 kg)   Temp Readings from Last 3 Encounters:  05/12/21 (!) 96.9 F (36.1 C) (Temporal)  10/01/20 98.6 F (37 C) (Oral)  09/19/20 98.3 F (36.8 C) (Oral)   BP Readings from Last 3 Encounters:  05/12/21 127/83  10/01/20 139/83  09/19/20 130/73   Pulse Readings from Last 3 Encounters:  05/12/21 80  10/01/20 85  09/19/20 72    /10  In general this is a well appearing caucasian male in no acute distress. He's alert and oriented x4 and appropriate throughout the examination. Cardiopulmonary assessment is negative for acute distress and he exhibits normal effort.    KPS = 100  100 - Normal; no complaints; no evidence of  disease. 90   - Able to carry on normal activity; minor signs or symptoms of disease. 80   - Normal activity with effort; some signs or symptoms of disease. 90   - Cares for self; unable to carry on normal activity or to do active work. 60   - Requires occasional assistance, but is able to care for most of his personal needs. 50   - Requires considerable assistance and frequent medical care. 35   - Disabled; requires special care and assistance. 74   - Severely disabled; hospital admission is indicated although death not imminent. 92   - Very sick; hospital admission necessary; active supportive treatment necessary. 10   - Moribund; fatal processes progressing rapidly. 0     - Dead  Karnofsky DA, Abelmann [place], Craver LS and Burchenal [facility] [phone]) The use of the nitrogen mustards in the palliative treatment of carcinoma: with particular reference to bronchogenic carcinoma Cancer 1 634-56   LABORATORY DATA:  Lab Results  Component Value Date   WBC 8.1 09/19/2020   HGB 13.2 10/01/2020   HCT 39.4 10/01/2020  MCV 81.2 09/19/2020   PLT 218 09/19/2020   Lab Results  Component Value Date   NA 135 10/01/2020   K 4.1 10/01/2020   CL 105 10/01/2020   CO2 26 10/01/2020   Lab Results  Component Value Date   ALT 20 08/06/2020   AST 24 08/06/2020   ALKPHOS 68 08/06/2020   BILITOT 2.1 (H) 08/06/2020     RADIOGRAPHY: NM PET (PSMA) SKULL TO MID THIGH  Result Date: 04/28/2021 CLINICAL DATA:  Recent diagnosis of prostate cancer. Biopsy 1 month ago. History of renal cell carcinoma last year. EXAM: NUCLEAR MEDICINE PET SKULL BASE TO THIGH TECHNIQUE: 9.8 mCi F18 Piflufolastat (Pylarify) was injected intravenously. Full-ring PET imaging was performed from the skull base to thigh after the radiotracer. CT data was obtained and used for attenuation correction and anatomic localization. COMPARISON:  Prostate MRI of 03/23/2021. Chest CT 09/05/2020. Abdominopelvic CT 08/06/2020. FINDINGS: NECK No  radiotracer activity in neck lymph nodes. Incidental CT finding: No cervical adenopathy. CHEST No radiotracer accumulation within mediastinal or hilar lymph nodes. No suspicious pulmonary nodules on the CT scan. Incidental CT finding: No thoracic adenopathy. Borderline cardiomegaly. ABDOMEN/PELVIS Prostate: Multifocal prostatic tracer uptake. The most significant is in the lateral left mid to apical gland, including at a S.U.V. max of 9.7 on 210/4. Right-sided lateral mid gland uptake including at a S.U.V. max of 4.3. Lymph nodes: 2 small obturator nodes demonstrate tracer affinity. Examples at 4 mm and a S.U.V. max of 3.8 on 198/4 and just caudal to this at 3 mm and a S.U.V. max of 2.9 on 201/4. A celiac node measures 8 mm and a S.U.V. max of 2.8 on 131/4. Similar in size on 08/06/2020. Liver: No evidence of liver metastasis Incidental CT finding: Mild hepatic steatosis. Retroaortic low-density structure on 146/4 measures on the order of 8 mm and is most consistent with a lymphangioma, when correlated with prior MRI. Upper pole left renal cyst. Partial right nephrectomy. Scattered colonic diverticula. Surgical changes in the sigmoid. Abdominal aortic atherosclerosis. A right inguinal hernia contains nonobstructive ileum. SKELETON No focal  activity to suggest skeletal metastasis. IMPRESSION: 1. Left greater than right tracer avid prostatic primary/primaries. 2. Left obturator nodal metastasis. Isolated tracer avid celiac node is technically indeterminate, but favored to also represent metastatic disease. Electronically Signed   By: Abigail Miyamoto M.D.   On: 04/28/2021 10:51      IMPRESSION/PLAN: 71 y.o. gentleman with Stage T2a adenocarcinoma of the prostate with a Gleason score of 5+4 and a PSA of 6.32.    We discussed the patient's workup and outlined the nature of prostate cancer in this setting. The patient's T stage, Gleason's score, and PSA put him into the high risk group. Accordingly, he is eligible for a  variety of potential treatment options including prostatectomy or LT-ADT in combination with either 8 weeks of external radiation or 5 weeks of external radiation with an upfront brachytherapy boost. We discussed the available radiation techniques, and focused on the details and logistics of delivery. We discussed and outlined the risks, benefits, short and long-term effects associated with radiotherapy and compared and contrasted these with prostatectomy. We discussed the role of SpaceOAR gel in reducing the rectal toxicity associated with radiotherapy. We also detailed the role of ADT in the treatment of high risk prostate cancer and outlined the associated side effects that could be expected with this therapy.   The patient focused most of his questions and interest in robotic-assisted laparoscopic radical prostatectomy.  We  discussed some of the potential advantages of surgery including surgical staging, the availability of salvage radiotherapy to the prostatic fossa, and the confidence associated with immediate biochemical response.  We discussed some of the potential proven indications for postoperative radiotherapy including positive margins, extracapsular extension, and seminal vesicle involvement. We also talked about some of the other potential findings leading to a recommendation for radiotherapy including a non-zero postoperative PSA and positive lymph nodes. He appears to have a good understanding of his disease and our treatment recommendations which are of curative intent.  He was encouraged to ask questions that were answered to his stated satisfaction.  At the end of the conversation the patient is interested in moving forward with robotic-assisted laparoscopic radical prostatectomy. We will share our discussion with Dr. Alinda Money so that he can move forward with treatment planning accordingly. We enjoyed meeting him and his wife and look forward to continuing to follow his progress.  We  personally spent 60 minutes in this encounter including chart review, reviewing radiological studies, meeting face-to-face with the patient, entering orders and completing documentation.    Nicholos Johns, PA-C    Tyler Pita, MD  [facility] Direct Dial: [phone]   Fax: [phone] [email or web address]   Skype   [email or web address]   This document serves as a record of services personally performed by Tyler Pita, MD and Freeman Caldron, PA-C. It was created on their behalf by Wilburn Mylar, a trained medical scribe. The creation of this record is based on the scribe's personal observations and the provider's statements to them. This document has been checked and approved by the attending provider.

## 2021-05-12 NOTE — Consult Note (Signed)
[facility]     05/12/2021   --------------------------------------------------------------------------------   Derek Cardenas  MRN: [id number]  DOB: Jul 07, 1950, 71 year old Male  SSN:    PRIMARY CARE:  Derek Cardenas, [place]  REFERRING:  Derek Cardenas, Derek Cardenas  PROVIDER:  Raynelle Cardenas, M.D.  LOCATION:  [facility] [phone]     --------------------------------------------------------------------------------   CC/HPI: CC: Prostate Cancer   Location of consult: [place]  PCP: Derek Matter, PA-C   Mr. 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 He was subsequently noted to have a concerning rapid rise of his PSA that increased to 5.02 in September and was 6.32 when rechecked in October 2022. He saw me and was noted to have a small but suspicious right apical nodule and an MRI of the prostate on 03/23/21 indicated a concerning 1.8 cm PI-RAD 5 lesion of the left lateral mid gland with possible EPE and a 1.0 right posterior apical gland that was a PI-RADS 3 lesion. An MR/US fusion biopsy was performed on 03/31/21 that confirmed Gleason 5+4=9 adenocarcinoma with 10 out of 20 biopsy cores positive overall including all left systematic biopsies and all of the biopsies associated with the PI-RADS 5 lesion on the left. No right sided cores were positive including the other MR lesion.   Family history: None.   Imaging studies:  PSMA PET scan: Positive uptake in prostate and two small left obturator lymph nodes with mild uptake that are suspicious measuring 2 mm and 3 mm.  MRI (03/23/21): Possible left EPE. No SVI, LAD, or bone lesions.   PMH: He has a history of GERD, hypertension, and renal cell carcinoma s/p partial nephrectomy in June 2022.  PSH: Right RAL partial nephrectomy, open appendectomy.   TNM stage: cT2a N0 M0  PSA: 6.32  Gleason  score: 5+4=9  Biopsy (03/31/21): 10/20 cores positive  Left: L lateral apex (70%, 4+5=9), L apex (50%, 5+4=9), L lateral mid (80%, 4+5=9), L mid (40%, 4+5=9), L lateral base (20%, 4+5=9), L base (5%, 4+4=8)  Right: Benign  MR targets: ROI-1: 4/4 cores positive, Gleason 4+5=9, 30%, 20%, 20%, 10%, PNI, ROI-2: benign  Prostate volume: 41.6 cc   Nomogram  OC disease: 13%  EPE: 85%  SVI: 31%  LNI: 35%  PFS (5 year, 10 year): 33%, 21%   Urinary function: IPSS is 3.  Erectile function: SHIM score is 22. He does not require medication.     ALLERGIES: No Known Drug Allergies    MEDICATIONS: Diazepam 10 mg tablet Take 10 mg 30-60 minutes prior to your procedure  Levofloxacin 750 mg tablet Please take one tablet the morning of your biopsy.  Omeprazole  Amlodipine Besylate 10 mg tablet     GU PSH: Partial nephrectomy (laparoscopic) - 09/29/2020 Prostate Needle Biopsy - 03/31/2021     NON-GU PSH: Appendectomy (open) - 08/19/2020 Surgical Pathology, Gross And Microscopic Examination For Prostate Needle - 03/31/2021     GU PMH: Prostate Cancer - 04/14/2021 Elevated PSA - 03/31/2021, - 02/18/2021 Prostate nodule w/o LUTS - 03/31/2021, - 02/18/2021 Renal cell carcinoma, right - 10/28/2020 Right renal neoplasm - 08/19/2020      PMH Notes:   1) Renal cell carcinoma: He is s/p a right RAL partial nephrectomy on 09/29/20 for a 5.5 cm renal mass.   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   Tobacco Use Assessment Completed: Used Tobacco in last 30 days? Has never drank.  Does not use drugs. Drinks 1 caffeinated drink per day. Patient's occupation is/was Retired.    REVIEW OF SYSTEMS:    GU Review Male:   Patient denies frequent urination, hard to postpone urination, burning/ pain with urination, get up at night to urinate, leakage of urine, stream starts and stops, trouble starting your streams, and have to strain to urinate .  Gastrointestinal (Lower):   Patient denies diarrhea and constipation.  Gastrointestinal (Upper):   Patient denies nausea and vomiting.  Constitutional:   Patient denies fever, night sweats, weight loss, and fatigue.  Skin:   Patient denies skin rash/ lesion and itching.  Eyes:   Patient denies blurred vision and double vision.  Ears/ Nose/ Throat:   Patient denies sore throat and sinus problems.  Hematologic/Lymphatic:   Patient denies swollen glands and easy bruising.  Cardiovascular:   Patient denies leg swelling and chest pains.  Respiratory:   Patient denies cough and shortness of breath.  Endocrine:   Patient denies excessive thirst.  Musculoskeletal:   Patient denies back pain and joint pain.  Neurological:   Patient denies headaches and dizziness.  Psychologic:   Patient denies depression and anxiety.   VITAL SIGNS: None   MULTI-SYSTEM PHYSICAL EXAMINATION:    Constitutional: Well-nourished. No physical deformities. Normally developed. Good grooming.  Neck: Neck symmetrical, not swollen. Normal tracheal position.  Respiratory: No labored breathing, no use of accessory muscles. Clear bilaterally.  Cardiovascular: Normal temperature, normal extremity pulses, no swelling, no varicosities. Regular rate and rhythm.  Lymphatic: No enlargement of neck, axillae, groin.  Skin: No paleness, no jaundice, no cyanosis. No lesion, no ulcer, no rash.  Neurologic / Psychiatric: Oriented to time, oriented to place,  oriented to person. No depression, no anxiety, no agitation.  Gastrointestinal: No mass, no tenderness, no rigidity, non obese abdomen. He has a well-healed lower midline incision from his prior ruptured appendix. He has his prior well-healed robotic port sites.  Eyes: Normal conjunctivae. Normal eyelids.  Ears, Nose, Mouth, and Throat: Left ear no scars, no lesions, no masses. Right ear no scars, no lesions, no masses. Nose no scars, no lesions, no masses. Normal hearing. Normal lips.  Musculoskeletal: Normal gait and station of head and neck.     Complexity of Data:  Lab Test Review:   PSA  Records Review:   Pathology Reports, Previous Patient Records  X-Ray Review: PET Scan: Reviewed Films.    Notes:                     CLINICAL DATA: Recent diagnosis of prostate cancer. Biopsy 1 month  ago. History of renal cell carcinoma last year.   EXAM:  NUCLEAR MEDICINE PET SKULL BASE TO THIGH   TECHNIQUE:  9.8 mCi F18 Piflufolastat (Pylarify) was injected intravenously.  Full-ring PET imaging was performed from the skull base to thigh  after  the radiotracer. CT data was obtained and used for attenuation  correction and anatomic localization.   COMPARISON: Prostate MRI of 03/23/2021. Chest CT 09/05/2020.  Abdominopelvic CT 08/06/2020.   FINDINGS:  NECK   No radiotracer activity in neck lymph nodes.   Incidental CT finding: No cervical adenopathy.   CHEST   No radiotracer accumulation within mediastinal or hilar lymph nodes.  No suspicious pulmonary nodules on the CT scan.   Incidental CT finding: No thoracic adenopathy. Borderline  cardiomegaly.   ABDOMEN/PELVIS   Prostate: Multifocal prostatic tracer uptake. The most significant  is in the lateral left mid to apical gland, including at a S.U.V.  max of 9.7 on 210/4. Right-sided lateral mid gland uptake including  at a S.U.V. max of 4.3.   Lymph nodes: 2 small obturator nodes demonstrate tracer affinity.  Examples at 4 mm  and a S.U.V. max of 3.8 on 198/4 and just caudal to  this at 3 mm and a S.U.V. max of 2.9 on 201/4.   A celiac node measures 8 mm and a S.U.V. max of 2.8 on 131/4.  Similar in size on 08/06/2020.   Liver: No evidence of liver metastasis   Incidental CT finding: Mild hepatic steatosis. Retroaortic  low-density structure on 146/4 measures on the order of 8 mm and is  most consistent with a lymphangioma, when correlated with prior MRI.  Upper pole left renal cyst. Partial right nephrectomy. Scattered  colonic diverticula. Surgical changes in the sigmoid. Abdominal  aortic atherosclerosis. A right inguinal hernia contains  nonobstructive ileum.   SKELETON   No focal activity to suggest skeletal metastasis.   IMPRESSION:  1. Left greater than right tracer avid prostatic primary/primaries.  2. Left obturator nodal metastasis. Isolated tracer avid celiac node  is technically indeterminate, but favored to also represent  metastatic disease.    Electronically Signed  By: Abigail Miyamoto M.D.  On: 04/28/2021 10:51   PROCEDURES: None   ASSESSMENT:      ICD-10 Details  1 GU:   Prostate Cancer - C61    PLAN:           Document Letter(s):  Created for Patient: Clinical Summary         Notes:   1. Very high risk prostate cancer: I did detailed discussion with Mr. Buchberger and his wife today. He has seen both Dr. Alen Blew and Dr. Tammi Klippel. We reviewed his PSMA PET scan indicating what potentially could be early and limited left obturator lymph node involvement. Despite these findings, we did still recommend aggressive therapy of curative intent and reviewed options including long-term androgen deprivation and radiation therapy versus the option of primary surgical therapy with lymphadenectomy and the potential need for adjuvant/salvage multimodality treatment. Finally, I also discussed the option of the Proteus trial.   The patient was counseled about the natural history of prostate cancer and the  standard treatment options that are available for prostate cancer. It was explained to him how his age and life expectancy, clinical stage, Gleason score/prognostic grade group, and PSA (and PSA density) affect his prognosis, the decision to proceed with additional staging studies, as well as how that information influences recommended treatment strategies. We discussed the roles for active surveillance, radiation therapy, surgical therapy, androgen deprivation, as well as ablative therapy and other investigational options for the treatment of prostate cancer as appropriate to his individual cancer situation. We discussed the risks and benefits of these options with regard to their impact on cancer control and  also in terms of potential adverse events, complications, and impact on quality of life particularly related to urinary and sexual function. The patient was encouraged to ask questions throughout the discussion today and all questions were answered to his stated satisfaction. In addition, the patient was provided with and/or directed to appropriate resources and literature for further education about prostate cancer and treatment options. We discussed surgical therapy for prostate cancer including the different available surgical approaches. We discussed, in detail, the risks and expectations of surgery with regard to cancer control, urinary control, and erectile function as well as the expected postoperative recovery process. Additional risks of surgery including but not limited to bleeding, infection, hernia formation, nerve damage, lymphocele formation, bowel/rectal injury potentially necessitating colostomy, damage to the urinary tract resulting in urine leakage, urethral stricture, and the cardiopulmonary risks such as myocardial infarction, stroke, death, venothromboembolism, etc. were explained. The risk of open surgical conversion for robotic/laparoscopic prostatectomy was also discussed.   After  discussion, he is most interested in proceeding with primary surgical therapy and wishes to avoid systemic androgen deprivation if possible. As such, he is not interested in proceeding with radiation therapy as primary treatment and is not interested in the Proteus surgical trial. Based on his pathology parameters, my plan will be to perform a unilateral right nerve sparing robot-assisted laparoscopic radical prostatectomy and bilateral pelvic lymphadenectomy. We did discuss the potential risk of open surgical conversion considering his prior surgical history.   2. Renal cell carcinoma: I do think that his recent PET/CT is adequate particularly for further evaluation of his questionable small pulmonary nodules that are not seen on his current CT scan. As such, I do not think he requires further surveillance for his renal cell carcinoma at this time. We will likely plan to proceed with repeat imaging this summer when he will be scheduled to follow-up for his ongoing prostate cancer care as well.   CC: Derek Matter, PA-C  Dr. Tyler Pita  Dr. Zola Button        Next Appointment:      Next Appointment: 05/13/2021 03:00 PM    Appointment Type: 52 Physical Therapy    Location: [facility] [phone]    Provider: Nyra Capes    Reason for Visit: Initial Pt - Preoperative PT Prior Radical Prostatectomy

## 2021-05-12 NOTE — Progress Notes (Signed)
Reason for the request:    Prostate cancer  HPI: I was asked by Dr. Alinda Money to evaluate Derek Cardenas for the evaluation of prostate cancer.  He is a 71 year old man with a history of renal cell carcinoma diagnosed in 2022 after having a right kidney lesion.  He underwent a right partial nephrectomy and found to have T1b disease.  He was noted to have an increased PSA up to 6.32 and underwent an MRI in December 2022.  He was found to have a PI-RADS 5 lesion measuring 1.8 cm in the left lateral mid gland.  He underwent a biopsy on December 13 which showed a Gleason score 4+ 5 = in multiple cores.  PSMA PET scan showed uptake in the left prostate with left obturator nodal metastasis potentially involved.  Based on these findings he was presented the prostate cancer multidisciplinary clinic.  He is reporting very little symptoms at this time.  He denies any frequency urgency or hesitancy.  He denies any hematuria or dysuria.  He remains a reasonable health and excellent performance status.  He does not report any headaches, blurry vision, syncope or seizures. Does not report any fevers, chills or sweats.  Does not report any cough, wheezing or hemoptysis.  Does not report any chest pain, palpitation, orthopnea or leg edema.  Does not report any nausea, vomiting or abdominal pain.  Does not report any constipation or diarrhea.  Does not report any skeletal complaints.    Does not report frequency, urgency or hematuria.  Does not report any skin rashes or lesions. Does not report any heat or cold intolerance.  Does not report any lymphadenopathy or petechiae.  Does not report any anxiety or depression.  Remaining review of systems is negative.     Past Medical History:  Diagnosis Date   Diverticulosis    GERD (gastroesophageal reflux disease)    Hypertension    right renal ca dx'd 07/2020  :   Past Surgical History:  Procedure Laterality Date   BOWEL RESECTION     LAPAROSCOPIC APPENDECTOMY N/A 08/06/2020    Procedure: APPENDECTOMY LAPAROSCOPIC;  Surgeon: Stark Klein, MD;  Location: [employer] [facility];  Service: General;  Laterality: N/A;   OPERATIVE ULTRASOUND N/A 09/29/2020   Procedure: INTRA OPERATIVE ULTRASOUND;  Surgeon: Raynelle Bring, MD;  Location: [facility];  Service: Urology;  Laterality: N/A;   ROBOTIC ASSITED PARTIAL NEPHRECTOMY Right 09/29/2020   Procedure: XI ROBOTIC ASSITED [place];  Surgeon: Raynelle Bring, MD;  Location: [facility];  Service: Urology;  Laterality: Right;  :   Current Outpatient Medications:    amLODipine (NORVASC) 10 MG tablet, Take 10 mg by mouth daily., Disp: , Rfl:    docusate sodium (COLACE) 100 MG capsule, Take 1 capsule (100 mg total) by mouth 2 (two) times daily., Disp: , Rfl:    omeprazole (PRILOSEC) 40 MG capsule, Take 40 mg by mouth daily., Disp: , Rfl:    traMADol (ULTRAM) 50 MG tablet, Take 1-2 tablets (50-100 mg total) by mouth every 6 (six) hours as needed for moderate pain or severe pain., Disp: 20 tablet, Rfl: 0:  No Known Allergies:  No family history on file.:   Social History   Socioeconomic History   Marital status: Married    Spouse name: Not on file   Number of children: Not on file   Years of education: Not on file   Highest education level: Not on file  Occupational History   Not on file  Tobacco Use  Smoking status: Never   Smokeless tobacco: Former    Types: Chew    Quit date: 08/19/2020  Vaping Use   Vaping Use: Never used  Substance and Sexual Activity   Alcohol use: Not Currently   Drug use: Never   Sexual activity: Not on file  Other Topics Concern   Not on file  Social History Narrative   Not on file   Social Determinants of Health   Financial Resource Strain: Not on file  Food Insecurity: Not on file  Transportation Needs: Not on file  Physical Activity: Not on file  Stress: Not on file  Social Connections: Not on file  Intimate Partner Violence: Not on file  :  Pertinent items are noted in  HPI.  Exam:  General appearance: alert and cooperative appeared without distress. Head: atraumatic without any abnormalities. Eyes: conjunctivae/corneas clear. PERRL.  Sclera anicteric. Throat: lips, mucosa, and tongue normal; without oral thrush or ulcers. Resp: clear to auscultation bilaterally without rhonchi, wheezes or dullness to percussion. Cardio: regular rate and rhythm, S1, S2 normal, no murmur, click, rub or gallop GI: soft, non-tender; bowel sounds normal; no masses,  no organomegaly Skin: Skin color, texture, turgor normal. No rashes or lesions Lymph nodes: Cervical, supraclavicular, and axillary nodes normal. Neurologic: Grossly normal without any motor, sensory or deep tendon reflexes. Musculoskeletal: No joint deformity or effusion.  NM PET (PSMA) SKULL TO MID THIGH  Result Date: 04/28/2021 CLINICAL DATA:  Recent diagnosis of prostate cancer. Biopsy 1 month ago. History of renal cell carcinoma last year. EXAM: NUCLEAR MEDICINE PET SKULL BASE TO THIGH TECHNIQUE: 9.8 mCi F18 Piflufolastat (Pylarify) was injected intravenously. Full-ring PET imaging was performed from the skull base to thigh after the radiotracer. CT data was obtained and used for attenuation correction and anatomic localization. COMPARISON:  Prostate MRI of 03/23/2021. Chest CT 09/05/2020. Abdominopelvic CT 08/06/2020. FINDINGS: NECK No radiotracer activity in neck lymph nodes. Incidental CT finding: No cervical adenopathy. CHEST No radiotracer accumulation within mediastinal or hilar lymph nodes. No suspicious pulmonary nodules on the CT scan. Incidental CT finding: No thoracic adenopathy. Borderline cardiomegaly. ABDOMEN/PELVIS Prostate: Multifocal prostatic tracer uptake. The most significant is in the lateral left mid to apical gland, including at a S.U.V. max of 9.7 on 210/4. Right-sided lateral mid gland uptake including at a S.U.V. max of 4.3. Lymph nodes: 2 small obturator nodes demonstrate tracer affinity.  Examples at 4 mm and a S.U.V. max of 3.8 on 198/4 and just caudal to this at 3 mm and a S.U.V. max of 2.9 on 201/4. A celiac node measures 8 mm and a S.U.V. max of 2.8 on 131/4. Similar in size on 08/06/2020. Liver: No evidence of liver metastasis Incidental CT finding: Mild hepatic steatosis. Retroaortic low-density structure on 146/4 measures on the order of 8 mm and is most consistent with a lymphangioma, when correlated with prior MRI. Upper pole left renal cyst. Partial right nephrectomy. Scattered colonic diverticula. Surgical changes in the sigmoid. Abdominal aortic atherosclerosis. A right inguinal hernia contains nonobstructive ileum. SKELETON No focal  activity to suggest skeletal metastasis. IMPRESSION: 1. Left greater than right tracer avid prostatic primary/primaries. 2. Left obturator nodal metastasis. Isolated tracer avid celiac node is technically indeterminate, but favored to also represent metastatic disease. Electronically Signed   By: Abigail Miyamoto M.D.   On: 04/28/2021 10:51    Assessment and Plan:   71 year old man with prostate cancer diagnosed December 2022.  He was found to have a Gleason score of 9  and a PSA of 6.3.  He has high risk disease with PSMA PET scan that shows localized disease and possible area of metastasis in obturator lymph node.  This case was discussed today in the prostate cancer multidisciplinary clinic including review of his pathology results with the reviewing pathologist and reviewing imaging studies with radiology.  Treatment choices including primary surgical therapy which could be part of a try modality approach versus radiation and androgen deprivation therapy were reiterated.  Therapy escalation with androgen receptor pathway inhibitors could also be considered in the setting.  After discussion today, he is leaning towards primary surgical therapy but he understands he might require salvage option subsequently.  We also discussed the role of additional  systemic therapy including chemotherapy, oral targeted therapy among others which will be deferred at this time.  All his questions were answered to his satisfaction.  30  minutes were dedicated to this visit. The time was spent on reviewing laboratory data, imaging studies, discussing treatment options, and answering questions regarding future plan.    A copy of this consult has been forwarded to the requesting physician.

## 2021-05-12 NOTE — Progress Notes (Incomplete)
° ° °  PI-RADs 5    PI-RADs 4

## 2021-05-13 ENCOUNTER — Encounter: Payer: Self-pay | Admitting: Genetic Counselor

## 2021-05-13 NOTE — Progress Notes (Signed)
REFERRING PROVIDER: Zola Button, MD [address] [place], [place] [address]  PRIMARY PROVIDER:  Aletha Halim., PA-C  PRIMARY REASON FOR VISIT:  1. Malignant neoplasm of prostate ([place])    HISTORY OF PRESENT ILLNESS:   Derek Cardenas, a 71 y.o. male, was seen for a [place] cancer genetics consultation at the request of Dr. Alen Blew due to a personal history of cancer.  Derek Cardenas presents to clinic today to discuss the possibility of a hereditary predisposition to cancer, to discuss genetic testing, and to further clarify his future cancer risks, as well as potential cancer risks for family members.   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  CANCER HISTORY:  Oncology History  Malignant neoplasm of prostate ([place])  03/31/2021 Cancer Staging   Staging form: Prostate, AJCC 8th Edition - Clinical stage from 03/31/2021: Stage IVA (cT2a, cN1, cM0, PSA: 6.3, Grade Group: 5) - Signed by Freeman Caldron, PA-C on 05/12/2021 Histopathologic type: Adenocarcinoma, NOS Prostate specific antigen (PSA) range: Less than 10 Gleason primary pattern: 5 Gleason secondary pattern: 4 Gleason score: 9 Histologic grading system: 5 grade system Number of biopsy cores examined: 20 Number of biopsy cores positive: 10 Location of positive needle core biopsies: One side    05/12/2021 Initial Diagnosis   Malignant neoplasm of prostate [facility])      Past Medical History:  Diagnosis Date   Diverticulosis    GERD (gastroesophageal reflux disease)    Hypertension    right renal ca dx'd 07/2020    Past Surgical History:  Procedure Laterality Date   BOWEL RESECTION     LAPAROSCOPIC APPENDECTOMY N/A 08/06/2020   Procedure: APPENDECTOMY LAPAROSCOPIC;  Surgeon: Stark Klein, MD;  Location: [facility];  Service: General;  Laterality: N/A;   OPERATIVE ULTRASOUND N/A  09/29/2020   Procedure: INTRA OPERATIVE ULTRASOUND;  Surgeon: Raynelle Bring, MD;  Location: [facility];  Service: Urology;  Laterality: N/A;   ROBOTIC ASSITED PARTIAL NEPHRECTOMY Right 09/29/2020   Procedure: XI ROBOTIC ASSITED [place];  Surgeon: Raynelle Bring, MD;  Location: [facility];  Service: Urology;  Laterality: Right;    Social History   Socioeconomic History   Marital status: Married    Spouse name: Not on file   Number of children: Not on file   Years of education: Not on file   Highest education level: Not on file  Occupational History   Not on file  Tobacco Use   Smoking status: Never   Smokeless tobacco: Former    Types: Chew    Quit date: 08/19/2020  Vaping Use   Vaping Use: Never used  Substance and Sexual Activity   Alcohol use: Not Currently   Drug use: Never   Sexual activity: Not on file  Other Topics Concern   Not on file  Social History Narrative   Not on file   Social Determinants of Health   Financial Resource Strain: Not on file  Food Insecurity: Not on file  Transportation Needs: Not on file  Physical Activity: Not on file  Stress: Not on file  Social Connections: Not on file     FAMILY HISTORY:  We obtained a detailed, 4-generation family history.  Significant diagnoses are listed below: Family History  Problem Relation Age of Onset   Melanoma Mother      Derek Cardenas reports his mother  had a history of melanoma and died at age 56. Derek Cardenas is unaware of previous family history of genetic testing for hereditary cancer risks.   GENETIC COUNSELING ASSESSMENT: Derek Cardenas is a 71 y.o. male with a personal and family history of cancer which is somewhat suggestive of a hereditary predisposition to cancer given his diagnosis of high risk prostate cancer. We, therefore, discussed and recommended the following at today's visit.   DISCUSSION: We discussed that 5 - 10% of cancer is hereditary, with most cases of prostate cancer associated  with BRCA1/2 and HOXB13.  There are other genes that can be associated with hereditary prostate cancer syndromes.  We discussed that testing is beneficial for several reasons including knowing how to follow individuals after completing their treatment, identifying whether potential treatment options would be beneficial, and understanding if other family members could be at risk for cancer and allowing them to undergo genetic testing.   We reviewed the characteristics, features and inheritance patterns of hereditary cancer syndromes. We also discussed genetic testing, including the appropriate family members to test, the process of testing, insurance coverage and turn-around-time for results. We discussed the implications of a negative, positive, carrier and/or variant of uncertain significant result. We recommended Derek Cardenas pursue genetic testing for a panel that includes genes associated with prostate cancer.   Derek Cardenas was offered a common hereditary cancer panel (36 genes)+renal cancer genes and an expanded pan-cancer panel (84 genes). Derek Cardenas was informed of the benefits and limitations of each panel, including that expanded pan-cancer panels contain genes that do not have clear management guidelines at this point in time.  We also discussed that as the number of genes included on a panel increases, the chances of variants of uncertain significance increases.  After considering the benefits and limitations of each gene panel, Derek Cardenas  elected to have [facility] CancerNext Panel+ BAP1, FH, FLCN, MET, MITF (41 genes).  The CancerNext gene panel offered by [employer] includes sequencing, rearrangement analysis, and RNA analysis for the following 36 genes:   APC, ATM, AXIN2, BARD1, BMPR1A, BRCA1, BRCA2, BRIP1, CDH1, CDK4, CDKN2A, CHEK2, DICER1, HOXB13, EPCAM, GREM1, MLH1, MSH2, MSH3, MSH6, MUTYH, NBN, NF1, NTHL1, PALB2, PMS2, POLD1, POLE, PTEN, RAD51C, RAD51D, RECQL, SMAD4, SMARCA4, STK11, and TP53.  We have  also added the following 5 genes: BAP1, FH, FLCN, MET, MITF.  Based on Derek Cardenas personal and family history of cancer, he meets medical criteria for genetic testing. Derek Cardenas has Medicare Part A/B as his insurance, therefore, his testing will be of no charge.  PLAN: After considering the risks, benefits, and limitations, Derek Cardenas provided informed consent to pursue genetic testing and the blood sample was sent to [facility] for analysis of the CustomNext Panel+RNA. Results should be available within approximately 2-3 weeks' time, at which point they will be disclosed by telephone to Derek Cardenas, as will any additional recommendations warranted by these results. Derek Cardenas will receive a summary of his genetic counseling visit and a copy of his results once available. This information will also be available in Epic.   Derek Cardenas questions were answered to his satisfaction today. Our contact information was provided should additional questions or concerns arise. Thank you for the referral and allowing [place] to share in the care of your patient.   [name], MS, [facility] Genetic Counselor [email or web address] (P) [phone]  The patient was seen for a total of 20 minutes in face-to-face genetic counseling. The patient brought his wife. Dr.  Alen Blew was available to discuss this case as needed.   _______________________________________________________________________ For Office Staff:  Number of people involved in session: 2 Was an Intern/ student involved with case: no

## 2021-05-18 ENCOUNTER — Encounter: Payer: Self-pay | Admitting: General Practice

## 2021-05-18 NOTE — Progress Notes (Signed)
[facility] Psychosocial Distress Screening Spiritual Care  Met with Derek Cardenas") by phone following Prostate Multidisciplinary Clinic to introduce [place] team/resources, reviewing distress screen per protocol.  The patient scored a  0  on the Psychosocial Distress Thermometer which indicates  [minimal]  distress. Also assessed for distress and other psychosocial needs.   ONCBCN DISTRESS SCREENING 05/18/2021  Screening Type Initial Screening  Referral to support programs Yes   Derek Cardenas reports very little distress and high comfort with/confidence in his team. He knows to reach out if needs arise or circumstances change.  Follow up needed: No.   Chaplain Lorrin Jackson, [place], [facility] Pager [phone] Voicemail [phone]

## 2021-05-19 ENCOUNTER — Ambulatory Visit: Payer: Medicare Other | Admitting: Radiation Oncology

## 2021-05-19 ENCOUNTER — Ambulatory Visit: Payer: Medicare Other

## 2021-05-21 NOTE — Progress Notes (Signed)
Covid test on 05/28/21 at 0830am .  Come thru main entrance at [facility] long.  Have a seat in the lobby on the right as you come thru the door.  Call [phone] and let them know you are here for covid testing .                Derek Cardenas  05/21/2021   Your procedure is scheduled on:       06/01/21   Report to [facility] Main  Entrance   Report to admitting at   [phone]     Call this number if you have problems the morning of surgery [phone]    Remember: Do not eat food , candy gum or mints :After Midnight. You may have clear liquids from midnight until __  0830am  Clear liquid diet the day before surgery.   Mix 17 g of Miralax in 4 ounces of water and drink at 12 noon day before surgery.   Fleets enema nite before surgery.   CLEAR LIQUID DIET   Foods Allowed                                                                       Coffee and tea, regular and decaf                              Plain Jell-O any favor except red or purple                                            Fruit ices (not with fruit pulp)                                      Iced Popsicles                                     Carbonated beverages, regular and diet                                    Cranberry, grape and apple juices Sports drinks like Gatorade Lightly seasoned clear broth or consume(fat free) Sugar   _____________________________________________________________________    BRUSH YOUR TEETH MORNING OF SURGERY AND RINSE YOUR MOUTH OUT, NO CHEWING GUM CANDY OR MINTS.     Take these medicines the morning of surgery with A SIP OF WATER:  amlodiopine, omeprazole   DO NOT TAKE ANY DIABETIC MEDICATIONS DAY OF YOUR SURGERY                               You may not have any metal on your body including hair pins and              piercings  Do not wear jewelry, make-up, lotions, powders or perfumes, deodorant  Do not wear nail polish on your fingernails.  Do not shave  48 hours  prior to surgery.              Men may shave face and neck.   Do not bring valuables to the hospital. [place].  Contacts, dentures or bridgework may not be worn into surgery.  Leave suitcase in the car. After surgery it may be brought to your room.     Patients discharged the day of surgery will not be allowed to drive home. IF YOU ARE HAVING SURGERY AND GOING HOME THE SAME DAY, YOU MUST HAVE AN ADULT TO DRIVE YOU HOME AND BE WITH YOU FOR 24 HOURS. YOU MAY GO HOME BY TAXI OR UBER OR ORTHERWISE, BUT AN ADULT MUST ACCOMPANY YOU HOME AND STAY WITH YOU FOR 24 HOURS.  Name and phone number of your driver:  Special Instructions: N/A              Please read over the following fact sheets you were given: _____________________________________________________________________  [facility] - Preparing for Surgery Before surgery, you can play an important role.  Because skin is not sterile, your skin needs to be as free of germs as possible.  You can reduce the number of germs on your skin by washing with CHG (chlorahexidine gluconate) soap before surgery.  CHG is an antiseptic cleaner which kills germs and bonds with the skin to continue killing germs even after washing. Please DO NOT use if you have an allergy to CHG or antibacterial soaps.  If your skin becomes reddened/irritated stop using the CHG and inform your nurse when you arrive at Short Stay. Do not shave (including legs and underarms) for at least 48 hours prior to the first CHG shower.  You may shave your face/neck. Please follow these instructions carefully:  1.  Shower with CHG Soap the night before surgery and the  morning of Surgery.  2.  If you choose to wash your hair, wash your hair first as usual with your  normal  shampoo.  3.  After you shampoo, rinse your hair and body thoroughly to remove the  shampoo.                           4.  Use CHG as you would any other liquid soap.  You  can apply chg directly  to the skin and wash                       Gently with a scrungie or clean washcloth.  5.  Apply the CHG Soap to your body ONLY FROM THE NECK DOWN.   Do not use on face/ open                           Wound or open sores. Avoid contact with eyes, ears mouth and genitals (private parts).                       Wash face,  Genitals (private parts) with your normal soap.             6.  Wash thoroughly, paying special attention to the area where your surgery  will be performed.  7.  Thoroughly rinse your body  with warm water from the neck down.  8.  DO NOT shower/wash with your normal soap after using and rinsing off  the CHG Soap.                9.  Pat yourself dry with a clean towel.            10.  Wear clean pajamas.            11.  Place clean sheets on your bed the night of your first shower and do not  sleep with pets. Day of Surgery : Do not apply any lotions/deodorants the morning of surgery.  Please wear clean clothes to the hospital/surgery center.  FAILURE TO FOLLOW THESE INSTRUCTIONS MAY RESULT IN THE CANCELLATION OF YOUR SURGERY PATIENT SIGNATURE_________________________________  NURSE SIGNATURE__________________________________  ________________________________________________________________________

## 2021-05-21 NOTE — Progress Notes (Addendum)
Anesthesia Review:  PCP: DR Emmie Niemann at [facility] with [facility]  [place] 01/05/21  Cardiologist : none  Chest x-ray : EKG :05/25/21  Echo : Stress test: Cardiac Cath :  Activity level: can do a flight of stairs without difficulty  Sleep Study/ CPAP : none  Fasting Blood Sugar :      / Checks Blood Sugar -- times a day:   Blood Thinner/ Instructions /Last Dose: ASA / Instructions/ Last Dose :   COVID TEST ON 05/28/21 AT 0122UI

## 2021-05-25 ENCOUNTER — Other Ambulatory Visit: Payer: Self-pay

## 2021-05-25 ENCOUNTER — Encounter (HOSPITAL_COMMUNITY)
Admission: RE | Admit: 2021-05-25 | Discharge: 2021-05-25 | Disposition: A | Discharge status: Home / Self Care | Payer: Medicare Other | Source: Ambulatory Visit | Attending: Urology | Admitting: Urology

## 2021-05-25 ENCOUNTER — Encounter (HOSPITAL_COMMUNITY): Payer: Self-pay

## 2021-05-25 VITALS — BP 154/81 | HR 76 | Temp 98.3°F | Resp 16 | Ht 66.0 in | Wt 169.0 lb

## 2021-05-25 DIAGNOSIS — Z01818 Encounter for other preprocedural examination: Secondary | ICD-10-CM | POA: Diagnosis present

## 2021-05-25 LAB — BASIC METABOLIC PANEL
Anion gap: 7 (ref 5–15)
BUN: 16 mg/dL (ref 8–23)
CO2: 27 mmol/L (ref 22–32)
Calcium: 9.2 mg/dL (ref 8.9–10.3)
Chloride: 104 mmol/L (ref 98–111)
Creatinine, Ser: 1.06 mg/dL (ref 0.61–1.24)
GFR, Estimated: >60 mL/min (ref >60)
Glucose, Bld: 109 mg/dL — ABNORMAL HIGH (ref 70–99)
Potassium: 4.4 mmol/L (ref 3.5–5.1)
Sodium: 138 mmol/L (ref 135–145)

## 2021-05-25 LAB — CBC
HCT: 48.4 % (ref 39.0–52.0)
Hemoglobin: 16.4 g/dL (ref 13.0–17.0)
MCH: 27.2 pg (ref 26.0–34.0)
MCHC: 33.9 g/dL (ref 30.0–36.0)
MCV: 80.3 fL (ref 80.0–100.0)
Platelets: 195 10*3/uL (ref 150–400)
RBC: 6.03 MIL/uL — ABNORMAL HIGH (ref 4.22–5.81)
RDW: 13.2 % (ref 11.5–15.5)
WBC: 8.2 10*3/uL (ref 4.0–10.5)
nRBC: 0 % (ref 0.0–0.2)

## 2021-05-28 ENCOUNTER — Other Ambulatory Visit: Payer: Self-pay

## 2021-05-28 ENCOUNTER — Encounter (HOSPITAL_COMMUNITY)
Admission: RE | Admit: 2021-05-28 | Discharge: 2021-05-28 | Disposition: A | Discharge status: Home / Self Care | Payer: Medicare Other | Source: Ambulatory Visit | Attending: Urology | Admitting: Urology

## 2021-05-28 DIAGNOSIS — Z01812 Encounter for preprocedural laboratory examination: Secondary | ICD-10-CM | POA: Insufficient documentation

## 2021-05-28 DIAGNOSIS — Z20822 Contact with and (suspected) exposure to covid-19: Secondary | ICD-10-CM | POA: Insufficient documentation

## 2021-05-28 DIAGNOSIS — Z01818 Encounter for other preprocedural examination: Secondary | ICD-10-CM

## 2021-05-28 LAB — SARS CORONAVIRUS 2 (TAT 6-24 HRS): SARS Coronavirus 2: NEGATIVE

## 2021-05-29 NOTE — H&P (Signed)
[facility]     05/12/2021   --------------------------------------------------------------------------------   Loretha Brasil. Wageman  MRN: [id number]  DOB: Jul 07, 1950, 71 year old Male  SSN:    PRIMARY CARE:  Bing Matter, [place]  REFERRING:  Raynelle Bring, Eduardo Osier  PROVIDER:  Raynelle Bring, M.D.  LOCATION:  [facility] [phone]     --------------------------------------------------------------------------------   CC/HPI: CC: Prostate Cancer   Location of consult: [place]  PCP: Bing Matter, PA-C   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 He was subsequently noted to have a concerning rapid rise of his PSA that increased to 5.02 in September and was 6.32 when rechecked in October 2022. He saw me and was noted to have a small but suspicious right apical nodule and an MRI of the prostate on 03/23/21 indicated a concerning 1.8 cm PI-RAD 5 lesion of the left lateral mid gland with possible EPE and a 1.0 right posterior apical gland that was a PI-RADS 3 lesion. An MR/US fusion biopsy was performed on 03/31/21 that confirmed Gleason 5+4=9 adenocarcinoma with 10 out of 20 biopsy cores positive overall including all left systematic biopsies and all of the biopsies associated with the PI-RADS 5 lesion on the left. No right sided cores were positive including the other MR lesion.   Family history: None.   Imaging studies:  PSMA PET scan: Positive uptake in prostate and two small left obturator lymph nodes with mild uptake that are suspicious measuring 2 mm and 3 mm.  MRI (03/23/21): Possible left EPE. No SVI, LAD, or bone lesions.   PMH: He has a history of GERD, hypertension, and renal cell carcinoma s/p partial nephrectomy in June 2022.  PSH: Right RAL partial nephrectomy, open appendectomy.   TNM stage: cT2a N0 M0  PSA: 6.32  Gleason  score: 5+4=9  Biopsy (03/31/21): 10/20 cores positive  Left: L lateral apex (70%, 4+5=9), L apex (50%, 5+4=9), L lateral mid (80%, 4+5=9), L mid (40%, 4+5=9), L lateral base (20%, 4+5=9), L base (5%, 4+4=8)  Right: Benign  MR targets: ROI-1: 4/4 cores positive, Gleason 4+5=9, 30%, 20%, 20%, 10%, PNI, ROI-2: benign  Prostate volume: 41.6 cc   Nomogram  OC disease: 13%  EPE: 85%  SVI: 31%  LNI: 35%  PFS (5 year, 10 year): 33%, 21%   Urinary function: IPSS is 3.  Erectile function: SHIM score is 22. He does not require medication.     ALLERGIES: No Known Drug Allergies    MEDICATIONS: Diazepam 10 mg tablet Take 10 mg 30-60 minutes prior to your procedure  Levofloxacin 750 mg tablet Please take one tablet the morning of your biopsy.  Omeprazole  Amlodipine Besylate 10 mg tablet     GU PSH: Partial nephrectomy (laparoscopic) - 09/29/2020 Prostate Needle Biopsy - 03/31/2021     NON-GU PSH: Appendectomy (open) - 08/19/2020 Surgical Pathology, Gross And Microscopic Examination For Prostate Needle - 03/31/2021     GU PMH: Prostate Cancer - 04/14/2021 Elevated PSA - 03/31/2021, - 02/18/2021 Prostate nodule w/o LUTS - 03/31/2021, - 02/18/2021 Renal cell carcinoma, right - 10/28/2020 Right renal neoplasm - 08/19/2020      PMH Notes:   1) Renal cell carcinoma: He is s/p a right RAL partial nephrectomy on 09/29/20 for a 5.5 cm renal mass.   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   Tobacco Use Assessment Completed: Used Tobacco in last 30 days? Has never drank.  Does not use drugs. Drinks 1 caffeinated drink per day. Patient's occupation is/was Retired.    REVIEW OF SYSTEMS:    GU Review Male:   Patient denies frequent urination, hard to postpone urination, burning/ pain with urination, get up at night to urinate, leakage of urine, stream starts and stops, trouble starting your streams, and have to strain to urinate .  Gastrointestinal (Lower):   Patient denies diarrhea and constipation.  Gastrointestinal (Upper):   Patient denies nausea and vomiting.  Constitutional:   Patient denies fever, night sweats, weight loss, and fatigue.  Skin:   Patient denies skin rash/ lesion and itching.  Eyes:   Patient denies blurred vision and double vision.  Ears/ Nose/ Throat:   Patient denies sore throat and sinus problems.  Hematologic/Lymphatic:   Patient denies swollen glands and easy bruising.  Cardiovascular:   Patient denies leg swelling and chest pains.  Respiratory:   Patient denies cough and shortness of breath.  Endocrine:   Patient denies excessive thirst.  Musculoskeletal:   Patient denies back pain and joint pain.  Neurological:   Patient denies headaches and dizziness.  Psychologic:   Patient denies depression and anxiety.   VITAL SIGNS: None   MULTI-SYSTEM PHYSICAL EXAMINATION:    Constitutional: Well-nourished. No physical deformities. Normally developed. Good grooming.  Neck: Neck symmetrical, not swollen. Normal tracheal position.  Respiratory: No labored breathing, no use of accessory muscles. Clear bilaterally.  Cardiovascular: Normal temperature, normal extremity pulses, no swelling, no varicosities. Regular rate and rhythm.  Lymphatic: No enlargement of neck, axillae, groin.  Skin: No paleness, no jaundice, no cyanosis. No lesion, no ulcer, no rash.  Neurologic / Psychiatric: Oriented to time, oriented to place,  oriented to person. No depression, no anxiety, no agitation.  Gastrointestinal: No mass, no tenderness, no rigidity, non obese abdomen. He has a well-healed lower midline incision from his prior ruptured appendix. He has his prior well-healed robotic port sites.  Eyes: Normal conjunctivae. Normal eyelids.  Ears, Nose, Mouth, and Throat: Left ear no scars, no lesions, no masses. Right ear no scars, no lesions, no masses. Nose no scars, no lesions, no masses. Normal hearing. Normal lips.  Musculoskeletal: Normal gait and station of head and neck.     Complexity of Data:  Lab Test Review:   PSA  Records Review:   Pathology Reports, Previous Patient Records  X-Ray Review: PET Scan: Reviewed Films.    Notes:                     CLINICAL DATA: Recent diagnosis of prostate cancer. Biopsy 1 month  ago. History of renal cell carcinoma last year.   EXAM:  NUCLEAR MEDICINE PET SKULL BASE TO THIGH   TECHNIQUE:  9.8 mCi F18 Piflufolastat (Pylarify) was injected intravenously.  Full-ring PET imaging was performed from the skull base to thigh  after  the radiotracer. CT data was obtained and used for attenuation  correction and anatomic localization.   COMPARISON: Prostate MRI of 03/23/2021. Chest CT 09/05/2020.  Abdominopelvic CT 08/06/2020.   FINDINGS:  NECK   No radiotracer activity in neck lymph nodes.   Incidental CT finding: No cervical adenopathy.   CHEST   No radiotracer accumulation within mediastinal or hilar lymph nodes.  No suspicious pulmonary nodules on the CT scan.   Incidental CT finding: No thoracic adenopathy. Borderline  cardiomegaly.   ABDOMEN/PELVIS   Prostate: Multifocal prostatic tracer uptake. The most significant  is in the lateral left mid to apical gland, including at a S.U.V.  max of 9.7 on 210/4. Right-sided lateral mid gland uptake including  at a S.U.V. max of 4.3.   Lymph nodes: 2 small obturator nodes demonstrate tracer affinity.  Examples at 4 mm  and a S.U.V. max of 3.8 on 198/4 and just caudal to  this at 3 mm and a S.U.V. max of 2.9 on 201/4.   A celiac node measures 8 mm and a S.U.V. max of 2.8 on 131/4.  Similar in size on 08/06/2020.   Liver: No evidence of liver metastasis   Incidental CT finding: Mild hepatic steatosis. Retroaortic  low-density structure on 146/4 measures on the order of 8 mm and is  most consistent with a lymphangioma, when correlated with prior MRI.  Upper pole left renal cyst. Partial right nephrectomy. Scattered  colonic diverticula. Surgical changes in the sigmoid. Abdominal  aortic atherosclerosis. A right inguinal hernia contains  nonobstructive ileum.   SKELETON   No focal activity to suggest skeletal metastasis.   IMPRESSION:  1. Left greater than right tracer avid prostatic primary/primaries.  2. Left obturator nodal metastasis. Isolated tracer avid celiac node  is technically indeterminate, but favored to also represent  metastatic disease.    Electronically Signed  By: Abigail Miyamoto M.D.  On: 04/28/2021 10:51   PROCEDURES: None   ASSESSMENT:      ICD-10 Details  1 GU:   Prostate Cancer - C61    PLAN:           Document Letter(s):  Created for Patient: Clinical Summary         Notes:   1. Very high risk prostate cancer: I did detailed discussion with Mr. Buchberger and his wife today. He has seen both Dr. Alen Blew and Dr. Tammi Klippel. We reviewed his PSMA PET scan indicating what potentially could be early and limited left obturator lymph node involvement. Despite these findings, we did still recommend aggressive therapy of curative intent and reviewed options including long-term androgen deprivation and radiation therapy versus the option of primary surgical therapy with lymphadenectomy and the potential need for adjuvant/salvage multimodality treatment. Finally, I also discussed the option of the Proteus trial.   The patient was counseled about the natural history of prostate cancer and the  standard treatment options that are available for prostate cancer. It was explained to him how his age and life expectancy, clinical stage, Gleason score/prognostic grade group, and PSA (and PSA density) affect his prognosis, the decision to proceed with additional staging studies, as well as how that information influences recommended treatment strategies. We discussed the roles for active surveillance, radiation therapy, surgical therapy, androgen deprivation, as well as ablative therapy and other investigational options for the treatment of prostate cancer as appropriate to his individual cancer situation. We discussed the risks and benefits of these options with regard to their impact on cancer control and  also in terms of potential adverse events, complications, and impact on quality of life particularly related to urinary and sexual function. The patient was encouraged to ask questions throughout the discussion today and all questions were answered to his stated satisfaction. In addition, the patient was provided with and/or directed to appropriate resources and literature for further education about prostate cancer and treatment options. We discussed surgical therapy for prostate cancer including the different available surgical approaches. We discussed, in detail, the risks and expectations of surgery with regard to cancer control, urinary control, and erectile function as well as the expected postoperative recovery process. Additional risks of surgery including but not limited to bleeding, infection, hernia formation, nerve damage, lymphocele formation, bowel/rectal injury potentially necessitating colostomy, damage to the urinary tract resulting in urine leakage, urethral stricture, and the cardiopulmonary risks such as myocardial infarction, stroke, death, venothromboembolism, etc. were explained. The risk of open surgical conversion for robotic/laparoscopic prostatectomy was also discussed.   After  discussion, he is most interested in proceeding with primary surgical therapy and wishes to avoid systemic androgen deprivation if possible. As such, he is not interested in proceeding with radiation therapy as primary treatment and is not interested in the Proteus surgical trial. Based on his pathology parameters, my plan will be to perform a unilateral right nerve sparing robot-assisted laparoscopic radical prostatectomy and bilateral pelvic lymphadenectomy. We did discuss the potential risk of open surgical conversion considering his prior surgical history.   2. Renal cell carcinoma: I do think that his recent PET/CT is adequate particularly for further evaluation of his questionable small pulmonary nodules that are not seen on his current CT scan. As such, I do not think he requires further surveillance for his renal cell carcinoma at this time. We will likely plan to proceed with repeat imaging this summer when he will be scheduled to follow-up for his ongoing prostate cancer care as well.   CC: Bing Matter, PA-C  Dr. Tyler Pita  Dr. Zola Button        Next Appointment:      Next Appointment: 05/13/2021 03:00 PM    Appointment Type: 89 Physical Therapy    Location: [facility] [phone]    Provider: Nyra Capes    Reason for Visit: Initial Pt - Preoperative PT Prior Radical Prostatectomy      E & M CODES: We spent 55 minutes dedicated to evaluation and management time, including face to face interaction, discussions on coordination of care, documentation, result review, and discussion with others as applicable.     * Signed by Raynelle Bring, M.D. on 05/12/21 at 8:37 PM (EST)*

## 2021-06-01 ENCOUNTER — Other Ambulatory Visit: Payer: Self-pay

## 2021-06-01 ENCOUNTER — Ambulatory Visit (HOSPITAL_COMMUNITY): Payer: Medicare Other | Admitting: Physician Assistant

## 2021-06-01 ENCOUNTER — Observation Stay (HOSPITAL_COMMUNITY)
Admission: RE | Admit: 2021-06-01 | Discharge: 2021-06-02 | Disposition: A | Discharge status: Home / Self Care | Payer: Medicare Other | Source: Ambulatory Visit | Attending: Urology | Admitting: Urology

## 2021-06-01 ENCOUNTER — Ambulatory Visit (HOSPITAL_BASED_OUTPATIENT_CLINIC_OR_DEPARTMENT_OTHER): Payer: Medicare Other | Admitting: Certified Registered"

## 2021-06-01 ENCOUNTER — Encounter (HOSPITAL_COMMUNITY)
Admission: RE | Disposition: A | Discharge status: Home / Self Care | Payer: Self-pay | Source: Ambulatory Visit | Attending: Urology

## 2021-06-01 ENCOUNTER — Encounter (HOSPITAL_COMMUNITY): Payer: Self-pay | Admitting: Urology

## 2021-06-01 DIAGNOSIS — Z85520 Personal history of malignant carcinoid tumor of kidney: Secondary | ICD-10-CM | POA: Diagnosis not present

## 2021-06-01 DIAGNOSIS — C775 Secondary and unspecified malignant neoplasm of intrapelvic lymph nodes: Secondary | ICD-10-CM | POA: Diagnosis not present

## 2021-06-01 DIAGNOSIS — Z85528 Personal history of other malignant neoplasm of kidney: Secondary | ICD-10-CM | POA: Diagnosis not present

## 2021-06-01 DIAGNOSIS — C61 Malignant neoplasm of prostate: Secondary | ICD-10-CM

## 2021-06-01 DIAGNOSIS — Z01818 Encounter for other preprocedural examination: Secondary | ICD-10-CM

## 2021-06-01 DIAGNOSIS — Z79899 Other long term (current) drug therapy: Secondary | ICD-10-CM | POA: Insufficient documentation

## 2021-06-01 DIAGNOSIS — I10 Essential (primary) hypertension: Secondary | ICD-10-CM | POA: Insufficient documentation

## 2021-06-01 DIAGNOSIS — R9721 Rising PSA following treatment for malignant neoplasm of prostate: Secondary | ICD-10-CM | POA: Diagnosis present

## 2021-06-01 HISTORY — PX: LYMPHADENECTOMY: SHX5960

## 2021-06-01 HISTORY — PX: ROBOT ASSISTED LAPAROSCOPIC RADICAL PROSTATECTOMY: SHX5141

## 2021-06-01 LAB — HEMOGLOBIN AND HEMATOCRIT, BLOOD
HCT: 46.8 % (ref 39.0–52.0)
Hemoglobin: 15.6 g/dL (ref 13.0–17.0)

## 2021-06-01 LAB — TYPE AND SCREEN
ABO/RH(D): B POS
Antibody Screen: NEGATIVE

## 2021-06-01 SURGERY — XI ROBOTIC ASSISTED LAPAROSCOPIC RADICAL PROSTATECTOMY LEVEL 2
Anesthesia: General | Site: Prostate

## 2021-06-01 MED ORDER — FENTANYL CITRATE (PF) 100 MCG/2ML IJ SOLN
INTRAMUSCULAR | Status: AC
  Filled 2021-06-01: qty 2

## 2021-06-01 MED ORDER — DIPHENHYDRAMINE HCL 12.5 MG/5ML PO ELIX: 12.5000 mg | ORAL_SOLUTION | Freq: Four times a day (QID) | ORAL | Status: DC | PRN

## 2021-06-01 MED ORDER — PANTOPRAZOLE SODIUM 40 MG PO TBEC
40.0000 mg | DELAYED_RELEASE_TABLET | Freq: Every day | ORAL | Status: DC
  Administered 2021-06-02: 40 mg via ORAL
  Filled 2021-06-01: qty 1

## 2021-06-01 MED ORDER — OXYCODONE HCL 5 MG PO TABS
5.0000 mg | ORAL_TABLET | Freq: Once | ORAL | Status: AC | PRN
  Administered 2021-06-01: 5 mg via ORAL

## 2021-06-01 MED ORDER — BELLADONNA ALKALOIDS-OPIUM 16.2-60 MG RE SUPP
1.0000 | Freq: Four times a day (QID) | RECTAL | Status: DC | PRN
  Administered 2021-06-01: 1 via RECTAL
  Filled 2021-06-01 (×2): qty 1

## 2021-06-01 MED ORDER — PROPOFOL 10 MG/ML IV BOLUS
INTRAVENOUS | Status: DC | PRN
Start: 2021-06-01 — End: 2021-06-01
  Administered 2021-06-01: 160 mg via INTRAVENOUS

## 2021-06-01 MED ORDER — SODIUM CHLORIDE 0.9 % IV BOLUS
1000.0000 mL | Freq: Once | INTRAVENOUS | Status: AC
  Administered 2021-06-01: 1000 mL via INTRAVENOUS

## 2021-06-01 MED ORDER — POLYETHYLENE GLYCOL 3350 17 G PO PACK: 17.0000 g | PACK | Freq: Every day | ORAL | Status: DC

## 2021-06-01 MED ORDER — ROCURONIUM BROMIDE 10 MG/ML (PF) SYRINGE
PREFILLED_SYRINGE | INTRAVENOUS | Status: DC | PRN
  Administered 2021-06-01: 10 mg via INTRAVENOUS
  Administered 2021-06-01 (×2): 20 mg via INTRAVENOUS
  Administered 2021-06-01: 70 mg via INTRAVENOUS

## 2021-06-01 MED ORDER — KCL IN DEXTROSE-NACL 20-5-0.45 MEQ/L-%-% IV SOLN
INTRAVENOUS | Status: DC
  Filled 2021-06-01 (×3): qty 1000

## 2021-06-01 MED ORDER — FLEET ENEMA 7-19 GM/118ML RE ENEM: 1.0000 | ENEMA | Freq: Once | RECTAL | Status: DC

## 2021-06-01 MED ORDER — CEFAZOLIN SODIUM-DEXTROSE 1-4 GM/50ML-% IV SOLN
1.0000 g | Freq: Three times a day (TID) | INTRAVENOUS | Status: AC
  Administered 2021-06-01 – 2021-06-02 (×2): 1 g via INTRAVENOUS
  Filled 2021-06-01 (×2): qty 50

## 2021-06-01 MED ORDER — MIDAZOLAM HCL 2 MG/2ML IJ SOLN
INTRAMUSCULAR | Status: AC
  Filled 2021-06-01: qty 2

## 2021-06-01 MED ORDER — HYDROMORPHONE HCL 1 MG/ML IJ SOLN: 0.2500 mg | INTRAMUSCULAR | Status: DC | PRN

## 2021-06-01 MED ORDER — ONDANSETRON HCL 4 MG/2ML IJ SOLN: 4.0000 mg | Freq: Once | INTRAMUSCULAR | Status: DC | PRN

## 2021-06-01 MED ORDER — FENTANYL CITRATE (PF) 250 MCG/5ML IJ SOLN
INTRAMUSCULAR | Status: DC | PRN
  Administered 2021-06-01 (×6): 50 ug via INTRAVENOUS

## 2021-06-01 MED ORDER — ZOLPIDEM TARTRATE 5 MG PO TABS: 5.0000 mg | ORAL_TABLET | Freq: Every evening | ORAL | Status: DC | PRN

## 2021-06-01 MED ORDER — SODIUM CHLORIDE 0.9 % IR SOLN
Status: DC | PRN
  Administered 2021-06-01: 1000 mL via INTRAVESICAL

## 2021-06-01 MED ORDER — HEPARIN SODIUM (PORCINE) 1000 UNIT/ML IJ SOLN
INTRAMUSCULAR | Status: AC
  Filled 2021-06-01: qty 1

## 2021-06-01 MED ORDER — BACITRACIN-NEOMYCIN-POLYMYXIN 400-5-5000 EX OINT: 1.0000 "application " | TOPICAL_OINTMENT | Freq: Three times a day (TID) | CUTANEOUS | Status: DC | PRN

## 2021-06-01 MED ORDER — ACETAMINOPHEN 10 MG/ML IV SOLN
1000.0000 mg | Freq: Once | INTRAVENOUS | Status: DC | PRN
  Administered 2021-06-01: 1000 mg via INTRAVENOUS

## 2021-06-01 MED ORDER — KETOROLAC TROMETHAMINE 15 MG/ML IJ SOLN
15.0000 mg | Freq: Four times a day (QID) | INTRAMUSCULAR | Status: DC
  Administered 2021-06-01 – 2021-06-02 (×4): 15 mg via INTRAVENOUS
  Filled 2021-06-01 (×4): qty 1

## 2021-06-01 MED ORDER — BELLADONNA ALKALOIDS-OPIUM 16.2-60 MG RE SUPP: 1.0000 | Freq: Four times a day (QID) | RECTAL | Status: DC | PRN

## 2021-06-01 MED ORDER — DEXAMETHASONE SODIUM PHOSPHATE 10 MG/ML IJ SOLN
INTRAMUSCULAR | Status: DC | PRN
Start: 2021-06-01 — End: 2021-06-01
  Administered 2021-06-01: 4 mg via INTRAVENOUS

## 2021-06-01 MED ORDER — ONDANSETRON HCL 4 MG/2ML IJ SOLN
INTRAMUSCULAR | Status: DC | PRN
  Administered 2021-06-01: 4 mg via INTRAVENOUS

## 2021-06-01 MED ORDER — ROCURONIUM BROMIDE 10 MG/ML (PF) SYRINGE
PREFILLED_SYRINGE | INTRAVENOUS | Status: AC
  Filled 2021-06-01: qty 10

## 2021-06-01 MED ORDER — CHLORHEXIDINE GLUCONATE CLOTH 2 % EX PADS
6.0000 | MEDICATED_PAD | Freq: Every day | CUTANEOUS | Status: DC
  Administered 2021-06-02: 6 via TOPICAL

## 2021-06-01 MED ORDER — AMLODIPINE BESYLATE 10 MG PO TABS
10.0000 mg | ORAL_TABLET | Freq: Every day | ORAL | Status: DC
  Administered 2021-06-02: 10 mg via ORAL
  Filled 2021-06-01: qty 1

## 2021-06-01 MED ORDER — MORPHINE SULFATE (PF) 2 MG/ML IV SOLN: 2.0000 mg | INTRAVENOUS | Status: DC | PRN

## 2021-06-01 MED ORDER — OXYCODONE HCL 5 MG/5ML PO SOLN: 5.0000 mg | Freq: Once | ORAL | Status: AC | PRN

## 2021-06-01 MED ORDER — CHLORHEXIDINE GLUCONATE 0.12 % MT SOLN
15.0000 mL | Freq: Once | OROMUCOSAL | Status: AC
  Administered 2021-06-01: 15 mL via OROMUCOSAL

## 2021-06-01 MED ORDER — LACTATED RINGERS IV SOLN: INTRAVENOUS | Status: DC | PRN

## 2021-06-01 MED ORDER — ONDANSETRON HCL 4 MG/2ML IJ SOLN: 4.0000 mg | INTRAMUSCULAR | Status: DC | PRN

## 2021-06-01 MED ORDER — ONDANSETRON HCL 4 MG/2ML IJ SOLN
INTRAMUSCULAR | Status: AC
  Filled 2021-06-01: qty 2

## 2021-06-01 MED ORDER — LACTATED RINGERS IV SOLN: INTRAVENOUS | Status: DC

## 2021-06-01 MED ORDER — DEXAMETHASONE SODIUM PHOSPHATE 10 MG/ML IJ SOLN
INTRAMUSCULAR | Status: AC
  Filled 2021-06-01: qty 1

## 2021-06-01 MED ORDER — MIDAZOLAM HCL 2 MG/2ML IJ SOLN
INTRAMUSCULAR | Status: DC | PRN
  Administered 2021-06-01 (×2): 1 mg via INTRAVENOUS

## 2021-06-01 MED ORDER — PROPOFOL 10 MG/ML IV BOLUS
INTRAVENOUS | Status: AC
  Filled 2021-06-01: qty 20

## 2021-06-01 MED ORDER — ACETAMINOPHEN 10 MG/ML IV SOLN
INTRAVENOUS | Status: AC
  Filled 2021-06-01: qty 100

## 2021-06-01 MED ORDER — LIDOCAINE 2% (20 MG/ML) 5 ML SYRINGE
INTRAMUSCULAR | Status: DC | PRN
  Administered 2021-06-01: 40 mg via INTRAVENOUS

## 2021-06-01 MED ORDER — DIPHENHYDRAMINE HCL 50 MG/ML IJ SOLN: 12.5000 mg | Freq: Four times a day (QID) | INTRAMUSCULAR | Status: DC | PRN

## 2021-06-01 MED ORDER — BUPIVACAINE-EPINEPHRINE 0.25% -1:200000 IJ SOLN
INTRAMUSCULAR | Status: AC
  Filled 2021-06-01: qty 1

## 2021-06-01 MED ORDER — CEFAZOLIN SODIUM-DEXTROSE 2-4 GM/100ML-% IV SOLN
2.0000 g | Freq: Once | INTRAVENOUS | Status: AC
  Administered 2021-06-01: 2 g via INTRAVENOUS
  Filled 2021-06-01: qty 100

## 2021-06-01 MED ORDER — ORAL CARE MOUTH RINSE: 15.0000 mL | Freq: Once | OROMUCOSAL | Status: AC

## 2021-06-01 MED ORDER — STERILE WATER FOR IRRIGATION IR SOLN
Status: DC | PRN
  Administered 2021-06-01: 1000 mL

## 2021-06-01 MED ORDER — SULFAMETHOXAZOLE-TRIMETHOPRIM 800-160 MG PO TABS: 1.0000 | ORAL_TABLET | Freq: Two times a day (BID) | ORAL | 0 refills | Status: DC

## 2021-06-01 MED ORDER — SUGAMMADEX SODIUM 200 MG/2ML IV SOLN
INTRAVENOUS | Status: DC | PRN
  Administered 2021-06-01: 200 mg via INTRAVENOUS

## 2021-06-01 MED ORDER — DOCUSATE SODIUM 100 MG PO CAPS
100.0000 mg | ORAL_CAPSULE | Freq: Two times a day (BID) | ORAL | Status: DC
  Administered 2021-06-01 – 2021-06-02 (×2): 100 mg via ORAL
  Filled 2021-06-01 (×2): qty 1

## 2021-06-01 MED ORDER — LACTATED RINGERS IV SOLN
INTRAVENOUS | Status: DC | PRN
  Administered 2021-06-01: 1001 mL

## 2021-06-01 MED ORDER — ACETAMINOPHEN 325 MG PO TABS
650.0000 mg | ORAL_TABLET | ORAL | Status: DC | PRN
  Administered 2021-06-02: 650 mg via ORAL
  Filled 2021-06-01: qty 2

## 2021-06-01 MED ORDER — TRAMADOL HCL 50 MG PO TABS: 50.0000 mg | ORAL_TABLET | Freq: Four times a day (QID) | ORAL | 0 refills | Status: DC | PRN

## 2021-06-01 MED ORDER — BUPIVACAINE-EPINEPHRINE 0.25% -1:200000 IJ SOLN
INTRAMUSCULAR | Status: DC | PRN
  Administered 2021-06-01: 10 mL
  Administered 2021-06-01: 20 mL

## 2021-06-01 MED ORDER — OXYCODONE HCL 5 MG PO TABS
ORAL_TABLET | ORAL | Status: AC
  Filled 2021-06-01: qty 1

## 2021-06-01 SURGICAL SUPPLY — 70 items
ADH SKN CLS APL DERMABOND .7 (GAUZE/BANDAGES/DRESSINGS) ×2
APL PRP STRL LF DISP 70% ISPRP (MISCELLANEOUS) ×2
APL SWBSTK 6 STRL LF DISP (MISCELLANEOUS) ×2
APPLICATOR COTTON TIP 6 STRL (MISCELLANEOUS) ×3 IMPLANT
APPLICATOR COTTON TIP 6IN STRL (MISCELLANEOUS) ×3
BAG COUNTER SPONGE SURGICOUNT (BAG) IMPLANT
BAG SPNG CNTER NS LX DISP (BAG)
CATH FOLEY 2WAY SLVR 18FR 30CC (CATHETERS) ×4 IMPLANT
CATH ROBINSON RED A/P 16FR (CATHETERS) ×4 IMPLANT
CATH ROBINSON RED A/P 8FR (CATHETERS) ×4 IMPLANT
CATH TIEMANN FOLEY 18FR 5CC (CATHETERS) ×4 IMPLANT
CHLORAPREP W/TINT 26 (MISCELLANEOUS) ×4 IMPLANT
CLIP LIGATING HEM O LOK PURPLE (MISCELLANEOUS) ×8 IMPLANT
COVER SURGICAL LIGHT HANDLE (MISCELLANEOUS) ×4 IMPLANT
COVER TIP SHEARS 8 DVNC (MISCELLANEOUS) ×3 IMPLANT
COVER TIP SHEARS 8MM DA VINCI (MISCELLANEOUS) ×3
CUTTER ECHEON FLEX ENDO 45 340 (ENDOMECHANICALS) ×4 IMPLANT
DERMABOND ADVANCED (GAUZE/BANDAGES/DRESSINGS) ×1
DERMABOND ADVANCED .7 DNX12 (GAUZE/BANDAGES/DRESSINGS) ×3 IMPLANT
DRAIN CHANNEL RND F F (WOUND CARE) IMPLANT
DRAPE ARM DVNC X/XI (DISPOSABLE) ×12 IMPLANT
DRAPE COLUMN DVNC XI (DISPOSABLE) ×3 IMPLANT
DRAPE DA VINCI XI ARM (DISPOSABLE) ×12
DRAPE DA VINCI XI COLUMN (DISPOSABLE) ×3
DRAPE SURG IRRIG POUCH 19X23 (DRAPES) ×4 IMPLANT
DRSG TEGADERM 4X4.75 (GAUZE/BANDAGES/DRESSINGS) ×4 IMPLANT
ELECT PENCIL ROCKER SW 15FT (MISCELLANEOUS) ×4 IMPLANT
ELECT REM PT RETURN 15FT ADLT (MISCELLANEOUS) ×4 IMPLANT
GAUZE 4X4 16PLY ~~LOC~~+RFID DBL (SPONGE) ×4 IMPLANT
GAUZE SPONGE 4X4 12PLY STRL (GAUZE/BANDAGES/DRESSINGS) ×4 IMPLANT
GLOVE SURG ENC MOIS LTX SZ6.5 (GLOVE) ×4 IMPLANT
GLOVE SURG ENC TEXT LTX SZ7.5 (GLOVE) ×8 IMPLANT
GLOVE SURG UNDER POLY LF SZ6.5 (GLOVE) ×7 IMPLANT
GLOVE SURG UNDER POLY LF SZ7.5 (GLOVE) ×1 IMPLANT
GOWN STRL REUS W/TWL LRG LVL3 (GOWN DISPOSABLE) ×12 IMPLANT
GOWN STRL REUS W/TWL XL LVL3 (GOWN DISPOSABLE) ×3 IMPLANT
HOLDER FOLEY CATH W/STRAP (MISCELLANEOUS) ×4 IMPLANT
IRRIG SUCT STRYKERFLOW 2 WTIP (MISCELLANEOUS) ×3
IRRIGATION SUCT STRKRFLW 2 WTP (MISCELLANEOUS) ×3 IMPLANT
IV LACTATED RINGERS 1000ML (IV SOLUTION) ×4 IMPLANT
KIT TURNOVER KIT A (KITS) IMPLANT
NDL SAFETY ECLIPSE 18X1.5 (NEEDLE) ×3 IMPLANT
NEEDLE HYPO 18GX1.5 SHARP (NEEDLE) ×3
PACK ROBOT UROLOGY CUSTOM (CUSTOM PROCEDURE TRAY) ×4 IMPLANT
PENCIL SMOKE EVACUATOR (MISCELLANEOUS) IMPLANT
RELOAD STAPLE 45 4.1 GRN THCK (STAPLE) ×3 IMPLANT
SCISSORS LAP 5X45 EPIX DISP (ENDOMECHANICALS) ×1 IMPLANT
SEAL CANN UNIV 5-8 DVNC XI (MISCELLANEOUS) ×12 IMPLANT
SEAL XI 5MM-8MM UNIVERSAL (MISCELLANEOUS) ×12
SET TUBE SMOKE EVAC HIGH FLOW (TUBING) ×4 IMPLANT
SOLUTION ELECTROLUBE (MISCELLANEOUS) ×4 IMPLANT
SPIKE FLUID TRANSFER (MISCELLANEOUS) ×4 IMPLANT
STAPLE RELOAD 45 GRN (STAPLE) ×2 IMPLANT
STAPLE RELOAD 45MM GREEN (STAPLE) ×3
SUT ETHILON 3 0 PS 1 (SUTURE) ×4 IMPLANT
SUT MNCRL 3 0 RB1 (SUTURE) ×3 IMPLANT
SUT MNCRL 3 0 VIOLET RB1 (SUTURE) ×3 IMPLANT
SUT MNCRL AB 4-0 PS2 18 (SUTURE) ×8 IMPLANT
SUT MONOCRYL 3 0 RB1 (SUTURE) ×6
SUT PDS PLUS 0 (SUTURE) ×6
SUT PDS PLUS AB 0 CT-2 (SUTURE) ×6 IMPLANT
SUT VIC AB 0 CT1 27 (SUTURE) ×3
SUT VIC AB 0 CT1 27XBRD ANTBC (SUTURE) ×3 IMPLANT
SUT VIC AB 0 UR5 27 (SUTURE) ×4 IMPLANT
SUT VIC AB 2-0 SH 27 (SUTURE) ×3
SUT VIC AB 2-0 SH 27X BRD (SUTURE) ×3 IMPLANT
SYR 27GX1/2 1ML LL SAFETY (SYRINGE) ×4 IMPLANT
TOWEL OR NON WOVEN STRL DISP B (DISPOSABLE) ×4 IMPLANT
TROCAR XCEL NON-BLD 5MMX100MML (ENDOMECHANICALS) IMPLANT
WATER STERILE IRR 1000ML POUR (IV SOLUTION) ×4 IMPLANT

## 2021-06-01 NOTE — Discharge Instructions (Signed)

## 2021-06-01 NOTE — Anesthesia Preprocedure Evaluation (Signed)
Anesthesia Evaluation  Patient identified by MRN, date of birth, ID band Patient awake    Reviewed: Allergy & Precautions, NPO status , Patient's Chart, lab work & pertinent test results  Airway Mallampati: II  TM Distance: >3 FB Neck ROM: Full    Dental no notable dental hx.    Pulmonary neg pulmonary ROS, former smoker,    Pulmonary exam normal breath sounds clear to auscultation       Cardiovascular hypertension, Normal cardiovascular exam Rhythm:Regular Rate:Normal     Neuro/Psych negative neurological ROS  negative psych ROS   GI/Hepatic Neg liver ROS, GERD  ,  Endo/Other  negative endocrine ROS  Renal/GU Renal diseaseS/P partial nephrectomy  negative genitourinary   Musculoskeletal negative musculoskeletal ROS (+)   Abdominal   Peds negative pediatric ROS (+)  Hematology negative hematology ROS (+)   Anesthesia Other Findings   Reproductive/Obstetrics negative OB ROS                             Anesthesia Physical Anesthesia Plan  ASA: 2  Anesthesia Plan: General   Post-op Pain Management:    Induction: Intravenous  PONV Risk Score and Plan: 2 and Ondansetron, Dexamethasone and Treatment may vary due to age or medical condition  Airway Management Planned: Oral ETT  Additional Equipment:   Intra-op Plan:   Post-operative Plan: Extubation in OR  Informed Consent: I have reviewed the patients History and Physical, chart, labs and discussed the procedure including the risks, benefits and alternatives for the proposed anesthesia with the patient or authorized representative who has indicated his/her understanding and acceptance.     Dental advisory given  Plan Discussed with: CRNA and Surgeon  Anesthesia Plan Comments:         Anesthesia Quick Evaluation

## 2021-06-01 NOTE — Op Note (Signed)
Preoperative diagnosis: Clinically localized adenocarcinoma of the prostate (clinical stage T1c N1 M0)  Postoperative diagnosis: Clinically localized adenocarcinoma of the prostate (clinical stage T1c N1 M0)  Procedure:  Robotic assisted laparoscopic radical prostatectomy (right nerve sparing) Bilateral robotic assisted laparoscopic pelvic lymphadenectomy  Surgeon: Pryor Curia. M.D.  Assistant(s): Debbrah Alar, PA-C  An assistant was required for this surgical procedure.  The duties of the assistant included but were not limited to suctioning, passing suture, camera manipulation, retraction. This procedure would not be able to be performed without an Environmental consultant.   Residsent: Dr. Bishop Limbo  Anesthesia: General  Complications: None  EBL: 100 mL  IVF:  1500 mL crystalloid  Specimens: Prostate and seminal vesicles Right pelvic lymph nodes Left pelvic lymph nodes  Disposition of specimens: Pathology  Drains: 20 Fr coude catheter # 19 Blake pelvic drain  Indication: Derek Cardenas is a 71 y.o. patient with high risk prostate cancer.  After a thorough review of the management options for treatment of prostate cancer, he elected to proceed with surgical therapy and the above procedure(s).  We have discussed the potential benefits and risks of the procedure, side effects of the proposed treatment, the likelihood of the patient achieving the goals of the procedure, and any potential problems that might occur during the procedure or recuperation. Informed consent has been obtained.  Description of procedure:  The patient was taken to the operating room and a general anesthetic was administered. He was given preoperative antibiotics, placed in the dorsal lithotomy position, and prepped and draped in the usual sterile fashion. Next a preoperative timeout was performed. A urethral catheter was placed into the bladder and a site was selected near the umbilicus for placement of  the camera port. This was placed using a standard open Hassan technique which allowed entry into the peritoneal cavity under direct vision and without difficulty. An 8 mm port was placed and a pneumoperitoneum established. The camera was then used to inspect the abdomen and there was no evidence of any intra-abdominal injuries or other abnormalities except for some omental adhesions in the mid abdomen. The remaining abdominal ports were then placed. 8 mm robotic ports were placed in the right lower quadrant, left lower quadrant, and far left lateral abdominal wall. A 5 mm port was placed in the right upper quadrant and a 12 mm port was placed in the right lateral abdominal wall for laparoscopic assistance. All ports were placed under direct vision without difficulty.  The adhesions were taken down sharply with laparoscopic scissors. The surgical cart was then docked.   Utilizing the cautery scissors, the bladder was reflected posteriorly allowing entry into the space of Retzius and identification of the endopelvic fascia and prostate. The periprostatic fat was then removed from the prostate allowing full exposure of the endopelvic fascia. The endopelvic fascia was then incised from the apex back to the base of the prostate bilaterally and the underlying levator muscle fibers were swept laterally off the prostate thereby isolating the dorsal venous complex. The dorsal vein was then stapled and divided with a 45 mm Flex Echelon stapler. Attention then turned to the bladder neck which was divided anteriorly thereby allowing entry into the bladder and exposure of the urethral catheter. The catheter balloon was deflated and the catheter was brought into the operative field and used to retract the prostate anteriorly. The posterior bladder neck was then examined and was divided allowing further dissection between the bladder and prostate posteriorly until the vasa deferentia  and seminal vessels were identified. The vasa  deferentia were isolated, divided, and lifted anteriorly. The seminal vesicles were dissected down to their tips with care to control the seminal vascular arterial blood supply. These structures were then lifted anteriorly and the space between Denonvilliers fascia and the anterior rectum was developed with a combination of sharp and blunt dissection. This isolated the vascular pedicles of the prostate.  The lateral prostatic fascia on the right side of the prostate was then sharply incised allowing release of the neurovascular bundle. The vascular pedicle of the prostate on the right side was then ligated with Weck clips between the prostate and neurovascular bundle and divided with sharp cold scissor dissection resulting in neurovascular bundle preservation. On the left side, a wide non nerve sparing dissection was performed with Weck clips used to ligate the vascular pedicle of the prostate. The neurovascular bundle on the right side was then separated off the apex of the prostate and urethra.  The urethra was then sharply transected allowing the prostate specimen to be disarticulated. The pelvis was copiously irrigated and hemostasis was ensured. There was no evidence for rectal injury.  Attention then turned to the right pelvic sidewall. The fibrofatty tissue between the external iliac vein, confluence of the iliac vessels, hypogastric artery, and Cooper's ligament was dissected free from the pelvic sidewall with care to preserve the obturator nerve. Weck clips were used for lymphostasis and hemostasis. An identical procedure was performed on the contralateral side and the lymphatic packets were removed for permanent pathologic analysis.  Attention then turned to the urethral anastomosis. A 2-0 Vicryl slip knot was placed between Denonvilliers fascia, the posterior bladder neck, and the posterior urethra to reapproximate these structures. A double-armed 3-0 Monocryl suture was then used to perform a  360 running tension-free anastomosis between the bladder neck and urethra. A new urethral catheter was then placed into the bladder and irrigated. There were no blood clots within the bladder and the anastomosis appeared to be watertight. A #19 Blake drain was then brought through the left lateral 8 mm port site and positioned appropriately within the pelvis. It was secured to the skin with a nylon suture. The surgical cart was then undocked. The right lateral 12 mm port site was closed at the fascial level with a 0 Vicryl suture placed laparoscopically. All remaining ports were then removed under direct vision. The prostate specimen was removed intact within the Endopouch retrieval bag via the periumbilical camera port site. This fascial opening was closed with two running 0 Vicryl sutures. 0.25% Marcaine was then injected into all port sites and all incisions were reapproximated at the skin level with 4-0 Monocryl subcuticular sutures. Dermabond was applied. The patient appeared to tolerate the procedure well and without complications. The patient was able to be extubated and transferred to the recovery unit in satisfactory condition.   Pryor Curia MD

## 2021-06-01 NOTE — Interval H&P Note (Signed)
History and Physical Interval Note:  06/01/2021 10:17 AM  Derek Cardenas  has presented today for surgery, with the diagnosis of PROSTATE CANCER.  The various methods of treatment have been discussed with the patient and family. After consideration of risks, benefits and other options for treatment, the patient has consented to  Procedure(s): XI ROBOTIC ASSISTED LAPAROSCOPIC RADICAL PROSTATECTOMY LEVEL 2 (N/A) LYMPHADENECTOMY, PELVIC (Bilateral) as a surgical intervention.  The patient's history has been reviewed, patient examined, no change in status, stable for surgery.  I have reviewed the patient's chart and labs.  Questions were answered to the patient's satisfaction.     [employer]

## 2021-06-01 NOTE — Progress Notes (Signed)
Patient ID: Derek Cardenas, male   DOB: 1950/08/31, 71 y.o.   MRN: [id number]  Post-op note  Subjective: The patient is doing well.  No complaints.  Objective: Vital signs in last 24 hours: Temp:  [97.7 F (36.5 C)-98.1 F (36.7 C)] 97.8 F (36.6 C) (02/13 1630) Pulse Rate:  [83-106] 100 (02/13 1630) Resp:  [11-18] 13 (02/13 1630) BP: (129-150)/(78-97) 138/85 (02/13 1630) SpO2:  [92 %-99 %] 96 % (02/13 1630)  Intake/Output from previous day: No intake/output data recorded. Intake/Output this shift: Total I/O In: 2400 [I.V.:1400; IV Piggyback:1000] Out: 100 [Blood:100]  Physical Exam:  General: Alert and oriented. Abdomen: Soft, Nondistended. Incisions: Clean and dry. GU: Urine draining well.  Lab Results: Recent Labs    06/01/21 1614  HGB 15.6  HCT 46.8    Assessment/Plan: POD#0   1) Continue to monitor, ambulate, IS   Derek Cardenas. MD   LOS: 0 days   Derek Cardenas 06/01/2021, 4:51 PM

## 2021-06-01 NOTE — Progress Notes (Signed)
°   06/01/21 2100  Mobility  Activity Ambulated with assistance in room  Range of Motion/Exercises Active;All extremities  Level of Assistance Contact guard assist, steadying assist  Assistive Device Front wheel walker  Distance Ambulated (ft) 120 ft  Activity Response Tolerated fair   Layla Maw, RN

## 2021-06-01 NOTE — Progress Notes (Addendum)
°   06/01/21 2020  Provider Notification  Provider Name/Title Bishop Limbo, MD  Date Provider Notified 06/01/21  Time Provider Notified 2020  Notification Type Page  Notification Reason Change in status  Provider response At bedside;No new orders  Date of Provider Response 06/01/21  Time of Provider Response 2022   Patient complained of numbness in the left arm and left leg. Urology on-call MD notified. MD came to bedside to assess patient. After assessment of patient, MD stated to continue to monitor. No new orders at this time. Day and Night RN witnessed at bedside. Will continue to monitor.  Layla Maw, RN

## 2021-06-01 NOTE — Transfer of Care (Signed)
Immediate Anesthesia Transfer of Care Note  Patient: Derek Cardenas  Procedure(s) Performed: XI ROBOTIC ASSISTED LAPAROSCOPIC RADICAL PROSTATECTOMY LEVEL 2 (Prostate) LYMPHADENECTOMY, PELVIC (Bilateral: Pelvis)  Patient Location: PACU  Anesthesia Type:General  Level of Consciousness: sedated  Airway & Oxygen Therapy: Patient Spontanous Breathing and Patient connected to face mask oxygen  Post-op Assessment: Report given to RN and Post -op Vital signs reviewed and stable  Post vital signs: Reviewed and stable  Last Vitals:  Vitals Value Taken Time  BP    Temp    Pulse 106 06/01/21 1449  Resp 24 06/01/21 1449  SpO2 99 % 06/01/21 1449  Vitals shown include unvalidated device data.  Last Pain:  Vitals:   06/01/21 1002  TempSrc:   PainSc: 0-No pain         Complications: No notable events documented.

## 2021-06-01 NOTE — Anesthesia Procedure Notes (Signed)
Procedure Name: Intubation Date/Time: 06/01/2021 11:21 AM Performed by: Eben Burow, CRNA Pre-anesthesia Checklist: Patient identified, Emergency Drugs available, Suction available, Patient being monitored and Timeout performed Patient Re-evaluated:Patient Re-evaluated prior to induction Oxygen Delivery Method: Circle system utilized Preoxygenation: Pre-oxygenation with 100% oxygen Induction Type: IV induction Ventilation: Mask ventilation without difficulty Laryngoscope Size: Mac and 4 Grade View: Grade I Tube type: Oral Tube size: 7.5 mm Number of attempts: 1 Airway Equipment and Method: Stylet Placement Confirmation: ETT inserted through vocal cords under direct vision, positive ETCO2 and breath sounds checked- equal and bilateral Secured at: 22 cm Tube secured with: Tape Dental Injury: Teeth and Oropharynx as per pre-operative assessment

## 2021-06-01 NOTE — Progress Notes (Signed)
Observed patient having trouble moving left leg completely toward the center of his body. Resistance was felt. MD paged and Night RN made aware.  Derek Cardenas

## 2021-06-01 NOTE — Progress Notes (Signed)
Patient ID: Derek Cardenas, male   DOB: 01/04/1951, 71 y.o.   MRN: [id number]  Progress note  Subjective: I was called to the patient's bedside by the nurse due to patient endorsing numbness of his fingers.  Patient states that since surgery he has felt a numb/tingling sensation of the fingers of his left hand.  Additionally he feels he may have some weakness of the left lower extremity not with plantarflexion or dorsiflexion but with adduction.  Objective: Vital signs in last 24 hours: Temp:  [97.7 F (36.5 C)-98.1 F (36.7 C)] 97.8 F (36.6 C) (02/13 1745) Pulse Rate:  [83-106] 96 (02/13 1745) Resp:  [11-18] 14 (02/13 1745) BP: (125-150)/(78-97) 131/80 (02/13 1745) SpO2:  [92 %-99 %] 96 % (02/13 1745) Weight:  [76.1 kg] 76.1 kg (02/13 1745)  Intake/Output from previous day: No intake/output data recorded. Intake/Output this shift: No intake/output data recorded.  Physical Exam:  General: Alert and oriented. Abdomen: Soft, Nondistended. Incisions: Clean and dry. GU: Urine draining well. Neuro: Pupils equal and reactive to light and accommodation.  Normal sensation to fine touch in all 4 upper and lower extremities.  Normal strength of his upper extremities to finger squeeze, flexion, extension, abduction, abduction at the elbows and shoulder joints respectively.  Normal strength of his lower extremities with plantarflexion, dorsiflexion, leg raise of the bilateral lower extremities as well.  Lab Results: Recent Labs    06/01/21 1614  HGB 15.6  HCT 46.8     Assessment/Plan: POD#0   1) neurologic exam is reassuring.  We will continue to monitor.  If symptoms worsen at all we will reach out to hospitalist for neurologic examination.  Otherwise we will continue with postoperative plan.     LOS: 0 days   Brondon Wann Abu-Salha 06/01/2021, 8:27 PM

## 2021-06-02 ENCOUNTER — Encounter (HOSPITAL_COMMUNITY): Payer: Self-pay | Admitting: Urology

## 2021-06-02 DIAGNOSIS — C61 Malignant neoplasm of prostate: Secondary | ICD-10-CM | POA: Diagnosis not present

## 2021-06-02 LAB — HEMOGLOBIN AND HEMATOCRIT, BLOOD
HCT: 41.5 % (ref 39.0–52.0)
Hemoglobin: 14.2 g/dL (ref 13.0–17.0)

## 2021-06-02 MED ORDER — BISACODYL 10 MG RE SUPP
10.0000 mg | Freq: Once | RECTAL | Status: AC
  Administered 2021-06-02: 10 mg via RECTAL
  Filled 2021-06-02: qty 1

## 2021-06-02 MED ORDER — TRAMADOL HCL 50 MG PO TABS: 50.0000 mg | ORAL_TABLET | Freq: Four times a day (QID) | ORAL | Status: DC | PRN

## 2021-06-02 MED ORDER — TRAMADOL HCL 50 MG PO TABS
100.0000 mg | ORAL_TABLET | Freq: Four times a day (QID) | ORAL | Status: DC | PRN
Start: 2021-06-02 — End: 2021-06-02

## 2021-06-02 MED ORDER — BISACODYL 10 MG RE SUPP
10.0000 mg | Freq: Once | RECTAL | Status: AC
  Administered 2021-06-02: 10 mg via RECTAL

## 2021-06-02 NOTE — TOC Transition Note (Signed)
Transition of Care [facility]) - CM/SW Discharge Note   Patient Details  Name: Derek Cardenas MRN: [id number] Date of Birth: 07-19-1950  [facility] CM/SW Contact:  Leeroy Cha, RN Phone Number: 06/02/2021, 1:36 PM   Clinical Narrative:    Pt dcd to home with rolling walker through [employer]. No other toc needs present.   Final next level of care: Home/Self Care Barriers to Discharge: No Barriers Identified   Patient Goals and CMS Choice Patient states their goals for this hospitalization and ongoing recovery are:: to go home CMS Medicare.gov Compare Post Acute Care list provided to:: Patient Choice offered to / list presented to : Patient  Discharge Placement                       Discharge Plan and Services   Discharge Planning Services: CM Consult Post Acute Care Choice: Durable Medical Equipment          DME Arranged: Gilford Rile rolling DME Agency: [employer] Date DME Agency Contacted: 06/02/21 Time DME Agency Contacted: 1245 Representative spoke with at DME Agency: danielle            Social Determinants of Health ([place]) Interventions     Readmission Risk Interventions No flowsheet data found.

## 2021-06-02 NOTE — Anesthesia Postprocedure Evaluation (Signed)
Anesthesia Post Note  Patient: Derek Cardenas  Procedure(s) Performed: XI ROBOTIC ASSISTED LAPAROSCOPIC RADICAL PROSTATECTOMY LEVEL 2 (Prostate) LYMPHADENECTOMY, PELVIC (Bilateral: Pelvis)     Patient location during evaluation: PACU Anesthesia Type: General Level of consciousness: awake and alert Pain management: pain level controlled Vital Signs Assessment: post-procedure vital signs reviewed and stable Respiratory status: spontaneous breathing, nonlabored ventilation, respiratory function stable and patient connected to nasal cannula oxygen Cardiovascular status: blood pressure returned to baseline and stable Postop Assessment: no apparent nausea or vomiting Anesthetic complications: no   No notable events documented.  Last Vitals:  Vitals:   06/02/21 0552 06/02/21 0920  BP: 122/77 138/83  Pulse: 91 83  Resp: 18   Temp: 37.2 C 36.4 C  SpO2: 94% 95%    Last Pain:  Vitals:   06/02/21 0951  TempSrc:   PainSc: 2                  Leonid Manus S

## 2021-06-02 NOTE — Discharge Summary (Addendum)
Date of admission: 06/01/2021  Date of discharge: 06/02/2021  Admission diagnosis: Prostate Cancer  Discharge diagnosis: Prostate Cancer  History and Physical: For full details, please see admission history and physical. Briefly, Derek Cardenas is a 71 y.o. gentleman with localized prostate cancer.  After discussing management/treatment options, he elected to proceed with surgical treatment.  Hospital Course: Derek Cardenas was taken to the operating room on 06/01/2021 and underwent a robotic assisted laparoscopic radical prostatectomy. He tolerated this procedure well and without complications. Postoperatively, he was able to be transferred to a regular hospital room following recovery from anesthesia.  He was able to begin ambulating the night of surgery. He remained hemodynamically stable overnight.  He had excellent urine output with appropriately minimal output from his pelvic drain and his pelvic drain was removed on POD #1.  He was transitioned to oral pain medication, tolerated a clear liquid diet, and had met all discharge criteria and was able to be discharged home later on POD#1.  POD0 patient experience tingling of his LUE, related to IV infiltraiton.  He also experienced difficulty with Left lwoer extremity adduction. Neuro exam otherwise totally WNL. Thought to be related to obturator nerve stretch during pelvic lymph node dissection. Will continue to monitor closely.  Laboratory values:  Recent Labs    06/01/21 1614 06/02/21 0400  HGB 15.6 14.2  HCT 46.8 41.5    Disposition: Home  Discharge instruction: He was instructed to be ambulatory but to refrain from heavy lifting, strenuous activity, or driving. He was instructed on urethral catheter care.  Discharge medications:   Allergies as of 06/02/2021   No Known Allergies      Medication List     TAKE these medications    amLODipine 10 MG tablet Commonly known as: NORVASC Take 10 mg by mouth daily.   docusate sodium 100  MG capsule Commonly known as: COLACE Take 1 capsule (100 mg total) by mouth 2 (two) times daily.   omeprazole 40 MG capsule Commonly known as: PRILOSEC Take 40 mg by mouth daily.   sulfamethoxazole-trimethoprim 800-160 MG tablet Commonly known as: BACTRIM DS Take 1 tablet by mouth 2 (two) times daily. Start the day prior to foley removal appointment   traMADol 50 MG tablet Commonly known as: Ultram Take 1-2 tablets (50-100 mg total) by mouth every 6 (six) hours as needed for moderate pain or severe pain.               Durable Medical Equipment  (From admission, onward)           Start     Ordered   06/02/21 1330  For home use only DME Walker rolling  Once       Question Answer Comment  Walker: With 5 Inch Wheels   Patient needs a walker to treat with the following condition Prostate cancer ([place])      06/02/21 1329            Followup: He will followup in 1 week for catheter removal and to discuss his surgical pathology results.

## 2021-06-02 NOTE — Plan of Care (Signed)
Discharge instructions reviewed with patient, spouse and daughter.  Changed patient from bedside drainage bag to leg bag and instructed patient and family on how to do this at home. Patient verbalizes that he is supposed to take his Bactrim DS the day prior to his return visit to see MD.  Patient transported via wheelchair to main entrance to be taken home by family.

## 2021-06-02 NOTE — Progress Notes (Signed)
Patient ID: Derek Cardenas, male   DOB: [birth year], 71 y.o.   MRN: [id number]  1 Day Post-Op Subjective: The patient is doing well.  Some nausea and dizziness when ambulating but improved this morning.  Pain is adequately controlled.  He does have persistent left hand numbness that he describes across all 5 digits and into his palm.  No arm numbness.  Denies weakness in his grip strength. He apparently had a left hand IV removed yesterday afternoon/evening due to infiltration. He also noted difficulty yesterday with left lower extremity adduction.  Able to ambulate ok without weakness.  Objective: Vital signs in last 24 hours: Temp:  [97.7 F (36.5 C)-98.9 F (37.2 C)] 98.9 F (37.2 C) (02/14 0552) Pulse Rate:  [83-106] 91 (02/14 0552) Resp:  [11-18] 18 (02/14 0552) BP: (122-150)/(77-97) 122/77 (02/14 0552) SpO2:  [92 %-99 %] 94 % (02/14 0552) Weight:  [76.1 kg] 76.1 kg (02/13 1745)  Intake/Output from previous day: 02/13 0701 - 02/14 0700 In: 4459.2 [P.O.:360; I.V.:2999.2; IV Piggyback:1100] Out: 1770 [Urine:1500; Drains:170; Blood:100] Intake/Output this shift: No intake/output data recorded.  Physical Exam:  General: Alert and oriented. CV: RRR Lungs: Clear bilaterally. GI: Soft, Nondistended. Incisions: Clean, dry, and intact Urine: Clear Extremities: Nontender, no erythema, no edema. Neuro: Normal and symmetric bilateral grip strength.  Sensory deficit of all 5 digits of left hand and palm.  LLE weakness with adduction but without other motor or sensory deficits.  Lab Results: Recent Labs    06/01/21 1614 06/02/21 0400  HGB 15.6 14.2  HCT 46.8 41.5      Assessment/Plan: POD# 1 s/p robotic prostatectomy.  1) SL IVF 2) Ambulate, Incentive spirometry 3) Transition to oral pain medication 4) Dulcolax suppository 5) D/C pelvic drain 6) Left hand numbness is likely due to local infiltration of IV considering distribution of numbness in local area.  LLE adduction deficit  likely related to possible stretch injury of left obturator nerve during lymphadenectomy.  I would expect both of these deficits to improve with time.  Will consider additional PT/neurologic evaluation as an outpatient if needed. 7) Plan for likely discharge later today   Pryor Curia. MD   LOS: 0 days   Dutch Gray 06/02/2021, 7:35 AM

## 2021-06-04 LAB — SURGICAL PATHOLOGY

## 2021-06-05 ENCOUNTER — Telehealth: Payer: Self-pay | Admitting: Genetic Counselor

## 2021-06-05 ENCOUNTER — Encounter: Payer: Self-pay | Admitting: Genetic Counselor

## 2021-06-05 DIAGNOSIS — Z1379 Encounter for other screening for genetic and chromosomal anomalies: Secondary | ICD-10-CM | POA: Insufficient documentation

## 2021-06-05 NOTE — Telephone Encounter (Signed)
I contacted Derek Cardenas to discuss his genetic testing results. No pathogenic variants were identified in the 41 genes analyzed. Detailed clinic note to follow.  The test report has been scanned into EPIC and is located under the Molecular Pathology section of the Results Review tab.  A portion of the result report is included below for reference.   [name], MS, [facility] Genetic Counselor [email or web address] (P) [phone]

## 2021-06-08 ENCOUNTER — Other Ambulatory Visit: Payer: Self-pay

## 2021-06-08 ENCOUNTER — Ambulatory Visit: Payer: Self-pay | Admitting: Genetic Counselor

## 2021-06-08 DIAGNOSIS — C61 Malignant neoplasm of prostate: Secondary | ICD-10-CM

## 2021-06-08 DIAGNOSIS — Z1379 Encounter for other screening for genetic and chromosomal anomalies: Secondary | ICD-10-CM

## 2021-06-08 NOTE — Progress Notes (Signed)
HPI:   Derek Cardenas was previously seen in the [place] clinic due to a personal history of cancer and concerns regarding a hereditary predisposition to cancer. Please refer to our prior cancer genetics clinic note for more information regarding our discussion, assessment and recommendations, at the time. Derek Cardenas recent genetic test results were disclosed to him, as were recommendations warranted by these results. These results and recommendations are discussed in more detail below.  CANCER HISTORY:  Oncology History  Malignant neoplasm of prostate ([place])  03/31/2021 Cancer Staging   Staging form: Prostate, AJCC 8th Edition - Clinical stage from 03/31/2021: Stage IVA (cT2a, cN1, cM0, PSA: 6.3, Grade Group: 5) - Signed by Freeman Caldron, PA-C on 05/12/2021 Histopathologic type: Adenocarcinoma, NOS Prostate specific antigen (PSA) range: Less than 10 Gleason primary pattern: 5 Gleason secondary pattern: 4 Gleason score: 9 Histologic grading system: 5 grade system Number of biopsy cores examined: 20 Number of biopsy cores positive: 10 Location of positive needle core biopsies: One side    05/12/2021 Initial Diagnosis   Malignant neoplasm of prostate ([place])     FAMILY HISTORY:  We obtained a detailed, 4-generation family history.  Significant diagnoses are listed below: Family History  Problem Relation Age of Onset   Melanoma Mother       Derek Cardenas reports his mother had a history of melanoma and died at age 65. Derek Cardenas is unaware of previous family history of genetic testing for hereditary cancer risks.   GENETIC TEST RESULTS:  The [employer] Custom[employer] Panel found no pathogenic mutations.   The [employer] CustomNext gene panel offered by [employer] includes sequencing, rearrangement analysis, and RNA analysis for the following 41 genes:   APC, ATM, AXIN2, BAP1, BARD1, BMPR1A, BRCA1, BRCA2, BRIP1, CDH1, CDK4, CDKN2A, CHEK2, DICER1, FH, FLCN, HOXB13, EPCAM, GREM1, MET, MITF,  MLH1, MSH2, MSH3, MSH6, MUTYH, NBN, NF1, NTHL1, PALB2, PMS2, POLD1, POLE, PTEN, RAD51C, RAD51D, RECQL, SMAD4, SMARCA4, STK11, and TP53.  We have also added the following 5 genes: BAP1, FH, FLCN, MET, MITF.   The test report has been scanned into EPIC and is located under the Molecular Pathology section of the Results Review tab.  A portion of the result report is included below for reference. Genetic testing reported out on 05/29/2021.         Even though a pathogenic variant was not identified, possible explanations for his personal history of cancer may include: There may be no hereditary risk for cancer in the family. The cancers in Derek Cardenas may be due to other genetic or environmental factors. There may be a gene mutation in one of these genes that current testing methods cannot detect, but that chance is small. There could be another gene that has not yet been discovered, or that we have not yet tested, that is responsible for the cancer diagnoses in the family.   Therefore, it is important to remain in touch with cancer genetics in the future so that we can continue to offer Derek Cardenas the most up to date genetic testing.   ADDITIONAL GENETIC TESTING:  We discussed with Derek Cardenas that his genetic testing was fairly extensive.  If there are genes identified to increase cancer risk that can be analyzed in the future, we would be happy to discuss and coordinate this testing at that time.    CANCER SCREENING RECOMMENDATIONS:  Derek Cardenas test result is considered negative (normal).  This means that we have not identified a hereditary cause for  his personal history of cancer at this time. Most cancers happen by chance and this negative test suggests that his cancer may fall into this category.    An individual's cancer risk and medical management are not determined by genetic test results alone. Overall cancer risk assessment incorporates additional factors, including personal medical history,  family history, and any available genetic information that may result in a personalized plan for cancer prevention and surveillance. Therefore, it is recommended he continue to follow the cancer management and screening guidelines provided by his oncology and primary healthcare provider.  RECOMMENDATIONS FOR FAMILY MEMBERS:   Since he did not inherit a mutation in a cancer predisposition gene included on this panel, his children could not have inherited a mutation from him in one of these genes.  FOLLOW-UP:  Cancer genetics is a rapidly advancing field and it is possible that new genetic tests will be appropriate for him and/or his family members in the future. We encouraged him to remain in contact with cancer genetics on an annual basis so we can update his personal and family histories and let him know of advances in cancer genetics that may benefit this family.   Our contact number was provided. Derek Cardenas questions were answered to his satisfaction, and he knows he is welcome to call us at anytime with additional questions or concerns.   [name], MS, [facility] Genetic Counselor [email or web address] (P) [phone]

## 2021-07-13 ENCOUNTER — Telehealth: Payer: Self-pay | Admitting: *Deleted

## 2021-07-14 ENCOUNTER — Other Ambulatory Visit: Payer: Self-pay

## 2021-07-14 ENCOUNTER — Encounter: Payer: Self-pay | Admitting: *Deleted

## 2021-07-14 ENCOUNTER — Inpatient Hospital Stay: Payer: Medicare Other | Attending: Oncology | Admitting: *Deleted

## 2021-07-14 VITALS — BP 116/73 | HR 84 | Temp 97.7°F | Resp 18 | Ht 66.0 in | Wt 168.9 lb

## 2021-07-14 DIAGNOSIS — C61 Malignant neoplasm of prostate: Secondary | ICD-10-CM

## 2021-07-14 NOTE — Progress Notes (Signed)
?  Pt's vital signs are within normal range. Pt denies pain but does  still experience some fatigue on most days.Pt says some days are better than others. Pt does walk for exercise about 3 days out of the week. Pt says the urinary urgency is mostly in daytime but feels like it is getting better since surgery. He seems to have more control now with the incontinence but still has some urinary leakage. Pt does have some nocturia , usually getting up to bathroom 3-4 times at night. He says he is still sleeping well in spite of getting up so many times. Issues with constipation has resolved since surgery. Last seen PCP Sept,2022. Last colonoscopy was 2010. I advised pt to schedule colonoscopy for this year. Pt has a reddened circular area to the right neck. I asked him to revisit this with his PCP to make sure this is monitored. He has never seen a dermatologist. SCP reviewed and completed. ?

## 2021-08-05 ENCOUNTER — Telehealth: Payer: Self-pay | Admitting: Dermatology

## 2021-08-05 NOTE — Telephone Encounter (Signed)
Doesn't want to wait until December for NP appt. Ref: Bing Matter ?

## 2021-08-05 NOTE — Telephone Encounter (Signed)
Notes documented and referral closed. ?

## 2021-08-13 NOTE — Progress Notes (Signed)
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 ? ?Patient proceed with surgical intervention on 06/01/2021.   ? ?Patient has had follow up's with urology, and is scheduled for his first post op PSA draw on 09/01/2021.  ?

## 2021-09-11 ENCOUNTER — Telehealth: Payer: Self-pay | Admitting: Radiation Oncology

## 2021-09-11 NOTE — Telephone Encounter (Signed)
Called patient to schedule a consultation w. Dr. Manning. No answer, LVM for a return call.  ?

## 2021-09-23 NOTE — Progress Notes (Signed)
GU Location of Tumor / Histology: Prostate Ca  If Prostate Cancer, Gleason Score is (5 + 4) and PSA is (0.32 as of 09/01/2021)  Other Dx:  Renal cell carcinoma,right  Biopsies  Dr. Alinda Money        Past/Anticipated interventions by urology, if any:     Past/Anticipated interventions by medical oncology, if any: NA  Weight changes, if any:  No  IPSS:  6 SHIM:  6  Bowel/Bladder complaints, if any:  No  Nausea/Vomiting, if any:  No  Pain issues, if any:    SAFETY ISSUES: Prior radiation? No Pacemaker/ICD? No Possible current pregnancy? Male Is the patient on methotrexate? No  Current Complaints / other details:  Prostatectomy (05/2021)

## 2021-09-28 ENCOUNTER — Ambulatory Visit
Admission: RE | Admit: 2021-09-28 | Discharge: 2021-09-28 | Disposition: A | Discharge status: Home / Self Care | Payer: Medicare Other | Source: Ambulatory Visit | Attending: Radiation Oncology | Admitting: Radiation Oncology

## 2021-09-28 ENCOUNTER — Other Ambulatory Visit: Payer: Self-pay

## 2021-09-28 VITALS — BP 123/79 | HR 77 | Temp 97.3°F | Resp 20 | Wt 176.6 lb

## 2021-09-28 DIAGNOSIS — C61 Malignant neoplasm of prostate: Secondary | ICD-10-CM

## 2021-09-28 NOTE — Progress Notes (Signed)
Radiation Oncology         [phone] ________________________________  Outpatient Re-Consultation  Name: Derek Cardenas MRN: [id number]  Date: 09/28/2021  DOB: October 14, 1950  LS:LHTDSK, Baldemar Friday., PA-C  Raynelle Bring, MD   REFERRING PHYSICIAN: Raynelle Bring, MD  DIAGNOSIS: 71 y.o. gentleman with a postoperative PSA of 0.32 s/p RALP 05/2021 for Stage pT3bN1, Gleason 5+4 prostate cancer    ICD-10-CM   1. Malignant neoplasm of prostate ([place])  C61        HISTORY OF PRESENT ILLNESS::Derek Cardenas is a 71 y.o. 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 He was initially diagnosed with Gleason 5+4 prostate cancer by Dr. Alinda Money on 03/31/21.  A PSMA PET scan was performed on 04/27/21 for disease staging and showed left greater than right tracer-avid prostatic primary/primaries with left obturator nodal metastasis and an isolated, tracer-avid celiac node, technically indeterminate and not in a typical location for prostate cancer metastasis.         The patient was seen in the multidisciplinary prostate cancer clinic on 05/12/21. At that time, he opted to proceed with prostatectomy. Performed on 06/01/21 by Dr. Alinda Money, pathology from the procedure showed: Gleason 5+4 prostatic adenocarcinoma with extraprostatic extension present at left posterolateral mid and base, left seminal vesicle invasion, lymphovascular invasion, and 6 out of a total of 14 lymph nodes (11 left and 3 right, 0.8 cm with extranodal extension). All resection margins were negative. His postoperative PSA was detectable at 0.32.  He has reviewed the pathology and PSA results with his urologist and has been kindly referred back to [place] today for consideration of potential salvage radiation therapy.  PREVIOUS RADIATION THERAPY: No  PAST MEDICAL HISTORY:  has a past medical history of Diverticulosis, GERD  (gastroesophageal reflux disease), Hypertension, and right renal ca (dx'd 07/2020).    PAST SURGICAL HISTORY: Past Surgical History:  Procedure Laterality Date   BOWEL RESECTION     LAPAROSCOPIC APPENDECTOMY N/A 08/06/2020   Procedure: APPENDECTOMY LAPAROSCOPIC;  Surgeon: Stark Klein, MD;  Location: [facility];  Service: General;  Laterality: N/A;   LYMPHADENECTOMY Bilateral 06/01/2021   Procedure: Noel Journey, PELVIC;  Surgeon: Raynelle Bring, MD;  Location: [facility];  Service: Urology;  Laterality: Bilateral;   OPERATIVE ULTRASOUND N/A 09/29/2020   Procedure: INTRA OPERATIVE ULTRASOUND;  Surgeon: Raynelle Bring, MD;  Location: [facility];  Service: Urology;  Laterality: N/A;   ROBOT ASSISTED LAPAROSCOPIC RADICAL PROSTATECTOMY N/A 06/01/2021   Procedure: XI ROBOTIC ASSISTED LAPAROSCOPIC RADICAL PROSTATECTOMY LEVEL 2;  Surgeon: Raynelle Bring, MD;  Location: [facility];  Service: Urology;  Laterality: N/A;   ROBOTIC ASSITED PARTIAL NEPHRECTOMY Right 09/29/2020   Procedure: XI ROBOTIC ASSITED [place];  Surgeon: Raynelle Bring, MD;  Location: [facility];  Service: Urology;  Laterality: Right;    FAMILY HISTORY: family history includes Melanoma in his mother.  SOCIAL HISTORY:  reports that he has quit smoking. His smoking use included cigarettes. He quit smokeless tobacco use about 13 months ago.  His smokeless tobacco use included chew. He reports that he does not drink alcohol and does not use drugs.  ALLERGIES: Patient has no known allergies.  MEDICATIONS:  Current Outpatient Medications  Medication Sig Dispense Refill   doxycycline (VIBRAMYCIN) 100 MG capsule Take by mouth.     amLODipine (NORVASC) 10 MG tablet Take 10 mg by mouth daily.     Cetirizine HCl (ZYRTEC ALLERGY  PO) Take 10 mg by mouth daily.     Docusate Sodium (DSS) 100 MG CAPS Take by mouth.     famotidine (PEPCID) 10 MG tablet Take 10 mg by mouth 2 (two) times daily.     metroNIDAZOLE (METROGEL) 0.75 % gel Apply  topically daily.     omeprazole (PRILOSEC) 40 MG capsule Take 40 mg by mouth daily.     sildenafil (VIAGRA) 100 MG tablet SMARTSIG:1 Tablet(s) By Mouth As Directed PRN     No current facility-administered medications for this encounter.    REVIEW OF SYSTEMS:  On review of systems, the patient reports that he is doing well overall. He denies any chest pain, shortness of breath, cough, fevers, chills, night sweats, unintended weight changes. He denies any bowel disturbances, and denies abdominal pain, nausea or vomiting. He denies any new musculoskeletal or joint aches or pains. His IPSS was 6, indicating mild urinary symptoms. His SHIM was 6, indicating he likely has postoperative erectile dysfunction. A complete review of systems is obtained and is otherwise negative.   PHYSICAL EXAM:  Wt Readings from Last 3 Encounters:  09/28/21 176 lb 9.6 oz (80.1 kg)  07/14/21 168 lb 14.4 oz (76.6 kg)  06/01/21 167 lb 12.8 oz (76.1 kg)   Temp Readings from Last 3 Encounters:  09/28/21 (!) 97.3 F (36.3 C)  07/14/21 97.7 F (36.5 C) (Oral)  06/02/21 97.6 F (36.4 C) (Oral)   BP Readings from Last 3 Encounters:  09/28/21 123/79  07/14/21 116/73  06/02/21 138/83   Pulse Readings from Last 3 Encounters:  09/28/21 77  07/14/21 84  06/02/21 83   Pain Assessment Pain Score: 0-No pain/10  In general this is a well appearing caucasian male in no acute distress. He's alert and oriented x4 and appropriate throughout the examination. Cardiopulmonary assessment is negative for acute distress and he exhibits normal effort.    KPS = 100  100 - Normal; no complaints; no evidence of disease. 90   - Able to carry on normal activity; minor signs or symptoms of disease. 80   - Normal activity with effort; some signs or symptoms of disease. 49   - Cares for self; unable to carry on normal activity or to do active work. 60   - Requires occasional assistance, but is able to care for most of his personal  needs. 50   - Requires considerable assistance and frequent medical care. 70   - Disabled; requires special care and assistance. 55   - Severely disabled; hospital admission is indicated although death not imminent. 40   - Very sick; hospital admission necessary; active supportive treatment necessary. 10   - Moribund; fatal processes progressing rapidly. 0     - Dead  Karnofsky DA, Abelmann Carnegie, Craver LS and Burchenal [facility] [phone]) The use of the nitrogen mustards in the palliative treatment of carcinoma: with particular reference to bronchogenic carcinoma Cancer 1 634-56   LABORATORY DATA:  Lab Results  Component Value Date   WBC 8.2 05/25/2021   HGB 14.2 06/02/2021   HCT 41.5 06/02/2021   MCV 80.3 05/25/2021   PLT 195 05/25/2021   Lab Results  Component Value Date   NA 138 05/25/2021   K 4.4 05/25/2021   CL 104 05/25/2021   CO2 27 05/25/2021   Lab Results  Component Value Date   ALT 20 08/06/2020   AST 24 08/06/2020   ALKPHOS 68 08/06/2020   BILITOT 2.1 (H) 08/06/2020     RADIOGRAPHY:  No results found.    IMPRESSION/PLAN: 71 y.o. gentleman with a postoperative PSA of 0.32 s/p RALP 05/2021 for Stage pT3bN1, Gleason 5+4 prostate cancer  Today I reviewed the findings and workup thus far.  We discussed the natural history of prostate cancer.  We reviewed the the implications of positive margins, extracapsular extension, and seminal vesicle involvement on the risk of prostate cancer recurrence. In his case, extraprostatic extension, seminal vesicle invasion, lymphovascular invasion, and positive lymph nodes were present. We reviewed some of the evidence suggesting an advantage for patients who undergo adjuvant radiotherapy in the setting in terms of disease control and overall survival. We also discussed some of the dilemmas related to the available evidence.  We discussed the SWOG trial which did show an improvement in disease-free survival as well as overall survival using adjuvant  radiotherapy. However, we discussed the fact that the study did not carefully control the usage of adjuvant radiotherapy in the observation arm. There is increasing evidence that careful surveillance with ultrasensitive PSA may provide an opportunity for early salvage in patients who undergo observation, which can lead to excellent results in terms of disease control and survival. We discussed radiation treatment directed to the prostatic fossa with regard to the logistics and delivery of external beam radiation treatment.  At the conclusion of our conversation, the patient is interested in moving forward with 7.5 weeks of salvage external beam therapy in combination with ADT. He has not received his first Lupron injection. We will share our discussion with Dr. Alinda Money and make arrangements for start of ADT prior to simulation. The patient appears to have a good understanding of his disease and our treatment recommendations which are of curative salvage intent and is in agreement with the stated plan.  Therefore, we will move forward with treatment planning accordingly, in anticipation of beginning IMRT in the near future.  We personally spent 60 minutes in this encounter including chart review, reviewing radiological studies, meeting face-to-face with the patient, entering orders and completing documentation.   ------------------------------------------------   Tyler Pita, MD [place]: [phone]  Fax: [phone] [email or web address]  Skype  [email or web address]   This document serves as a record of services personally performed by Tyler Pita, MD. It was created on his behalf by Wilburn Mylar, a trained medical scribe. The creation of this record is based on the scribe's personal observations and the provider's statements to them. This document has been checked and approved by the attending provider.

## 2021-09-28 NOTE — Progress Notes (Signed)
RN spoke with Anderson Malta at [facility] to help coordinate start of ADT.    Pending appointment review at this time, will call back once appt is scheduled to confirm.  Will continue to follow.

## 2021-10-01 ENCOUNTER — Other Ambulatory Visit: Payer: Self-pay

## 2021-10-01 ENCOUNTER — Ambulatory Visit
Admission: RE | Admit: 2021-10-01 | Discharge: 2021-10-01 | Disposition: A | Discharge status: Home / Self Care | Payer: Medicare Other | Source: Ambulatory Visit | Attending: Radiation Oncology | Admitting: Radiation Oncology

## 2021-10-01 DIAGNOSIS — C61 Malignant neoplasm of prostate: Secondary | ICD-10-CM | POA: Diagnosis present

## 2021-10-01 DIAGNOSIS — Z510 Encounter for antineoplastic radiation therapy: Secondary | ICD-10-CM | POA: Diagnosis not present

## 2021-10-02 NOTE — Progress Notes (Signed)
  Radiation Oncology         [phone] ________________________________  Name: Derek Cardenas MRN: [id number]  Date: 10/01/2021  DOB: [birth year]  SIMULATION AND TREATMENT PLANNING NOTE    ICD-10-CM   1. Malignant neoplasm of prostate ([place])  C61       DIAGNOSIS:  71 y.o. gentleman with a postoperative PSA of 0.32 s/p RALP 05/2021 for Stage pT3bN1, Gleason 5+4 prostate cancer  NARRATIVE:  The patient was brought to the [facility].  Identity was confirmed.  All relevant records and images related to the planned course of therapy were reviewed.  The patient freely provided informed written consent to proceed with treatment after reviewing the details related to the planned course of therapy. The consent form was witnessed and verified by the simulation staff.  Then, the patient was set-up in a stable reproducible supine position for radiation therapy.  A vacuum lock pillow device was custom fabricated to position his legs in a reproducible immobilized position.  Then, I performed a urethrogram under sterile conditions to identify the prostatic bed.  CT images were obtained.  Surface markings were placed.  The CT images were loaded into the planning software.  Then the prostate bed target, pelvic lymph node target and avoidance structures including the rectum, bladder, bowel and hips were contoured.  Treatment planning then occurred.  The radiation prescription was entered and confirmed.  A total of one complex treatment devices were fabricated. I have requested : Intensity Modulated Radiotherapy (IMRT) is medically necessary for this case for the following reason:  Rectal sparing.Marland Kitchen  PLAN:  The patient will receive 45 Gy in 25 fractions of 1.8 Gy, followed by a boost to the prostate bed to a total dose of 68.4 Gy with 13 additional fractions of 1.8 Gy.   ________________________________  Sheral Apley Tammi Klippel, M.D.

## 2021-10-09 DIAGNOSIS — Z510 Encounter for antineoplastic radiation therapy: Secondary | ICD-10-CM | POA: Diagnosis not present

## 2021-10-14 ENCOUNTER — Other Ambulatory Visit: Payer: Self-pay

## 2021-10-14 ENCOUNTER — Ambulatory Visit
Admission: RE | Admit: 2021-10-14 | Discharge: 2021-10-14 | Disposition: A | Discharge status: Home / Self Care | Payer: Medicare Other | Source: Ambulatory Visit | Attending: Radiation Oncology | Admitting: Radiation Oncology

## 2021-10-14 DIAGNOSIS — Z510 Encounter for antineoplastic radiation therapy: Secondary | ICD-10-CM | POA: Diagnosis not present

## 2021-10-14 LAB — RAD ONC ARIA SESSION SUMMARY
Course Elapsed Days: 0
Plan Fractions Treated to Date: 1
Plan Prescribed Dose Per Fraction: 1.8 Gy
Plan Total Fractions Prescribed: 25
Plan Total Prescribed Dose: 45 Gy
Reference Point Dosage Given to Date: 1.8 Gy
Reference Point Session Dosage Given: 1.8 Gy
Session Number: 1

## 2021-10-15 ENCOUNTER — Ambulatory Visit
Admission: RE | Admit: 2021-10-15 | Discharge: 2021-10-15 | Disposition: A | Discharge status: Home / Self Care | Payer: Medicare Other | Source: Ambulatory Visit | Attending: Radiation Oncology | Admitting: Radiation Oncology

## 2021-10-15 ENCOUNTER — Other Ambulatory Visit: Payer: Self-pay

## 2021-10-15 DIAGNOSIS — Z510 Encounter for antineoplastic radiation therapy: Secondary | ICD-10-CM | POA: Diagnosis not present

## 2021-10-15 LAB — RAD ONC ARIA SESSION SUMMARY
Course Elapsed Days: 1
Plan Fractions Treated to Date: 2
Plan Prescribed Dose Per Fraction: 1.8 Gy
Plan Total Fractions Prescribed: 25
Plan Total Prescribed Dose: 45 Gy
Reference Point Dosage Given to Date: 3.6 Gy
Reference Point Session Dosage Given: 1.8 Gy
Session Number: 2

## 2021-10-16 ENCOUNTER — Other Ambulatory Visit: Payer: Self-pay

## 2021-10-16 ENCOUNTER — Ambulatory Visit
Admission: RE | Admit: 2021-10-16 | Discharge: 2021-10-16 | Disposition: A | Discharge status: Home / Self Care | Payer: Medicare Other | Source: Ambulatory Visit | Attending: Radiation Oncology | Admitting: Radiation Oncology

## 2021-10-16 DIAGNOSIS — Z510 Encounter for antineoplastic radiation therapy: Secondary | ICD-10-CM | POA: Diagnosis not present

## 2021-10-16 DIAGNOSIS — C61 Malignant neoplasm of prostate: Secondary | ICD-10-CM

## 2021-10-16 LAB — RAD ONC ARIA SESSION SUMMARY
Course Elapsed Days: 2
Plan Fractions Treated to Date: 3
Plan Prescribed Dose Per Fraction: 1.8 Gy
Plan Total Fractions Prescribed: 25
Plan Total Prescribed Dose: 45 Gy
Reference Point Dosage Given to Date: 5.4 Gy
Reference Point Session Dosage Given: 1.8 Gy
Session Number: 3

## 2021-10-16 MED ORDER — SONAFINE EX EMUL
1.0000 | Freq: Two times a day (BID) | CUTANEOUS | Status: DC
  Administered 2021-10-16: 1 via TOPICAL

## 2021-10-16 NOTE — Progress Notes (Signed)
Pt here for patient teaching. Pt given Radiation and You booklet, skin care instructions, and Sonafine. Reviewed areas of pertinence such as diarrhea, fatigue, hair loss, nausea and vomiting, sexual and fertility changes, skin changes, and urinary and bladder changes . Pt able to give teach back of to pat skin, use unscented/gentle soap, use baby wipes, have Imodium on hand, drink plenty of water, and sitz bath, apply Sonafine bid, avoid applying anything to skin within 4 hours of treatment, and to use an electric razor if they must shave. Pt verbalizes understanding of information given and will contact nursing with any questions or concerns.     [email or web address].org/treatmentinformation/whattoexpect/index      

## 2021-10-19 ENCOUNTER — Ambulatory Visit
Admission: RE | Admit: 2021-10-19 | Discharge: 2021-10-19 | Disposition: A | Discharge status: Home / Self Care | Payer: Medicare Other | Source: Ambulatory Visit | Attending: Radiation Oncology | Admitting: Radiation Oncology

## 2021-10-19 ENCOUNTER — Other Ambulatory Visit: Payer: Self-pay

## 2021-10-19 DIAGNOSIS — Z510 Encounter for antineoplastic radiation therapy: Secondary | ICD-10-CM | POA: Diagnosis not present

## 2021-10-19 DIAGNOSIS — C61 Malignant neoplasm of prostate: Secondary | ICD-10-CM | POA: Insufficient documentation

## 2021-10-19 LAB — RAD ONC ARIA SESSION SUMMARY
Course Elapsed Days: 5
Plan Fractions Treated to Date: 4
Plan Prescribed Dose Per Fraction: 1.8 Gy
Plan Total Fractions Prescribed: 25
Plan Total Prescribed Dose: 45 Gy
Reference Point Dosage Given to Date: 7.2 Gy
Reference Point Session Dosage Given: 1.8 Gy
Session Number: 4

## 2021-10-21 ENCOUNTER — Other Ambulatory Visit: Payer: Self-pay

## 2021-10-21 ENCOUNTER — Ambulatory Visit
Admission: RE | Admit: 2021-10-21 | Discharge: 2021-10-21 | Disposition: A | Discharge status: Home / Self Care | Payer: Medicare Other | Source: Ambulatory Visit | Attending: Radiation Oncology | Admitting: Radiation Oncology

## 2021-10-21 DIAGNOSIS — Z510 Encounter for antineoplastic radiation therapy: Secondary | ICD-10-CM | POA: Diagnosis not present

## 2021-10-21 LAB — RAD ONC ARIA SESSION SUMMARY
Course Elapsed Days: 7
Plan Fractions Treated to Date: 5
Plan Prescribed Dose Per Fraction: 1.8 Gy
Plan Total Fractions Prescribed: 25
Plan Total Prescribed Dose: 45 Gy
Reference Point Dosage Given to Date: 9 Gy
Reference Point Session Dosage Given: 1.8 Gy
Session Number: 5

## 2021-10-22 ENCOUNTER — Ambulatory Visit
Admission: RE | Admit: 2021-10-22 | Discharge: 2021-10-22 | Disposition: A | Discharge status: Home / Self Care | Payer: Medicare Other | Source: Ambulatory Visit | Attending: Radiation Oncology | Admitting: Radiation Oncology

## 2021-10-22 ENCOUNTER — Other Ambulatory Visit: Payer: Self-pay

## 2021-10-22 DIAGNOSIS — Z510 Encounter for antineoplastic radiation therapy: Secondary | ICD-10-CM | POA: Diagnosis not present

## 2021-10-22 LAB — RAD ONC ARIA SESSION SUMMARY
Course Elapsed Days: 8
Plan Fractions Treated to Date: 6
Plan Prescribed Dose Per Fraction: 1.8 Gy
Plan Total Fractions Prescribed: 25
Plan Total Prescribed Dose: 45 Gy
Reference Point Dosage Given to Date: 10.8 Gy
Reference Point Session Dosage Given: 1.8 Gy
Session Number: 6

## 2021-10-23 ENCOUNTER — Other Ambulatory Visit: Payer: Self-pay

## 2021-10-23 ENCOUNTER — Ambulatory Visit
Admission: RE | Admit: 2021-10-23 | Discharge: 2021-10-23 | Disposition: A | Discharge status: Home / Self Care | Payer: Medicare Other | Source: Ambulatory Visit | Attending: Radiation Oncology | Admitting: Radiation Oncology

## 2021-10-23 DIAGNOSIS — Z510 Encounter for antineoplastic radiation therapy: Secondary | ICD-10-CM | POA: Diagnosis not present

## 2021-10-23 LAB — RAD ONC ARIA SESSION SUMMARY
Course Elapsed Days: 9
Plan Fractions Treated to Date: 7
Plan Prescribed Dose Per Fraction: 1.8 Gy
Plan Total Fractions Prescribed: 25
Plan Total Prescribed Dose: 45 Gy
Reference Point Dosage Given to Date: 12.6 Gy
Reference Point Session Dosage Given: 1.8 Gy
Session Number: 7

## 2021-10-26 ENCOUNTER — Ambulatory Visit: Payer: Medicare Other

## 2021-10-27 ENCOUNTER — Ambulatory Visit
Admission: RE | Admit: 2021-10-27 | Discharge: 2021-10-27 | Disposition: A | Discharge status: Home / Self Care | Payer: Medicare Other | Source: Ambulatory Visit | Attending: Radiation Oncology | Admitting: Radiation Oncology

## 2021-10-27 ENCOUNTER — Other Ambulatory Visit: Payer: Self-pay

## 2021-10-27 DIAGNOSIS — Z510 Encounter for antineoplastic radiation therapy: Secondary | ICD-10-CM | POA: Diagnosis not present

## 2021-10-27 LAB — RAD ONC ARIA SESSION SUMMARY
Course Elapsed Days: 13
Plan Fractions Treated to Date: 8
Plan Prescribed Dose Per Fraction: 1.8 Gy
Plan Total Fractions Prescribed: 25
Plan Total Prescribed Dose: 45 Gy
Reference Point Dosage Given to Date: 14.4 Gy
Reference Point Session Dosage Given: 1.8 Gy
Session Number: 8

## 2021-10-27 NOTE — Progress Notes (Signed)
Pt called RN reporting some diarrhea over the weekend; 3-4 times daily typically after eating a meal with one episode of bright red blood in stool/toilet.    Pt is s/p RALP from 05/2021 and is currently under active radiation treatment to the prostate fossa.    RN reviewed symptoms with Dr. Tammi Klippel.  Dr. Tammi Klippel reviewed and recommendations for patient to continue with treatment.  Provided education on bleeding being related to trauma vs radiation due to increase in recent diarrhea, and typically treatment is not held based on this symptoms because the rectal lining returns to normal 2 weeks after completion.   RN relayed information to patient, verbalized understanding and agreement.  No further needs at this time.

## 2021-10-28 ENCOUNTER — Other Ambulatory Visit: Payer: Self-pay

## 2021-10-28 ENCOUNTER — Ambulatory Visit
Admission: RE | Admit: 2021-10-28 | Discharge: 2021-10-28 | Disposition: A | Discharge status: Home / Self Care | Payer: Medicare Other | Source: Ambulatory Visit | Attending: Radiation Oncology | Admitting: Radiation Oncology

## 2021-10-28 DIAGNOSIS — Z510 Encounter for antineoplastic radiation therapy: Secondary | ICD-10-CM | POA: Diagnosis not present

## 2021-10-28 LAB — RAD ONC ARIA SESSION SUMMARY
Course Elapsed Days: 14
Plan Fractions Treated to Date: 9
Plan Prescribed Dose Per Fraction: 1.8 Gy
Plan Total Fractions Prescribed: 25
Plan Total Prescribed Dose: 45 Gy
Reference Point Dosage Given to Date: 16.2 Gy
Reference Point Session Dosage Given: 1.8 Gy
Session Number: 9

## 2021-10-29 ENCOUNTER — Other Ambulatory Visit: Payer: Self-pay

## 2021-10-29 ENCOUNTER — Ambulatory Visit
Admission: RE | Admit: 2021-10-29 | Discharge: 2021-10-29 | Disposition: A | Discharge status: Home / Self Care | Payer: Medicare Other | Source: Ambulatory Visit | Attending: Radiation Oncology | Admitting: Radiation Oncology

## 2021-10-29 DIAGNOSIS — Z510 Encounter for antineoplastic radiation therapy: Secondary | ICD-10-CM | POA: Diagnosis not present

## 2021-10-29 LAB — RAD ONC ARIA SESSION SUMMARY
Course Elapsed Days: 15
Plan Fractions Treated to Date: 10
Plan Prescribed Dose Per Fraction: 1.8 Gy
Plan Total Fractions Prescribed: 25
Plan Total Prescribed Dose: 45 Gy
Reference Point Dosage Given to Date: 18 Gy
Reference Point Session Dosage Given: 1.8 Gy
Session Number: 10

## 2021-10-30 ENCOUNTER — Ambulatory Visit
Admission: RE | Admit: 2021-10-30 | Discharge: 2021-10-30 | Disposition: A | Discharge status: Home / Self Care | Payer: Medicare Other | Source: Ambulatory Visit | Attending: Radiation Oncology | Admitting: Radiation Oncology

## 2021-10-30 ENCOUNTER — Other Ambulatory Visit: Payer: Self-pay

## 2021-10-30 DIAGNOSIS — Z510 Encounter for antineoplastic radiation therapy: Secondary | ICD-10-CM | POA: Diagnosis not present

## 2021-10-30 LAB — RAD ONC ARIA SESSION SUMMARY
Course Elapsed Days: 16
Plan Fractions Treated to Date: 11
Plan Prescribed Dose Per Fraction: 1.8 Gy
Plan Total Fractions Prescribed: 25
Plan Total Prescribed Dose: 45 Gy
Reference Point Dosage Given to Date: 19.8 Gy
Reference Point Session Dosage Given: 1.8 Gy
Session Number: 11

## 2021-11-02 ENCOUNTER — Ambulatory Visit
Admission: RE | Admit: 2021-11-02 | Discharge: 2021-11-02 | Disposition: A | Discharge status: Home / Self Care | Payer: Medicare Other | Source: Ambulatory Visit | Attending: Radiation Oncology | Admitting: Radiation Oncology

## 2021-11-02 ENCOUNTER — Other Ambulatory Visit: Payer: Self-pay

## 2021-11-02 DIAGNOSIS — Z510 Encounter for antineoplastic radiation therapy: Secondary | ICD-10-CM | POA: Diagnosis not present

## 2021-11-02 LAB — RAD ONC ARIA SESSION SUMMARY
Course Elapsed Days: 19
Plan Fractions Treated to Date: 12
Plan Prescribed Dose Per Fraction: 1.8 Gy
Plan Total Fractions Prescribed: 25
Plan Total Prescribed Dose: 45 Gy
Reference Point Dosage Given to Date: 21.6 Gy
Reference Point Session Dosage Given: 1.8 Gy
Session Number: 12

## 2021-11-03 ENCOUNTER — Ambulatory Visit
Admission: RE | Admit: 2021-11-03 | Discharge: 2021-11-03 | Disposition: A | Discharge status: Home / Self Care | Payer: Medicare Other | Source: Ambulatory Visit | Attending: Radiation Oncology | Admitting: Radiation Oncology

## 2021-11-03 ENCOUNTER — Other Ambulatory Visit: Payer: Self-pay

## 2021-11-03 DIAGNOSIS — Z510 Encounter for antineoplastic radiation therapy: Secondary | ICD-10-CM | POA: Diagnosis not present

## 2021-11-03 LAB — RAD ONC ARIA SESSION SUMMARY
Course Elapsed Days: 20
Plan Fractions Treated to Date: 13
Plan Prescribed Dose Per Fraction: 1.8 Gy
Plan Total Fractions Prescribed: 25
Plan Total Prescribed Dose: 45 Gy
Reference Point Dosage Given to Date: 23.4 Gy
Reference Point Session Dosage Given: 1.8 Gy
Session Number: 13

## 2021-11-04 ENCOUNTER — Other Ambulatory Visit: Payer: Self-pay

## 2021-11-04 ENCOUNTER — Ambulatory Visit
Admission: RE | Admit: 2021-11-04 | Discharge: 2021-11-04 | Disposition: A | Discharge status: Home / Self Care | Payer: Medicare Other | Source: Ambulatory Visit | Attending: Radiation Oncology | Admitting: Radiation Oncology

## 2021-11-04 DIAGNOSIS — Z510 Encounter for antineoplastic radiation therapy: Secondary | ICD-10-CM | POA: Diagnosis not present

## 2021-11-04 LAB — RAD ONC ARIA SESSION SUMMARY
Course Elapsed Days: 21
Plan Fractions Treated to Date: 14
Plan Prescribed Dose Per Fraction: 1.8 Gy
Plan Total Fractions Prescribed: 25
Plan Total Prescribed Dose: 45 Gy
Reference Point Dosage Given to Date: 25.2 Gy
Reference Point Session Dosage Given: 1.8 Gy
Session Number: 14

## 2021-11-05 ENCOUNTER — Ambulatory Visit
Admission: RE | Admit: 2021-11-05 | Discharge: 2021-11-05 | Disposition: A | Discharge status: Home / Self Care | Payer: Medicare Other | Source: Ambulatory Visit | Attending: Radiation Oncology | Admitting: Radiation Oncology

## 2021-11-05 ENCOUNTER — Other Ambulatory Visit: Payer: Self-pay

## 2021-11-05 DIAGNOSIS — Z510 Encounter for antineoplastic radiation therapy: Secondary | ICD-10-CM | POA: Diagnosis not present

## 2021-11-05 LAB — RAD ONC ARIA SESSION SUMMARY
Course Elapsed Days: 22
Plan Fractions Treated to Date: 15
Plan Prescribed Dose Per Fraction: 1.8 Gy
Plan Total Fractions Prescribed: 25
Plan Total Prescribed Dose: 45 Gy
Reference Point Dosage Given to Date: 27 Gy
Reference Point Session Dosage Given: 1.8 Gy
Session Number: 15

## 2021-11-06 ENCOUNTER — Ambulatory Visit
Admission: RE | Admit: 2021-11-06 | Discharge: 2021-11-06 | Disposition: A | Discharge status: Home / Self Care | Payer: Medicare Other | Source: Ambulatory Visit | Attending: Radiation Oncology | Admitting: Radiation Oncology

## 2021-11-06 ENCOUNTER — Other Ambulatory Visit: Payer: Self-pay

## 2021-11-06 DIAGNOSIS — Z510 Encounter for antineoplastic radiation therapy: Secondary | ICD-10-CM | POA: Diagnosis not present

## 2021-11-06 LAB — RAD ONC ARIA SESSION SUMMARY
Course Elapsed Days: 23
Plan Fractions Treated to Date: 16
Plan Prescribed Dose Per Fraction: 1.8 Gy
Plan Total Fractions Prescribed: 25
Plan Total Prescribed Dose: 45 Gy
Reference Point Dosage Given to Date: 28.8 Gy
Reference Point Session Dosage Given: 1.8 Gy
Session Number: 16

## 2021-11-09 ENCOUNTER — Other Ambulatory Visit: Payer: Self-pay

## 2021-11-09 ENCOUNTER — Ambulatory Visit
Admission: RE | Admit: 2021-11-09 | Discharge: 2021-11-09 | Disposition: A | Discharge status: Home / Self Care | Payer: Medicare Other | Source: Ambulatory Visit | Attending: Radiation Oncology | Admitting: Radiation Oncology

## 2021-11-09 DIAGNOSIS — Z510 Encounter for antineoplastic radiation therapy: Secondary | ICD-10-CM | POA: Diagnosis not present

## 2021-11-09 LAB — RAD ONC ARIA SESSION SUMMARY
Course Elapsed Days: 26
Plan Fractions Treated to Date: 17
Plan Prescribed Dose Per Fraction: 1.8 Gy
Plan Total Fractions Prescribed: 25
Plan Total Prescribed Dose: 45 Gy
Reference Point Dosage Given to Date: 30.6 Gy
Reference Point Session Dosage Given: 1.8 Gy
Session Number: 17

## 2021-11-10 ENCOUNTER — Ambulatory Visit
Admission: RE | Admit: 2021-11-10 | Discharge: 2021-11-10 | Disposition: A | Discharge status: Home / Self Care | Payer: Medicare Other | Source: Ambulatory Visit | Attending: Radiation Oncology | Admitting: Radiation Oncology

## 2021-11-10 ENCOUNTER — Other Ambulatory Visit: Payer: Self-pay

## 2021-11-10 DIAGNOSIS — Z510 Encounter for antineoplastic radiation therapy: Secondary | ICD-10-CM | POA: Diagnosis not present

## 2021-11-10 LAB — RAD ONC ARIA SESSION SUMMARY
Course Elapsed Days: 27
Plan Fractions Treated to Date: 18
Plan Prescribed Dose Per Fraction: 1.8 Gy
Plan Total Fractions Prescribed: 25
Plan Total Prescribed Dose: 45 Gy
Reference Point Dosage Given to Date: 32.4 Gy
Reference Point Session Dosage Given: 1.8 Gy
Session Number: 18

## 2021-11-11 ENCOUNTER — Ambulatory Visit
Admission: RE | Admit: 2021-11-11 | Discharge: 2021-11-11 | Disposition: A | Discharge status: Home / Self Care | Payer: Medicare Other | Source: Ambulatory Visit | Attending: Radiation Oncology | Admitting: Radiation Oncology

## 2021-11-11 ENCOUNTER — Other Ambulatory Visit: Payer: Self-pay

## 2021-11-11 DIAGNOSIS — Z510 Encounter for antineoplastic radiation therapy: Secondary | ICD-10-CM | POA: Diagnosis not present

## 2021-11-11 LAB — RAD ONC ARIA SESSION SUMMARY
Course Elapsed Days: 28
Plan Fractions Treated to Date: 19
Plan Prescribed Dose Per Fraction: 1.8 Gy
Plan Total Fractions Prescribed: 25
Plan Total Prescribed Dose: 45 Gy
Reference Point Dosage Given to Date: 34.2 Gy
Reference Point Session Dosage Given: 1.8 Gy
Session Number: 19

## 2021-11-12 ENCOUNTER — Other Ambulatory Visit: Payer: Self-pay

## 2021-11-12 ENCOUNTER — Ambulatory Visit
Admission: RE | Admit: 2021-11-12 | Discharge: 2021-11-12 | Disposition: A | Discharge status: Home / Self Care | Payer: Medicare Other | Source: Ambulatory Visit | Attending: Radiation Oncology | Admitting: Radiation Oncology

## 2021-11-12 DIAGNOSIS — Z510 Encounter for antineoplastic radiation therapy: Secondary | ICD-10-CM | POA: Diagnosis not present

## 2021-11-12 LAB — RAD ONC ARIA SESSION SUMMARY
Course Elapsed Days: 29
Plan Fractions Treated to Date: 20
Plan Prescribed Dose Per Fraction: 1.8 Gy
Plan Total Fractions Prescribed: 25
Plan Total Prescribed Dose: 45 Gy
Reference Point Dosage Given to Date: 36 Gy
Reference Point Session Dosage Given: 1.8 Gy
Session Number: 20

## 2021-11-13 ENCOUNTER — Other Ambulatory Visit: Payer: Self-pay

## 2021-11-13 ENCOUNTER — Ambulatory Visit
Admission: RE | Admit: 2021-11-13 | Discharge: 2021-11-13 | Disposition: A | Discharge status: Home / Self Care | Payer: Medicare Other | Source: Ambulatory Visit | Attending: Radiation Oncology | Admitting: Radiation Oncology

## 2021-11-13 DIAGNOSIS — Z510 Encounter for antineoplastic radiation therapy: Secondary | ICD-10-CM | POA: Diagnosis not present

## 2021-11-13 LAB — RAD ONC ARIA SESSION SUMMARY
Course Elapsed Days: 30
Plan Fractions Treated to Date: 21
Plan Prescribed Dose Per Fraction: 1.8 Gy
Plan Total Fractions Prescribed: 25
Plan Total Prescribed Dose: 45 Gy
Reference Point Dosage Given to Date: 37.8 Gy
Reference Point Session Dosage Given: 1.8 Gy
Session Number: 21

## 2021-11-16 ENCOUNTER — Ambulatory Visit: Payer: Medicare Other

## 2021-11-17 ENCOUNTER — Ambulatory Visit
Admission: RE | Admit: 2021-11-17 | Discharge: 2021-11-17 | Disposition: A | Discharge status: Home / Self Care | Payer: Medicare Other | Source: Ambulatory Visit | Attending: Radiation Oncology | Admitting: Radiation Oncology

## 2021-11-17 ENCOUNTER — Other Ambulatory Visit: Payer: Self-pay

## 2021-11-17 DIAGNOSIS — C61 Malignant neoplasm of prostate: Secondary | ICD-10-CM | POA: Diagnosis present

## 2021-11-17 DIAGNOSIS — Z510 Encounter for antineoplastic radiation therapy: Secondary | ICD-10-CM | POA: Diagnosis present

## 2021-11-17 LAB — RAD ONC ARIA SESSION SUMMARY
Course Elapsed Days: 34
Plan Fractions Treated to Date: 22
Plan Prescribed Dose Per Fraction: 1.8 Gy
Plan Total Fractions Prescribed: 25
Plan Total Prescribed Dose: 45 Gy
Reference Point Dosage Given to Date: 39.6 Gy
Reference Point Session Dosage Given: 1.8 Gy
Session Number: 22

## 2021-11-18 ENCOUNTER — Ambulatory Visit
Admission: RE | Admit: 2021-11-18 | Discharge: 2021-11-18 | Disposition: A | Discharge status: Home / Self Care | Payer: Medicare Other | Source: Ambulatory Visit | Attending: Radiation Oncology | Admitting: Radiation Oncology

## 2021-11-18 ENCOUNTER — Other Ambulatory Visit: Payer: Self-pay

## 2021-11-18 DIAGNOSIS — Z510 Encounter for antineoplastic radiation therapy: Secondary | ICD-10-CM | POA: Diagnosis not present

## 2021-11-18 LAB — RAD ONC ARIA SESSION SUMMARY
Course Elapsed Days: 35
Plan Fractions Treated to Date: 23
Plan Prescribed Dose Per Fraction: 1.8 Gy
Plan Total Fractions Prescribed: 25
Plan Total Prescribed Dose: 45 Gy
Reference Point Dosage Given to Date: 41.4 Gy
Reference Point Session Dosage Given: 1.8 Gy
Session Number: 23

## 2021-11-19 ENCOUNTER — Other Ambulatory Visit: Payer: Self-pay

## 2021-11-19 ENCOUNTER — Ambulatory Visit
Admission: RE | Admit: 2021-11-19 | Discharge: 2021-11-19 | Disposition: A | Discharge status: Home / Self Care | Payer: Medicare Other | Source: Ambulatory Visit | Attending: Radiation Oncology | Admitting: Radiation Oncology

## 2021-11-19 DIAGNOSIS — Z510 Encounter for antineoplastic radiation therapy: Secondary | ICD-10-CM | POA: Diagnosis not present

## 2021-11-19 LAB — RAD ONC ARIA SESSION SUMMARY
Course Elapsed Days: 36
Plan Fractions Treated to Date: 24
Plan Prescribed Dose Per Fraction: 1.8 Gy
Plan Total Fractions Prescribed: 25
Plan Total Prescribed Dose: 45 Gy
Reference Point Dosage Given to Date: 43.2 Gy
Reference Point Session Dosage Given: 1.8 Gy
Session Number: 24

## 2021-11-20 ENCOUNTER — Ambulatory Visit
Admission: RE | Admit: 2021-11-20 | Discharge: 2021-11-20 | Disposition: A | Discharge status: Home / Self Care | Payer: Medicare Other | Source: Ambulatory Visit | Attending: Radiation Oncology | Admitting: Radiation Oncology

## 2021-11-20 ENCOUNTER — Other Ambulatory Visit: Payer: Self-pay

## 2021-11-20 DIAGNOSIS — Z510 Encounter for antineoplastic radiation therapy: Secondary | ICD-10-CM | POA: Diagnosis not present

## 2021-11-20 LAB — RAD ONC ARIA SESSION SUMMARY
Course Elapsed Days: 37
Plan Fractions Treated to Date: 25
Plan Prescribed Dose Per Fraction: 1.8 Gy
Plan Total Fractions Prescribed: 25
Plan Total Prescribed Dose: 45 Gy
Reference Point Dosage Given to Date: 45 Gy
Reference Point Session Dosage Given: 1.8 Gy
Session Number: 25

## 2021-11-23 ENCOUNTER — Other Ambulatory Visit: Payer: Self-pay

## 2021-11-23 ENCOUNTER — Ambulatory Visit
Admission: RE | Admit: 2021-11-23 | Discharge: 2021-11-23 | Disposition: A | Discharge status: Home / Self Care | Payer: Medicare Other | Source: Ambulatory Visit | Attending: Radiation Oncology | Admitting: Radiation Oncology

## 2021-11-23 DIAGNOSIS — Z510 Encounter for antineoplastic radiation therapy: Secondary | ICD-10-CM | POA: Diagnosis not present

## 2021-11-23 LAB — RAD ONC ARIA SESSION SUMMARY
Course Elapsed Days: 40
Plan Fractions Treated to Date: 1
Plan Prescribed Dose Per Fraction: 1.8 Gy
Plan Total Fractions Prescribed: 13
Plan Total Prescribed Dose: 23.4 Gy
Reference Point Dosage Given to Date: 46.8 Gy
Reference Point Session Dosage Given: 1.8 Gy
Session Number: 26

## 2021-11-24 ENCOUNTER — Other Ambulatory Visit: Payer: Self-pay

## 2021-11-24 ENCOUNTER — Ambulatory Visit
Admission: RE | Admit: 2021-11-24 | Discharge: 2021-11-24 | Disposition: A | Discharge status: Home / Self Care | Payer: Medicare Other | Source: Ambulatory Visit | Attending: Radiation Oncology | Admitting: Radiation Oncology

## 2021-11-24 DIAGNOSIS — Z510 Encounter for antineoplastic radiation therapy: Secondary | ICD-10-CM | POA: Diagnosis not present

## 2021-11-24 LAB — RAD ONC ARIA SESSION SUMMARY
Course Elapsed Days: 41
Plan Fractions Treated to Date: 2
Plan Prescribed Dose Per Fraction: 1.8 Gy
Plan Total Fractions Prescribed: 13
Plan Total Prescribed Dose: 23.4 Gy
Reference Point Dosage Given to Date: 48.6 Gy
Reference Point Session Dosage Given: 1.8 Gy
Session Number: 27

## 2021-11-25 ENCOUNTER — Other Ambulatory Visit: Payer: Self-pay

## 2021-11-25 ENCOUNTER — Ambulatory Visit
Admission: RE | Admit: 2021-11-25 | Discharge: 2021-11-25 | Disposition: A | Discharge status: Home / Self Care | Payer: Medicare Other | Source: Ambulatory Visit | Attending: Radiation Oncology | Admitting: Radiation Oncology

## 2021-11-25 DIAGNOSIS — Z510 Encounter for antineoplastic radiation therapy: Secondary | ICD-10-CM | POA: Diagnosis not present

## 2021-11-25 LAB — RAD ONC ARIA SESSION SUMMARY
Course Elapsed Days: 42
Plan Fractions Treated to Date: 3
Plan Prescribed Dose Per Fraction: 1.8 Gy
Plan Total Fractions Prescribed: 13
Plan Total Prescribed Dose: 23.4 Gy
Reference Point Dosage Given to Date: 50.4 Gy
Reference Point Session Dosage Given: 1.8 Gy
Session Number: 28

## 2021-11-26 ENCOUNTER — Other Ambulatory Visit: Payer: Self-pay

## 2021-11-26 ENCOUNTER — Ambulatory Visit
Admission: RE | Admit: 2021-11-26 | Discharge: 2021-11-26 | Disposition: A | Discharge status: Home / Self Care | Payer: Medicare Other | Source: Ambulatory Visit | Attending: Radiation Oncology | Admitting: Radiation Oncology

## 2021-11-26 DIAGNOSIS — Z510 Encounter for antineoplastic radiation therapy: Secondary | ICD-10-CM | POA: Diagnosis not present

## 2021-11-26 LAB — RAD ONC ARIA SESSION SUMMARY
Course Elapsed Days: 43
Plan Fractions Treated to Date: 4
Plan Prescribed Dose Per Fraction: 1.8 Gy
Plan Total Fractions Prescribed: 13
Plan Total Prescribed Dose: 23.4 Gy
Reference Point Dosage Given to Date: 52.2 Gy
Reference Point Session Dosage Given: 1.8 Gy
Session Number: 29

## 2021-11-27 ENCOUNTER — Ambulatory Visit
Admission: RE | Admit: 2021-11-27 | Discharge: 2021-11-27 | Disposition: A | Discharge status: Home / Self Care | Payer: Medicare Other | Source: Ambulatory Visit | Attending: Radiation Oncology | Admitting: Radiation Oncology

## 2021-11-27 ENCOUNTER — Other Ambulatory Visit: Payer: Self-pay

## 2021-11-27 DIAGNOSIS — Z510 Encounter for antineoplastic radiation therapy: Secondary | ICD-10-CM | POA: Diagnosis not present

## 2021-11-27 LAB — RAD ONC ARIA SESSION SUMMARY
Course Elapsed Days: 44
Plan Fractions Treated to Date: 5
Plan Prescribed Dose Per Fraction: 1.8 Gy
Plan Total Fractions Prescribed: 13
Plan Total Prescribed Dose: 23.4 Gy
Reference Point Dosage Given to Date: 54 Gy
Reference Point Session Dosage Given: 1.8 Gy
Session Number: 30

## 2021-11-30 ENCOUNTER — Other Ambulatory Visit: Payer: Self-pay

## 2021-11-30 ENCOUNTER — Ambulatory Visit
Admission: RE | Admit: 2021-11-30 | Discharge: 2021-11-30 | Disposition: A | Discharge status: Home / Self Care | Payer: Medicare Other | Source: Ambulatory Visit | Attending: Radiation Oncology | Admitting: Radiation Oncology

## 2021-11-30 DIAGNOSIS — Z510 Encounter for antineoplastic radiation therapy: Secondary | ICD-10-CM | POA: Diagnosis not present

## 2021-11-30 LAB — RAD ONC ARIA SESSION SUMMARY
Course Elapsed Days: 47
Plan Fractions Treated to Date: 6
Plan Prescribed Dose Per Fraction: 1.8 Gy
Plan Total Fractions Prescribed: 13
Plan Total Prescribed Dose: 23.4 Gy
Reference Point Dosage Given to Date: 55.8 Gy
Reference Point Session Dosage Given: 1.8 Gy
Session Number: 31

## 2021-12-01 ENCOUNTER — Other Ambulatory Visit: Payer: Self-pay

## 2021-12-01 ENCOUNTER — Ambulatory Visit
Admission: RE | Admit: 2021-12-01 | Discharge: 2021-12-01 | Disposition: A | Discharge status: Home / Self Care | Payer: Medicare Other | Source: Ambulatory Visit | Attending: Radiation Oncology | Admitting: Radiation Oncology

## 2021-12-01 DIAGNOSIS — Z510 Encounter for antineoplastic radiation therapy: Secondary | ICD-10-CM | POA: Diagnosis not present

## 2021-12-01 LAB — RAD ONC ARIA SESSION SUMMARY
Course Elapsed Days: 48
Plan Fractions Treated to Date: 7
Plan Prescribed Dose Per Fraction: 1.8 Gy
Plan Total Fractions Prescribed: 13
Plan Total Prescribed Dose: 23.4 Gy
Reference Point Dosage Given to Date: 57.6 Gy
Reference Point Session Dosage Given: 1.8 Gy
Session Number: 32

## 2021-12-02 ENCOUNTER — Other Ambulatory Visit: Payer: Self-pay

## 2021-12-02 ENCOUNTER — Ambulatory Visit
Admission: RE | Admit: 2021-12-02 | Discharge: 2021-12-02 | Disposition: A | Discharge status: Home / Self Care | Payer: Medicare Other | Source: Ambulatory Visit | Attending: Radiation Oncology | Admitting: Radiation Oncology

## 2021-12-02 DIAGNOSIS — Z510 Encounter for antineoplastic radiation therapy: Secondary | ICD-10-CM | POA: Diagnosis not present

## 2021-12-02 LAB — RAD ONC ARIA SESSION SUMMARY
Course Elapsed Days: 49
Plan Fractions Treated to Date: 8
Plan Prescribed Dose Per Fraction: 1.8 Gy
Plan Total Fractions Prescribed: 13
Plan Total Prescribed Dose: 23.4 Gy
Reference Point Dosage Given to Date: 59.4 Gy
Reference Point Session Dosage Given: 1.8 Gy
Session Number: 33

## 2021-12-03 ENCOUNTER — Ambulatory Visit: Payer: Medicare Other

## 2021-12-04 ENCOUNTER — Other Ambulatory Visit: Payer: Self-pay

## 2021-12-04 ENCOUNTER — Ambulatory Visit
Admission: RE | Admit: 2021-12-04 | Discharge: 2021-12-04 | Disposition: A | Discharge status: Home / Self Care | Payer: Medicare Other | Source: Ambulatory Visit | Attending: Radiation Oncology | Admitting: Radiation Oncology

## 2021-12-04 DIAGNOSIS — Z510 Encounter for antineoplastic radiation therapy: Secondary | ICD-10-CM | POA: Diagnosis not present

## 2021-12-04 LAB — RAD ONC ARIA SESSION SUMMARY
Course Elapsed Days: 51
Plan Fractions Treated to Date: 9
Plan Prescribed Dose Per Fraction: 1.8 Gy
Plan Total Fractions Prescribed: 13
Plan Total Prescribed Dose: 23.4 Gy
Reference Point Dosage Given to Date: 61.2 Gy
Reference Point Session Dosage Given: 1.8 Gy
Session Number: 34

## 2021-12-07 ENCOUNTER — Ambulatory Visit
Admission: RE | Admit: 2021-12-07 | Discharge: 2021-12-07 | Disposition: A | Discharge status: Home / Self Care | Payer: Medicare Other | Source: Ambulatory Visit | Attending: Radiation Oncology | Admitting: Radiation Oncology

## 2021-12-07 ENCOUNTER — Other Ambulatory Visit: Payer: Self-pay

## 2021-12-07 ENCOUNTER — Ambulatory Visit: Payer: Medicare Other

## 2021-12-07 DIAGNOSIS — Z510 Encounter for antineoplastic radiation therapy: Secondary | ICD-10-CM | POA: Diagnosis not present

## 2021-12-07 LAB — RAD ONC ARIA SESSION SUMMARY
Course Elapsed Days: 54
Plan Fractions Treated to Date: 10
Plan Prescribed Dose Per Fraction: 1.8 Gy
Plan Total Fractions Prescribed: 13
Plan Total Prescribed Dose: 23.4 Gy
Reference Point Dosage Given to Date: 63 Gy
Reference Point Session Dosage Given: 1.8 Gy
Session Number: 35

## 2021-12-08 ENCOUNTER — Other Ambulatory Visit: Payer: Self-pay

## 2021-12-08 ENCOUNTER — Ambulatory Visit
Admission: RE | Admit: 2021-12-08 | Discharge: 2021-12-08 | Disposition: A | Discharge status: Home / Self Care | Payer: Medicare Other | Source: Ambulatory Visit | Attending: Radiation Oncology | Admitting: Radiation Oncology

## 2021-12-08 ENCOUNTER — Ambulatory Visit: Payer: Medicare Other

## 2021-12-08 DIAGNOSIS — Z510 Encounter for antineoplastic radiation therapy: Secondary | ICD-10-CM | POA: Diagnosis not present

## 2021-12-08 LAB — RAD ONC ARIA SESSION SUMMARY
Course Elapsed Days: 55
Plan Fractions Treated to Date: 11
Plan Prescribed Dose Per Fraction: 1.8 Gy
Plan Total Fractions Prescribed: 13
Plan Total Prescribed Dose: 23.4 Gy
Reference Point Dosage Given to Date: 64.8 Gy
Reference Point Session Dosage Given: 1.8 Gy
Session Number: 36

## 2021-12-09 ENCOUNTER — Other Ambulatory Visit: Payer: Self-pay

## 2021-12-09 ENCOUNTER — Ambulatory Visit
Admission: RE | Admit: 2021-12-09 | Discharge: 2021-12-09 | Disposition: A | Discharge status: Home / Self Care | Payer: Medicare Other | Source: Ambulatory Visit | Attending: Radiation Oncology | Admitting: Radiation Oncology

## 2021-12-09 ENCOUNTER — Ambulatory Visit: Payer: Medicare Other

## 2021-12-09 DIAGNOSIS — Z510 Encounter for antineoplastic radiation therapy: Secondary | ICD-10-CM | POA: Diagnosis not present

## 2021-12-09 LAB — RAD ONC ARIA SESSION SUMMARY
Course Elapsed Days: 56
Plan Fractions Treated to Date: 12
Plan Prescribed Dose Per Fraction: 1.8 Gy
Plan Total Fractions Prescribed: 13
Plan Total Prescribed Dose: 23.4 Gy
Reference Point Dosage Given to Date: 66.6 Gy
Reference Point Session Dosage Given: 1.8 Gy
Session Number: 37

## 2021-12-10 ENCOUNTER — Other Ambulatory Visit: Payer: Self-pay

## 2021-12-10 ENCOUNTER — Ambulatory Visit: Payer: Medicare Other

## 2021-12-10 ENCOUNTER — Ambulatory Visit
Admission: RE | Admit: 2021-12-10 | Discharge: 2021-12-10 | Disposition: A | Discharge status: Home / Self Care | Payer: Medicare Other | Source: Ambulatory Visit | Attending: Radiation Oncology | Admitting: Radiation Oncology

## 2021-12-10 ENCOUNTER — Encounter: Payer: Self-pay | Admitting: Urology

## 2021-12-10 DIAGNOSIS — C61 Malignant neoplasm of prostate: Secondary | ICD-10-CM

## 2021-12-10 DIAGNOSIS — Z510 Encounter for antineoplastic radiation therapy: Secondary | ICD-10-CM | POA: Diagnosis not present

## 2021-12-10 LAB — RAD ONC ARIA SESSION SUMMARY
Course Elapsed Days: 57
Plan Fractions Treated to Date: 13
Plan Prescribed Dose Per Fraction: 1.8 Gy
Plan Total Fractions Prescribed: 13
Plan Total Prescribed Dose: 23.4 Gy
Reference Point Dosage Given to Date: 68.4 Gy
Reference Point Session Dosage Given: 1.8 Gy
Session Number: 38

## 2022-01-26 ENCOUNTER — Encounter: Payer: Self-pay | Admitting: Urology

## 2022-01-26 NOTE — Progress Notes (Signed)
Telephone nursing appointment for Malignant neoplasm of prostate. I verified patient's identity and began nursing interview. Patient reports Fatigue, diarrhea (Imodium helping slightly), and lower central ABD pain 8/10, on occasion upon standing. No other issues reported at this time.   Meaningful use complete. I-PSS score of 4-mild. No urinary management medications. Urology appt- Jan 2nd, 2023 At [facility] w/ Dr. Alinda Money.   Patient aware of their 10:30am-01/27/22 telephone appointment w/ Ashlyn Bruning PA-C. I left my extension [phone] in case patient needs anything. Patient verbalized understanding. This concludes the nursing interview.   Patient contact [phone]     Leandra Kern, LPN

## 2022-01-27 ENCOUNTER — Ambulatory Visit
Admission: RE | Admit: 2022-01-27 | Discharge: 2022-01-27 | Disposition: A | Discharge status: Home / Self Care | Payer: Medicare Other | Source: Ambulatory Visit | Attending: Urology | Admitting: Urology

## 2022-01-27 DIAGNOSIS — C61 Malignant neoplasm of prostate: Secondary | ICD-10-CM

## 2022-01-27 NOTE — Progress Notes (Signed)
Radiation Oncology         (336) [phone] ________________________________  Name: Joud Ingwersen MRN: [id number]  Date: 01/27/2022  DOB: 1951/02/06  Post Treatment Note  CC: Aletha Halim., PA-C  Aletha Halim., PA-C  Diagnosis:   71 y.o. gentleman with a postoperative PSA of 0.32 s/p RALP 05/2021 for Stage pT3bN1, Gleason 5+4 prostate cancer   Interval Since Last Radiation:  7 weeks   10/14/21 - 12/10/21:  1. The prostate fossa and pelvic lymph nodes were initially treated to 45 Gy in 25 fractions of 1.8 Gy  2. The prostate fossa only was boosted to 68.4 Gy with 13 additional fractions of 1.8 Gy   Narrative:  I spoke with the patient to conduct his routine scheduled 1 month follow up visit via telephone to spare the patient unnecessary potential exposure in the healthcare setting during the current COVID-19 pandemic.  The patient was notified in advance and gave permission to proceed with this visit format.  He tolerated radiation treatment relatively well with only minor urinary irritation with increased nocturia and modest fatigue.  He did report diarrhea that was improved with Imodium as needed                               On review of systems, the patient states that he is doing well in general. He has had some intermittent lower abdominal pains with activities, particularly when standing from seated or getting out of bed in the mornings. He also continues with intermittent diarrhea that responds to Imodium but is using almost on a daily basis.  He reports a healthy appetite and is maintaining his weight.  His LUTS have pretty much returned to baseline at this point and he specifically denies dysuria, gross hematuria, straining to void, incomplete bladder emptying or incontinence.  He continues with decreased stamina but overall, is pleased with his progress to date.  He is scheduled for a follow-up visit with Dr. Alinda Money on April 20, 2022.  ALLERGIES:  has No Known  Allergies.  Meds: Current Outpatient Medications  Medication Sig Dispense Refill   amLODipine (NORVASC) 10 MG tablet Take 10 mg by mouth daily.     Cetirizine HCl (ZYRTEC ALLERGY PO) Take 10 mg by mouth daily.     Docusate Sodium (DSS) 100 MG CAPS Take by mouth.     doxycycline (VIBRAMYCIN) 100 MG capsule Take by mouth.     famotidine (PEPCID) 10 MG tablet Take 10 mg by mouth 2 (two) times daily.     metroNIDAZOLE (METROGEL) 0.75 % gel Apply topically daily.     omeprazole (PRILOSEC) 40 MG capsule Take 40 mg by mouth daily.     sildenafil (VIAGRA) 100 MG tablet SMARTSIG:1 Tablet(s) By Mouth As Directed PRN     No current facility-administered medications for this encounter.    Physical Findings:  vitals were not taken for this visit.  Pain Assessment Pain Score: 9  (Lower ABD pain)/10 Unable to assess due to telephone follow-up visit format.  Lab Findings: Lab Results  Component Value Date   WBC 8.2 05/25/2021   HGB 14.2 06/02/2021   HCT 41.5 06/02/2021   MCV 80.3 05/25/2021   PLT 195 05/25/2021     Radiographic Findings: No results found.  Impression/Plan: 1. 71 y.o. gentleman with a postoperative PSA of 0.32 s/p RALP 05/2021 for Stage pT3bN1, Gleason 5+4 prostate cancer. He will continue to follow up with urology  for ongoing PSA determinations and has an appointment scheduled with Dr. Alinda Money on 04/20/2022. He understands what to expect with regards to PSA monitoring going forward. I will look forward to following his response to treatment via correspondence with urology, and would be happy to continue to participate in his care if clinically indicated. I talked to the patient about what to expect in the future, including his risk for erectile dysfunction and rectal bleeding.  He continues to have diarrhea on a regular basis which could indeed still be secondary to his recent radiation but unusual to linger around this far out from completing treatment.  I have advised him to  reach out to his PCP if this persists into next week just to ensure that there is not another underlying cause that should be treated.  I encouraged him to call or return to the office if he has any questions regarding his previous radiation or possible radiation side effects. He was comfortable with this plan and will follow up as needed.     Nicholos Johns, PA-C

## 2022-01-27 NOTE — Progress Notes (Signed)
  Radiation Oncology         [phone]) [phone] ________________________________  Name: Derek Cardenas MRN: [id number]  Date: 12/10/2021  DOB: 08-02-1950  End of Treatment Note  Diagnosis:   71 y.o. gentleman with a postoperative PSA of 0.32 s/p RALP 05/2021 for Stage pT3bN1, Gleason 5+4 prostate cancer      Indication for treatment:  Curative, Definitive Radiotherapy       Radiation treatment dates:   10/14/21 - 12/10/21  Site/dose:  1. The prostate fossa and pelvic lymph nodes were initially treated to 45 Gy in 25 fractions of 1.8 Gy  2. The prostate fossa only was boosted to 68.4 Gy with 13 additional fractions of 1.8 Gy   Beams/energy:  1. The prostate fossa  and pelvic lymph nodes were initially treated using VMAT intensity modulated radiotherapy delivering 6 megavolt photons. Image guidance was performed with CB-CT studies prior to each fraction. He was immobilized with a body fix lower extremity mold.  2. The prostate fossa only was boosted using VMAT intensity modulated radiotherapy delivering 6 megavolt photons. Image guidance was performed with CB-CT studies prior to each fraction. He was immobilized with a body fix lower extremity mold.  Narrative: The patient tolerated radiation treatment relatively well with only minor urinary irritation with increased nocturia and modest fatigue.  He did report diarrhea that was improved with Imodium as needed.  Plan: The patient has completed radiation treatment. He will return to radiation oncology clinic for routine followup in one month. I advised him to call or return sooner if he has any questions or concerns related to his recovery or treatment. ________________________________  Sheral Apley. Tammi Klippel, M.D.

## 2022-04-26 LAB — COLOGUARD: COLOGUARD: NEGATIVE

## 2022-05-05 ENCOUNTER — Encounter (HOSPITAL_BASED_OUTPATIENT_CLINIC_OR_DEPARTMENT_OTHER): Payer: Self-pay | Admitting: General Surgery

## 2022-05-05 ENCOUNTER — Ambulatory Visit: Payer: Self-pay | Admitting: General Surgery

## 2022-05-05 NOTE — Progress Notes (Signed)
Spoke w/ via phone for pre-op interview--- Tom Lab needs dos----  NONE             Lab results------Current EKG in Epic dated 05/2021 COVID test -----patient states asymptomatic no test needed Arrive at -------1130 NPO after MN NO Solid Food.  Clear liquids from MN until---1030 Med rec completed Medications to take morning of surgery -----Norvasc and Prilosec Diabetic medication ----- Patient instructed no nail polish to be worn day of surgery Patient instructed to bring photo id and insurance card day of surgery Patient aware to have Driver (ride ) / caregiver   wife Jyquan Kenley for 24 hours after surgery  Patient Special Instructions ----- Pre-Op special Istructions ----- Patient verbalized understanding of instructions that were given at this phone interview. Patient denies shortness of breath, chest pain, fever, cough at this phone interview.

## 2022-05-11 ENCOUNTER — Ambulatory Visit (HOSPITAL_BASED_OUTPATIENT_CLINIC_OR_DEPARTMENT_OTHER)
Admission: RE | Admit: 2022-05-11 | Discharge: 2022-05-11 | Disposition: A | Discharge status: Home / Self Care | Payer: Medicare Other | Attending: General Surgery | Admitting: General Surgery

## 2022-05-11 ENCOUNTER — Encounter (HOSPITAL_BASED_OUTPATIENT_CLINIC_OR_DEPARTMENT_OTHER): Payer: Self-pay | Admitting: General Surgery

## 2022-05-11 ENCOUNTER — Ambulatory Visit (HOSPITAL_BASED_OUTPATIENT_CLINIC_OR_DEPARTMENT_OTHER): Payer: Medicare Other | Admitting: Anesthesiology

## 2022-05-11 ENCOUNTER — Other Ambulatory Visit: Payer: Self-pay

## 2022-05-11 ENCOUNTER — Encounter (HOSPITAL_BASED_OUTPATIENT_CLINIC_OR_DEPARTMENT_OTHER)
Admission: RE | Disposition: A | Discharge status: Home / Self Care | Payer: Self-pay | Source: Home / Self Care | Attending: General Surgery

## 2022-05-11 DIAGNOSIS — K219 Gastro-esophageal reflux disease without esophagitis: Secondary | ICD-10-CM | POA: Insufficient documentation

## 2022-05-11 DIAGNOSIS — N289 Disorder of kidney and ureter, unspecified: Secondary | ICD-10-CM | POA: Diagnosis not present

## 2022-05-11 DIAGNOSIS — K4090 Unilateral inguinal hernia, without obstruction or gangrene, not specified as recurrent: Secondary | ICD-10-CM

## 2022-05-11 DIAGNOSIS — Z85528 Personal history of other malignant neoplasm of kidney: Secondary | ICD-10-CM | POA: Insufficient documentation

## 2022-05-11 DIAGNOSIS — I10 Essential (primary) hypertension: Secondary | ICD-10-CM | POA: Insufficient documentation

## 2022-05-11 DIAGNOSIS — Z905 Acquired absence of kidney: Secondary | ICD-10-CM | POA: Insufficient documentation

## 2022-05-11 DIAGNOSIS — Z79899 Other long term (current) drug therapy: Secondary | ICD-10-CM | POA: Diagnosis not present

## 2022-05-11 DIAGNOSIS — Z87891 Personal history of nicotine dependence: Secondary | ICD-10-CM

## 2022-05-11 HISTORY — PX: INGUINAL HERNIA REPAIR: SHX194

## 2022-05-11 SURGERY — REPAIR, HERNIA, INGUINAL, ADULT
Anesthesia: General | Site: Abdomen | Laterality: Right

## 2022-05-11 MED ORDER — ROCURONIUM BROMIDE 10 MG/ML (PF) SYRINGE
PREFILLED_SYRINGE | INTRAVENOUS | Status: AC
  Filled 2022-05-11: qty 10

## 2022-05-11 MED ORDER — PROPOFOL 10 MG/ML IV BOLUS
INTRAVENOUS | Status: AC
  Filled 2022-05-11: qty 20

## 2022-05-11 MED ORDER — MEPERIDINE HCL 25 MG/ML IJ SOLN: 6.2500 mg | INTRAMUSCULAR | Status: DC | PRN

## 2022-05-11 MED ORDER — CEFAZOLIN SODIUM-DEXTROSE 2-4 GM/100ML-% IV SOLN
INTRAVENOUS | Status: AC
  Filled 2022-05-11: qty 100

## 2022-05-11 MED ORDER — LIDOCAINE 2% (20 MG/ML) 5 ML SYRINGE
INTRAMUSCULAR | Status: DC | PRN
  Administered 2022-05-11: 60 mg via INTRAVENOUS

## 2022-05-11 MED ORDER — ONDANSETRON HCL 4 MG/2ML IJ SOLN
INTRAMUSCULAR | Status: DC | PRN
  Administered 2022-05-11: 4 mg via INTRAVENOUS

## 2022-05-11 MED ORDER — MIDAZOLAM HCL 2 MG/2ML IJ SOLN
2.0000 mg | Freq: Once | INTRAMUSCULAR | Status: AC
  Administered 2022-05-11: 2 mg via INTRAVENOUS

## 2022-05-11 MED ORDER — SODIUM CHLORIDE (PF) 0.9 % IJ SOLN
INTRAMUSCULAR | Status: DC | PRN
  Administered 2022-05-11: 30 mL via PERINEURAL

## 2022-05-11 MED ORDER — BUPIVACAINE HCL 0.5 % IJ SOLN
INTRAMUSCULAR | Status: DC | PRN
  Administered 2022-05-11: 10 mL

## 2022-05-11 MED ORDER — ACETAMINOPHEN 160 MG/5ML PO SOLN: 325.0000 mg | ORAL | Status: DC | PRN

## 2022-05-11 MED ORDER — DEXAMETHASONE SODIUM PHOSPHATE 10 MG/ML IJ SOLN
INTRAMUSCULAR | Status: AC
  Filled 2022-05-11: qty 1

## 2022-05-11 MED ORDER — PROPOFOL 10 MG/ML IV BOLUS
INTRAVENOUS | Status: DC | PRN
  Administered 2022-05-11: 200 mg via INTRAVENOUS

## 2022-05-11 MED ORDER — OXYCODONE HCL 5 MG PO TABS: 5.0000 mg | ORAL_TABLET | Freq: Four times a day (QID) | ORAL | 0 refills | Status: DC | PRN

## 2022-05-11 MED ORDER — ONDANSETRON HCL 4 MG/2ML IJ SOLN
INTRAMUSCULAR | Status: AC
  Filled 2022-05-11: qty 2

## 2022-05-11 MED ORDER — ACETAMINOPHEN 500 MG PO TABS
1000.0000 mg | ORAL_TABLET | ORAL | Status: AC
  Administered 2022-05-11: 1000 mg via ORAL

## 2022-05-11 MED ORDER — ROCURONIUM BROMIDE 10 MG/ML (PF) SYRINGE
PREFILLED_SYRINGE | INTRAVENOUS | Status: DC | PRN
  Administered 2022-05-11: 50 mg via INTRAVENOUS

## 2022-05-11 MED ORDER — OXYCODONE HCL 5 MG PO TABS
ORAL_TABLET | ORAL | Status: AC
  Filled 2022-05-11: qty 1

## 2022-05-11 MED ORDER — FENTANYL CITRATE (PF) 100 MCG/2ML IJ SOLN
INTRAMUSCULAR | Status: AC
  Filled 2022-05-11: qty 2

## 2022-05-11 MED ORDER — FENTANYL CITRATE (PF) 100 MCG/2ML IJ SOLN: 25.0000 ug | INTRAMUSCULAR | Status: DC | PRN

## 2022-05-11 MED ORDER — FENTANYL CITRATE (PF) 100 MCG/2ML IJ SOLN
50.0000 ug | Freq: Once | INTRAMUSCULAR | Status: AC
  Administered 2022-05-11: 50 ug via INTRAVENOUS

## 2022-05-11 MED ORDER — LACTATED RINGERS IV SOLN: INTRAVENOUS | Status: DC

## 2022-05-11 MED ORDER — IBUPROFEN 800 MG PO TABS: 800.0000 mg | ORAL_TABLET | Freq: Three times a day (TID) | ORAL | 0 refills | Status: DC | PRN

## 2022-05-11 MED ORDER — CEFAZOLIN SODIUM-DEXTROSE 2-4 GM/100ML-% IV SOLN
2.0000 g | INTRAVENOUS | Status: AC
  Administered 2022-05-11: 2 g via INTRAVENOUS

## 2022-05-11 MED ORDER — ACETAMINOPHEN 500 MG PO TABS
ORAL_TABLET | ORAL | Status: AC
  Filled 2022-05-11: qty 2

## 2022-05-11 MED ORDER — ACETAMINOPHEN 325 MG PO TABS: 325.0000 mg | ORAL_TABLET | ORAL | Status: DC | PRN

## 2022-05-11 MED ORDER — 0.9 % SODIUM CHLORIDE (POUR BTL) OPTIME
TOPICAL | Status: DC | PRN
  Administered 2022-05-11: 500 mL

## 2022-05-11 MED ORDER — ONDANSETRON HCL 4 MG/2ML IJ SOLN: 4.0000 mg | Freq: Once | INTRAMUSCULAR | Status: DC | PRN

## 2022-05-11 MED ORDER — SUGAMMADEX SODIUM 200 MG/2ML IV SOLN
INTRAVENOUS | Status: DC | PRN
  Administered 2022-05-11: 200 mg via INTRAVENOUS

## 2022-05-11 MED ORDER — OXYCODONE HCL 5 MG/5ML PO SOLN: 5.0000 mg | Freq: Once | ORAL | Status: AC | PRN

## 2022-05-11 MED ORDER — CHLORHEXIDINE GLUCONATE CLOTH 2 % EX PADS: 6.0000 | MEDICATED_PAD | Freq: Once | CUTANEOUS | Status: DC

## 2022-05-11 MED ORDER — MIDAZOLAM HCL 2 MG/2ML IJ SOLN
INTRAMUSCULAR | Status: AC
  Filled 2022-05-11: qty 2

## 2022-05-11 MED ORDER — OXYCODONE HCL 5 MG PO TABS
5.0000 mg | ORAL_TABLET | Freq: Once | ORAL | Status: AC | PRN
  Administered 2022-05-11: 5 mg via ORAL

## 2022-05-11 MED ORDER — FENTANYL CITRATE (PF) 100 MCG/2ML IJ SOLN
INTRAMUSCULAR | Status: DC | PRN
  Administered 2022-05-11: 25 ug via INTRAVENOUS
  Administered 2022-05-11: 50 ug via INTRAVENOUS

## 2022-05-11 MED ORDER — DEXAMETHASONE SODIUM PHOSPHATE 10 MG/ML IJ SOLN
INTRAMUSCULAR | Status: DC | PRN
  Administered 2022-05-11: 5 mg via INTRAVENOUS

## 2022-05-11 SURGICAL SUPPLY — 48 items
ADH SKN CLS APL DERMABOND .7 (GAUZE/BANDAGES/DRESSINGS) ×1
APL PRP STRL LF DISP 70% ISPRP (MISCELLANEOUS) ×1
BLADE CLIPPER SENSICLIP SURGIC (BLADE) ×2 IMPLANT
BLADE SURG 15 STRL LF DISP TIS (BLADE) ×2 IMPLANT
BLADE SURG 15 STRL SS (BLADE) ×1
CELLS DAT CNTRL 66122 CELL SVR (MISCELLANEOUS) IMPLANT
CHLORAPREP W/TINT 26 (MISCELLANEOUS) ×2 IMPLANT
COVER BACK TABLE 60X90IN (DRAPES) ×2 IMPLANT
COVER MAYO STAND STRL (DRAPES) ×2 IMPLANT
DERMABOND ADVANCED .7 DNX12 (GAUZE/BANDAGES/DRESSINGS) ×2 IMPLANT
DRAIN PENROSE 0.25X18 (DRAIN) IMPLANT
DRAPE LAPAROSCOPIC ABDOMINAL (DRAPES) ×2 IMPLANT
DRAPE UTILITY XL STRL (DRAPES) ×2 IMPLANT
ELECT REM PT RETURN 9FT ADLT (ELECTROSURGICAL) ×1
ELECTRODE REM PT RTRN 9FT ADLT (ELECTROSURGICAL) ×2 IMPLANT
GAUZE 4X4 16PLY ~~LOC~~+RFID DBL (SPONGE) ×2 IMPLANT
GLOVE BIOGEL PI IND STRL 7.0 (GLOVE) ×2 IMPLANT
GLOVE SURG SS PI 7.0 STRL IVOR (GLOVE) ×2 IMPLANT
GOWN STRL REUS W/TWL LRG LVL3 (GOWN DISPOSABLE) ×2 IMPLANT
KIT TURNOVER CYSTO (KITS) ×2 IMPLANT
MESH HERNIA 3X6 (Mesh General) IMPLANT
NDL HYPO 25X1 1.5 SAFETY (NEEDLE) ×2 IMPLANT
NEEDLE HYPO 25X1 1.5 SAFETY (NEEDLE) ×1 IMPLANT
NS IRRIG 500ML POUR BTL (IV SOLUTION) ×2 IMPLANT
PACK BASIN DAY SURGERY FS (CUSTOM PROCEDURE TRAY) ×2 IMPLANT
PAD ARMBOARD 7.5X6 YLW CONV (MISCELLANEOUS) ×2 IMPLANT
PENCIL SMOKE EVACUATOR (MISCELLANEOUS) ×2 IMPLANT
RETRACTOR WND ALEXIS 18 MED (MISCELLANEOUS) IMPLANT
RTRCTR WOUND ALEXIS 18CM MED (MISCELLANEOUS)
SPONGE T-LAP 4X18 ~~LOC~~+RFID (SPONGE) IMPLANT
SUT MNCRL AB 4-0 PS2 18 (SUTURE) ×2 IMPLANT
SUT PDS AB 0 CT1 36 (SUTURE) IMPLANT
SUT PDS AB 2-0 CT2 27 (SUTURE) IMPLANT
SUT PROLENE 2 0 CT2 30 (SUTURE) ×4 IMPLANT
SUT SILK 3 0 TIES 17X18 (SUTURE)
SUT SILK 3-0 18XBRD TIE BLK (SUTURE) IMPLANT
SUT VIC AB 2-0 SH 18 (SUTURE) ×2 IMPLANT
SUT VIC AB 2-0 SH 27 (SUTURE) ×1
SUT VIC AB 2-0 SH 27XBRD (SUTURE) ×2 IMPLANT
SUT VIC AB 3-0 SH 27 (SUTURE) ×1
SUT VIC AB 3-0 SH 27X BRD (SUTURE) ×2 IMPLANT
SUT VICRYL 2 0 18  UND BR (SUTURE) ×1
SUT VICRYL 2 0 18 UND BR (SUTURE) ×2 IMPLANT
SYR BULB IRRIG 60ML STRL (SYRINGE) ×2 IMPLANT
SYR CONTROL 10ML LL (SYRINGE) ×2 IMPLANT
TOWEL OR 17X26 10 PK STRL BLUE (TOWEL DISPOSABLE) ×2 IMPLANT
TUBE CONNECTING 12X1/4 (SUCTIONS) ×2 IMPLANT
YANKAUER SUCT BULB TIP NO VENT (SUCTIONS) ×2 IMPLANT

## 2022-05-11 NOTE — Progress Notes (Signed)
Assisted Dr. Ambrose Pancoast with right, transabdominal plane, ultrasound guided block. Side rails up, monitors on throughout procedure. See vital signs in flow sheet. Tolerated Procedure well.

## 2022-05-11 NOTE — Anesthesia Preprocedure Evaluation (Addendum)
Anesthesia Evaluation  Patient identified by MRN, date of birth, ID band Patient awake    Reviewed: Allergy & Precautions, NPO status , Patient's Chart, lab work & pertinent test results  Airway Mallampati: II  TM Distance: >3 FB Neck ROM: Full    Dental no notable dental hx. (+) Poor Dentition, Chipped, Missing, Loose,    Pulmonary former smoker   Pulmonary exam normal breath sounds clear to auscultation       Cardiovascular hypertension, Pt. on medications Normal cardiovascular exam Rhythm:Regular Rate:Normal     Neuro/Psych negative neurological ROS  negative psych ROS   GI/Hepatic Neg liver ROS,GERD  Medicated,,  Endo/Other  negative endocrine ROS    Renal/GU Renal diseaseS/P partial nephrectomy  negative genitourinary   Musculoskeletal negative musculoskeletal ROS (+)    Abdominal   Peds negative pediatric ROS (+)  Hematology negative hematology ROS (+)   Anesthesia Other Findings   Reproductive/Obstetrics negative OB ROS                             Anesthesia Physical Anesthesia Plan  ASA: 3  Anesthesia Plan: General   Post-op Pain Management:    Induction: Intravenous  PONV Risk Score and Plan: 2 and Ondansetron, Dexamethasone and Treatment may vary due to age or medical condition  Airway Management Planned: Oral ETT  Additional Equipment:   Intra-op Plan:   Post-operative Plan: Extubation in OR  Informed Consent: I have reviewed the patients History and Physical, chart, labs and discussed the procedure including the risks, benefits and alternatives for the proposed anesthesia with the patient or authorized representative who has indicated his/her understanding and acceptance.     Dental advisory given  Plan Discussed with: CRNA, Surgeon and Anesthesiologist  Anesthesia Plan Comments:         Anesthesia Quick Evaluation

## 2022-05-11 NOTE — Transfer of Care (Signed)
Immediate Anesthesia Transfer of Care Note  Patient: Derek Cardenas  Procedure(s) Performed: Procedure(s) (LRB): OPEN RIGHT INGUINAL HERNIA REPAIR WITH MESH (Right)  Patient Location: PACU  Anesthesia Type: General  Level of Consciousness: awake, oriented, sedated and patient cooperative  Airway & Oxygen Therapy: Patient Spontanous Breathing and Patient connected to face mask oxygen  Post-op Assessment: Report given to PACU RN and Post -op Vital signs reviewed and stable  Post vital signs: Reviewed and stable  Complications: No apparent anesthesia complications Last Vitals:  Vitals Value Taken Time  BP 120/70 05/11/22 1449  Temp 36.4 C 05/11/22 1449  Pulse 89 05/11/22 1450  Resp 20 05/11/22 1450  SpO2 100 % 05/11/22 1450  Vitals shown include unvalidated device data.  Last Pain:  Vitals:   05/11/22 1152  TempSrc: Oral  PainSc: 0-No pain      Patients Stated Pain Goal: 6 (41/58/30 9407)  Complications: No notable events documented.

## 2022-05-11 NOTE — Op Note (Signed)
Preop diagnosis: right inguinal hernia  Postop diagnosis: right inguinal hernia  Procedure: open Right inguinal hernia repair with mesh  Surgeon: Gurney Maxin, M.D.  Asst: none  Anesthesia: Gen.   Indications for procedure: Derek Cardenas is a 72 y.o. male with symptoms of pain and enlarging Right inguinal hernia(s). After discussing risks, alternatives and benefits he decided on open repair and was brought to day surgery for repair.  Description of procedure: The patient was brought into the operative suite, placed supine. Anesthesia was administered with endotracheal tube. Patient was strapped in place. The patient was prepped and draped in the usual sterile fashion.  The anterior superior iliac spine and pubic tubercle were identified on the Right side. An incision was made 1cm above the connecting line, representative of the location of the inguinal ligament. The subcutaneous tissue was bluntly dissected, scarpa's fascia was dissected away. The external abdominal oblique fascia was identified and sharply opened down to the external inguinal ring. The conjoint tendon and inguinal ligament were identified. The cord structures and sac were dissected free of the surrounding tissue in 360 degrees. A penrose drain was used to encircle the contents. The cremasteric fibers were dissected free of the contents of the cord and hernia sac. The cord structures (vessels and vas deferens) were identified and carefully dissected away from the hernia sac. The hernia sac contained small intestine and was opened and contents reduced into the peritoneal space.The hernia sac was dissected down to the internal inguinal ring. The vas deferens tore during dissection. Preperitoneal fat was identified showing appropriate dissection. The sac was then reduced into the preperitoneal space. The hernia was indirect. A 3x6 Bard mesh was then used to close the defect and reinforce the floor. The mesh was sutured to the lacunar  ligament and inguinal ligament using a 2-0 prolene in running fashion. Next the superior edge of the mesh was sutured to the conjoined tendon using a 2-0 running Prolene. An additional 2-0 Prolene was used to suture the tail ends of the mesh together re-creating the deep ring. Cord structures are running in a neutral position through the mesh. Next the external abdominal oblique fascia was closed with a 2-0 Vicryl in interrupted fashion to re-create the external inguinal ring. Scarpa's fascia was closed with 3-0 Vicryl in running fashion. Skin was closed with a 4-0 Monocryl subcuticular stitch in running fashion. Dermabond place for dressing. Patient woke from anesthesia and brought to PACU in stable condition. All counts are correct.  Findings: right inguinal hernia  Specimen: none  Blood loss: 20 ml  Local anesthesia: 10 ml Marcaine  Complications: none  Implant: 3 x 6 in Bard Mesh  Gurney Maxin, M.D. General, Bariatric, & Minimally Invasive Surgery [facility], [place] 2:35 PM 05/11/2022

## 2022-05-11 NOTE — Anesthesia Procedure Notes (Signed)
Procedure Name: Intubation Date/Time: 05/11/2022 1:27 PM  Performed by: Suan Halter, CRNAPre-anesthesia Checklist: Patient identified, Emergency Drugs available, Suction available and Patient being monitored Patient Re-evaluated:Patient Re-evaluated prior to induction Oxygen Delivery Method: Circle system utilized Preoxygenation: Pre-oxygenation with 100% oxygen Induction Type: IV induction Ventilation: Mask ventilation without difficulty Laryngoscope Size: Mac and 4 Grade View: Grade I Tube type: Oral Tube size: 7.0 mm Number of attempts: 1 Airway Equipment and Method: Stylet and Oral airway Placement Confirmation: ETT inserted through vocal cords under direct vision, positive ETCO2 and breath sounds checked- equal and bilateral Secured at: 22 cm Tube secured with: Tape Dental Injury: Teeth and Oropharynx as per pre-operative assessment

## 2022-05-11 NOTE — Discharge Instructions (Addendum)
[facility] _______[facility]  UMBILICAL OR INGUINAL HERNIA REPAIR: POST OP INSTRUCTIONS  Always review your discharge instruction sheet given to you by the facility where your surgery was performed. IF YOU HAVE DISABILITY OR FAMILY LEAVE FORMS, YOU MUST BRING THEM TO THE OFFICE FOR PROCESSING.   DO NOT GIVE THEM TO YOUR DOCTOR.  1. A  prescription for pain medication may be given to you upon discharge.  Take your pain medication as prescribed, if needed.  If narcotic pain medicine is not needed, then you may take acetaminophen (Tylenol) or ibuprofen (Advil) as needed. 2. Take your usually prescribed medications unless otherwise directed. If you need a refill on your pain medication, please contact your pharmacy.  They will contact our office to request authorization. Prescriptions will not be filled after 5 pm or on week-ends. 3. You should follow a light diet the first 24 hours after arrival home, such as soup and crackers, etc.  Be sure to include lots of fluids daily.  Resume your normal diet the day after surgery. 4.Most patients will experience some swelling and bruising around the umbilicus or in the groin and scrotum.  Ice packs and reclining will help.  Swelling and bruising can take several days to resolve.  6. It is common to experience some constipation if taking pain medication after surgery.  Increasing fluid intake and taking a stool softener (such as Colace) will usually help or prevent this problem from occurring.  A mild laxative (Milk of Magnesia or Miralax) should be taken according to package directions if there are no bowel movements after 48 hours. 7. Unless discharge instructions indicate otherwise, you may remove your bandages 24-48 hours after surgery, and you may shower at that time.  You may have steri-strips (small skin tapes) in place directly over the incision.  These strips should be left on the skin for 7-10 days.  If your surgeon used skin glue on the  incision, you may shower in 24 hours.  The glue will flake off over the next 2-3 weeks.  Any sutures or staples will be removed at the office during your follow-up visit. 8. ACTIVITIES:  You may resume regular (light) daily activities beginning the next day--such as daily self-care, walking, climbing stairs--gradually increasing activities as tolerated.  You may have sexual intercourse when it is comfortable.  Refrain from any heavy lifting or straining until approved by your doctor.  a.You may drive when you are no longer taking prescription pain medication, you can comfortably wear a seatbelt, and you can safely maneuver your car and apply brakes. b.RETURN TO WORK:   _____________________________________________  9.You should see your doctor in the office for a follow-up appointment approximately 2-3 weeks after your surgery.  Make sure that you call for this appointment within a day or two after you arrive home to insure a convenient appointment time. 10.OTHER INSTRUCTIONS: _________________________    _____________________________________  WHEN TO CALL YOUR DOCTOR: Fever over 101.0 Inability to urinate Nausea and/or vomiting Extreme swelling or bruising Continued bleeding from incision. Increased pain, redness, or drainage from the incision  The clinic staff is available to answer your questions during regular business hours.  Please don't hesitate to call and ask to speak to one of the nurses for clinical concerns.  If you have a medical emergency, go to the nearest emergency room or call 911.  A surgeon from [facility] is always on call at the hospital   [address], [address],   [place], [place]  [address] ?  [address], [place], [place]   [address] [phone] ? [phone] ? FAX [phone] Web site: [email or web address]   Post Anesthesia Home Care Instructions  Activity: Get plenty of rest for the remainder of the day. A responsible  individual must stay with you for 24 hours following the procedure.  For the next 24 hours, DO NOT: -Drive a car -Paediatric nurse -Drink alcoholic beverages -Take any medication unless instructed by your physician -Make any legal decisions or sign important papers.  Meals: Start with liquid foods such as gelatin or soup. Progress to regular foods as tolerated. Avoid greasy, spicy, heavy foods. If nausea and/or vomiting occur, drink only clear liquids until the nausea and/or vomiting subsides. Call your physician if vomiting continues.  Special Instructions/Symptoms: Your throat may feel dry or sore from the anesthesia or the breathing tube placed in your throat during surgery. If this causes discomfort, gargle with warm salt water. The discomfort should disappear within 24 hours.   Regional Anesthesia Blocks  1. Numbness or the inability to move the "blocked" extremity may last from 3-48 hours after placement. The length of time depends on the medication injected and your individual response to the medication. If the numbness is not going away after 48 hours, call your surgeon.  2. The extremity that is blocked will need to be protected until the numbness is gone and the  Strength has returned. Because you cannot feel it, you will need to take extra care to avoid injury. Because it may be weak, you may have difficulty moving it or using it. You may not know what position it is in without looking at it while the block is in effect.  3. For blocks in the legs and feet, returning to weight bearing and walking needs to be done carefully. You will need to wait until the numbness is entirely gone and the strength has returned. You should be able to move your leg and foot normally before you try and bear weight or walk. You will need someone to be with you when you first try to ensure you do not fall and possibly risk injury.  4. Bruising and tenderness at the needle site are common side effects  and will resolve in a few days.  5. Persistent numbness or new problems with movement should be communicated to the surgeon.  May take Tylenol beginning at 6 PM as needed for soreness/discomfort.

## 2022-05-11 NOTE — H&P (Signed)
     Elisa Sorlie is an 72 y.o. male.  HPI: 72 yo male with right inguinal hernia. It has been present for several months. It is uncomfortable with movement. He denies nausea or vomiting.  Past Medical History:  Diagnosis Date   Diverticulosis    GERD (gastroesophageal reflux disease)    Hypertension    right renal ca dx'd 07/2020    Past Surgical History:  Procedure Laterality Date   BOWEL RESECTION  1999   LAPAROSCOPIC APPENDECTOMY N/A 08/06/2020   Procedure: APPENDECTOMY LAPAROSCOPIC;  Surgeon: Stark Klein, MD;  Location: [facility];  Service: General;  Laterality: N/A;   LYMPHADENECTOMY Bilateral 06/01/2021   Procedure: Noel Journey, PELVIC;  Surgeon: Raynelle Bring, MD;  Location: [facility];  Service: Urology;  Laterality: Bilateral;   OPERATIVE ULTRASOUND N/A 09/29/2020   Procedure: INTRA OPERATIVE ULTRASOUND;  Surgeon: Raynelle Bring, MD;  Location: [facility];  Service: Urology;  Laterality: N/A;   ROBOT ASSISTED LAPAROSCOPIC RADICAL PROSTATECTOMY N/A 06/01/2021   Procedure: XI ROBOTIC ASSISTED LAPAROSCOPIC RADICAL PROSTATECTOMY LEVEL 2;  Surgeon: Raynelle Bring, MD;  Location: [facility];  Service: Urology;  Laterality: N/A;   ROBOTIC ASSITED PARTIAL NEPHRECTOMY Right 09/29/2020   Procedure: XI ROBOTIC ASSITED Soddy-Daisy;  Surgeon: Raynelle Bring, MD;  Location: [facility];  Service: Urology;  Laterality: Right;    Family History  Problem Relation Age of Onset   Melanoma Mother     Social History:  reports that he has quit smoking. His smoking use included cigarettes. He quit smokeless tobacco use about 20 months ago.  His smokeless tobacco use included chew. He reports that he does not drink alcohol and does not use drugs.  Allergies: No Known Allergies  Medications: I have reviewed the patient's current medications.  No results found for this or any previous visit (from the past 48 hour(s)).  No results found.  Review of Systems  Constitutional: Negative.    HENT: Negative.    Eyes: Negative.   Respiratory: Negative.    Cardiovascular: Negative.   Gastrointestinal: Negative.   Genitourinary: Negative.   Musculoskeletal: Negative.   Skin: Negative.   Neurological: Negative.   Endo/Heme/Allergies: Negative.   Psychiatric/Behavioral: Negative.      PE Blood pressure 112/78, pulse 71, temperature 97.9 F (36.6 C), temperature source Oral, resp. rate 17, height '5\' 6"'$  (1.676 m), weight 78.5 kg, SpO2 96 %. Constitutional: NAD; conversant; no deformities Eyes: Moist conjunctiva; no lid lag; anicteric; PERRL Neck: Trachea midline; no thyromegaly Lungs: Normal respiratory effort; no tactile fremitus CV: RRR; no palpable thrills; no pitting edema GI: Abd soft, right inguinal hernia; no palpable hepatosplenomegaly MSK: Normal gait; no clubbing/cyanosis Psychiatric: Appropriate affect; alert and oriented x3 Lymphatic: No palpable cervical or axillary lymphadenopathy Skin: No major subcutaneous nodules. Warm and dry   Assessment/Plan: 72 yo male with right inguinal hernia -open right inguinal hernia repair with mesh  I reviewed last 24 h vitals and pain scores, last 48 h intake and output, last 24 h labs and trends, and last 24 h imaging results.  This care required high  level of medical decision making.   Arta Bruce Pema Thomure 05/11/2022, 12:50 PM

## 2022-05-11 NOTE — Anesthesia Procedure Notes (Signed)
Anesthesia Regional Block: TAP block   Pre-Anesthetic Checklist: , timeout performed,  Correct Patient, Correct Site, Correct Laterality,  Correct Procedure, Correct Position, site marked,  Risks and benefits discussed,  Surgical consent,  Pre-op evaluation,  At surgeon's request and post-op pain management  Laterality: Right  Prep: chloraprep       Needles:  Injection technique: Single-shot  Needle Type: Echogenic Stimulator Needle     Needle Length: 5cm  Needle Gauge: 22     Additional Needles:   Procedures:, nerve stimulator,,, ultrasound used (permanent image in chart),,    Narrative:  Start time: 05/11/2022 12:45 PM End time: 05/11/2022 12:51 PM Injection made incrementally with aspirations every 5 mL.  Performed by: Personally  Anesthesiologist: Janeece Riggers, MD  Additional Notes: Functioning IV was confirmed and monitors were applied.  A 42m 22ga Arrow echogenic stimulator needle was used. Sterile prep and drape,hand hygiene and sterile gloves were used. Ultrasound guidance: relevant anatomy identified, needle position confirmed, local anesthetic spread visualized around nerve(s)., vascular puncture avoided.  Image printed for medical record. Negative aspiration and negative test dose prior to incremental administration of local anesthetic. The patient tolerated the procedure well.

## 2022-05-13 ENCOUNTER — Encounter (HOSPITAL_BASED_OUTPATIENT_CLINIC_OR_DEPARTMENT_OTHER): Payer: Self-pay | Admitting: General Surgery

## 2022-05-13 NOTE — Anesthesia Postprocedure Evaluation (Signed)
Anesthesia Post Note  Patient: Bodi Palmeri  Procedure(s) Performed: OPEN RIGHT INGUINAL HERNIA REPAIR WITH MESH (Right: Abdomen)     Patient location during evaluation: PACU Anesthesia Type: General Level of consciousness: awake and alert Pain management: pain level controlled Vital Signs Assessment: post-procedure vital signs reviewed and stable Respiratory status: spontaneous breathing, nonlabored ventilation, respiratory function stable and patient connected to nasal cannula oxygen Cardiovascular status: blood pressure returned to baseline and stable Postop Assessment: no apparent nausea or vomiting Anesthetic complications: no   No notable events documented.  Last Vitals:  Vitals:   05/11/22 1515 05/11/22 1545  BP: 131/79 124/74  Pulse: 88 80  Resp: 16 15  Temp:  36.4 C  SpO2: 93% 98%    Last Pain:  Vitals:   05/12/22 1243  TempSrc:   PainSc: 2    Pain Goal: Patients Stated Pain Goal: 4 (05/11/22 1545)                 Keiondra Brookover

## 2022-05-18 ENCOUNTER — Emergency Department (HOSPITAL_BASED_OUTPATIENT_CLINIC_OR_DEPARTMENT_OTHER): Payer: Medicare Other

## 2022-05-18 ENCOUNTER — Encounter (HOSPITAL_BASED_OUTPATIENT_CLINIC_OR_DEPARTMENT_OTHER): Payer: Self-pay | Admitting: Emergency Medicine

## 2022-05-18 ENCOUNTER — Other Ambulatory Visit: Payer: Self-pay

## 2022-05-18 ENCOUNTER — Emergency Department (HOSPITAL_BASED_OUTPATIENT_CLINIC_OR_DEPARTMENT_OTHER)
Admission: EM | Admit: 2022-05-18 | Discharge: 2022-05-18 | Disposition: A | Discharge status: Home / Self Care | Payer: Medicare Other | Attending: Emergency Medicine | Admitting: Emergency Medicine

## 2022-05-18 DIAGNOSIS — K625 Hemorrhage of anus and rectum: Secondary | ICD-10-CM | POA: Diagnosis not present

## 2022-05-18 LAB — CBC
HCT: 43.8 % (ref 39.0–52.0)
Hemoglobin: 15.2 g/dL (ref 13.0–17.0)
MCH: 28.1 pg (ref 26.0–34.0)
MCHC: 34.7 g/dL (ref 30.0–36.0)
MCV: 81.1 fL (ref 80.0–100.0)
Platelets: 264 10*3/uL (ref 150–400)
RBC: 5.4 MIL/uL (ref 4.22–5.81)
RDW: 13.5 % (ref 11.5–15.5)
WBC: 9.3 10*3/uL (ref 4.0–10.5)
nRBC: 0 % (ref 0.0–0.2)

## 2022-05-18 LAB — COMPREHENSIVE METABOLIC PANEL
ALT: 19 U/L (ref 0–44)
AST: 24 U/L (ref 15–41)
Albumin: 4.2 g/dL (ref 3.5–5.0)
Alkaline Phosphatase: 86 U/L (ref 38–126)
Anion gap: 13 (ref 5–15)
BUN: 22 mg/dL (ref 8–23)
CO2: 20 mmol/L — ABNORMAL LOW (ref 22–32)
Calcium: 9.5 mg/dL (ref 8.9–10.3)
Chloride: 104 mmol/L (ref 98–111)
Creatinine, Ser: 1.22 mg/dL (ref 0.61–1.24)
GFR, Estimated: >60 mL/min (ref >60)
Glucose, Bld: 142 mg/dL — ABNORMAL HIGH (ref 70–99)
Potassium: 4.3 mmol/L (ref 3.5–5.1)
Sodium: 137 mmol/L (ref 135–145)
Total Bilirubin: 0.6 mg/dL (ref 0.3–1.2)
Total Protein: 7.1 g/dL (ref 6.5–8.1)

## 2022-05-18 LAB — OCCULT BLOOD X 1 CARD TO LAB, STOOL: Fecal Occult Bld: POSITIVE — AB

## 2022-05-18 MED ORDER — IOHEXOL 350 MG/ML SOLN
100.0000 mL | Freq: Once | INTRAVENOUS | Status: AC | PRN
  Administered 2022-05-18: 85 mL via INTRAVENOUS

## 2022-05-18 MED ORDER — LACTATED RINGERS IV BOLUS
1000.0000 mL | Freq: Once | INTRAVENOUS | Status: AC
  Administered 2022-05-18: 1000 mL via INTRAVENOUS

## 2022-05-18 MED ORDER — NYSTATIN 100000 UNIT/GM EX POWD
Freq: Once | CUTANEOUS | Status: AC
  Filled 2022-05-18: qty 15

## 2022-05-18 MED ORDER — NYSTATIN 100000 UNIT/GM EX CREA: TOPICAL_CREAM | CUTANEOUS | 0 refills | Status: DC

## 2022-05-18 NOTE — ED Notes (Signed)
Patient transported to CT 

## 2022-05-18 NOTE — ED Notes (Signed)
Pt had gallbladder sx last week.  Surrounding area around surgical site and down rt. Thigh is red, rashy in appearance.  Pt does c/o pain in rt. Groin area esp when he gets up.  Denies N/V.  Has had 2 episodes todat of bloody stool. Claims it is bright red blood

## 2022-05-18 NOTE — ED Provider Notes (Signed)
[facility] Provider Note   CSN: [id number] Arrival date & time: 05/18/22  1840     History Chief Complaint  Patient presents with   Rectal Bleeding    HPI Derek Cardenas is a 72 y.o. male presenting for chief complaint of right lower quadrant pain and bright red blood per rectum.  Endorses an extensive medical history including diverticulosis and diverticulitis status postresection in the past.  He had a hernia repair last week and has extensive tenderness to palpation over the surgical site.  He started having 2 episodes of bright red blood per rectum today.  Bleeding appears to have resolved at this time.  He denies fevers or chills, nausea vomiting, syncope shortness of breath.  Is otherwise ambulatory tolerating p.o. intake.  Last colonoscopy 13 years ago and Cologuard last year was negative.  Patient's recorded medical, surgical, social, medication list and allergies were reviewed in the Snapshot window as part of the initial history.   Review of Systems   Review of Systems  Constitutional:  Negative for chills and fever.  HENT:  Negative for ear pain and sore throat.   Eyes:  Negative for pain and visual disturbance.  Respiratory:  Negative for cough and shortness of breath.   Cardiovascular:  Negative for chest pain and palpitations.  Gastrointestinal:  Positive for abdominal pain and blood in stool. Negative for vomiting.  Genitourinary:  Negative for dysuria and hematuria.  Musculoskeletal:  Negative for arthralgias and back pain.  Skin:  Negative for color change and rash.  Neurological:  Negative for seizures and syncope.  All other systems reviewed and are negative.   Physical Exam Updated Vital Signs BP 132/77   Pulse 78   Temp 98.3 F (36.8 C) (Oral)   Resp 18   Ht '5\' 6"'$  (1.676 m)   Wt 78.5 kg   SpO2 98%   BMI 27.94 kg/m  Physical Exam Vitals and nursing note reviewed.  Constitutional:      General: He is not in  acute distress.    Appearance: He is well-developed.  HENT:     Head: Normocephalic and atraumatic.  Eyes:     Conjunctiva/sclera: Conjunctivae normal.  Cardiovascular:     Rate and Rhythm: Normal rate and regular rhythm.     Heart sounds: No murmur heard. Pulmonary:     Effort: Pulmonary effort is normal. No respiratory distress.     Breath sounds: Normal breath sounds.  Abdominal:     Palpations: Abdomen is soft.     Tenderness: There is no abdominal tenderness.  Musculoskeletal:        General: No swelling.     Cervical back: Neck supple.  Skin:    General: Skin is warm and dry.     Capillary Refill: Capillary refill takes less than 2 seconds.  Neurological:     Mental Status: He is alert.  Psychiatric:        Mood and Affect: Mood normal.      ED Course/ Medical Decision Making/ A&P Clinical Course as of 05/18/22 2323  Tue May 18, 2022  2036 Ischemia study [CC]    Clinical Course User Index [CC] Tretha Sciara, MD    Procedures Procedures   Medications Ordered in ED Medications  lactated ringers bolus 1,000 mL (0 mLs Intravenous Stopped 05/18/22 2234)  iohexol (OMNIPAQUE) 350 MG/ML injection 100 mL (85 mLs Intravenous Contrast Given 05/18/22 2050)  nystatin (MYCOSTATIN/NYSTOP) topical powder ( Topical Given 05/18/22 2238)  Medical Decision Making:    Derek Cardenas is a 72 y.o. male who presented to the ED today with chief complaint of a red blood per rectum now resolved detailed above.     Patient's presentation is complicated by their history of advanced age, recent surgical procedure.  Patient placed on continuous vitals and telemetry monitoring while in ED which was reviewed periodically.   Complete initial physical exam performed, notably the patient  was hemodynamically stable no acute distress.  No active melena nor hematochezia on exam at this time.      Reviewed and confirmed nursing documentation for past medical history, family history, social  history.    Initial Assessment:   With the patient's presentation of bright red blood per rectum, most likely diagnosis is diverticular bleed given history of similar. Other diagnoses were considered including (but not limited to) postop injury, bowel injury given recent surgery. These are considered less likely due to history of present illness and physical exam findings.   This is most consistent with an acute life/limb threatening illness complicated by underlying chronic conditions.  Initial Plan:  CT abdomen pelvis with contrast to evaluate for structural intra-abdominal etiology of disease.  Will add on angiography to evaluate for active GI bleeding Screening labs including CBC and Metabolic panel to evaluate for infectious or metabolic etiology of disease.  Objective evaluation as below reviewed with plan for close reassessment  Initial Study Results:   Laboratory  All laboratory results reviewed without evidence of clinically relevant pathology.     Radiology  All images reviewed independently. Agree with radiology report at this time.   CT Angio Abd/Pel W and/or Wo Contrast  Result Date: 05/18/2022 CLINICAL DATA:  Lower GI bleed.  Recent right inguinal hernia repair EXAM: CTA ABDOMEN AND PELVIS WITHOUT AND WITH CONTRAST TECHNIQUE: Multidetector CT imaging of the abdomen and pelvis was performed using the standard protocol during bolus administration of intravenous contrast. Multiplanar reconstructed images and MIPs were obtained and reviewed to evaluate the vascular anatomy. RADIATION DOSE REDUCTION: This exam was performed according to the departmental dose-optimization program which includes automated exposure control, adjustment of the mA and/or kV according to patient size and/or use of iterative reconstruction technique. CONTRAST:  51m OMNIPAQUE IOHEXOL 350 MG/ML SOLN COMPARISON:  PET CT 04/27/2021 FINDINGS: VASCULAR Aorta: Normal caliber aorta without aneurysm, dissection,  vasculitis or significant stenosis. Celiac: Patent without evidence of aneurysm, dissection, vasculitis or significant stenosis. SMA: Patent without evidence of aneurysm, dissection, vasculitis or significant stenosis. Renals: Both renal arteries are patent without evidence of aneurysm, dissection, vasculitis, fibromuscular dysplasia or significant stenosis. IMA: Patent without evidence of aneurysm, dissection, vasculitis or significant stenosis. Inflow: Patent without evidence of aneurysm, dissection, vasculitis or significant stenosis. Proximal Outflow: Bilateral common femoral and visualized portions of the superficial and profunda femoral arteries are patent without evidence of aneurysm, dissection, vasculitis or significant stenosis. Veins: Portal venous phase imaging demonstrates patency of the portal, splenic, and mesenteric veins. The iliac veins and IVC are patent. No acute venous finding. Review of the MIP images confirms the above findings. NON-VASCULAR Lower chest: No basilar airspace disease or pleural effusion. Hepatobiliary: No focal liver abnormality is seen. Mild hepatic steatosis. No gallstones, gallbladder wall thickening, or biliary dilatation. Pancreas: No ductal dilatation or inflammation. Spleen: Normal in size without focal abnormality. Adrenals/Urinary Tract: No adrenal nodule. Cortical scarring in the upper pole of the right kidney. No hydronephrosis. Simple cyst in the upper left kidney, needs no further follow-up. No hydronephrosis or  renal calculi. Right lateral bladder calcification versus surgical clip. Stomach/Bowel: No accumulation of contrast in the GI tract to localize site of GI bleed. Enteric sutures in the sigmoid. Probable appendectomy. Colonic diverticulosis most prominent in the descending and sigmoid colon. No acute diverticulitis. Equivocal rectal wall thickening. Lymphatic: There are no enlarged lymph nodes in the abdomen or pelvis. Reproductive: Prostatectomy. Other:  Recent right inguinal hernia repair with stranding in the inguinal region extending into the upper scrotum. No focal fluid collection. Two fat containing supraumbilical ventral abdominal wall hernias contain only fat. No free air or significant ascites. Musculoskeletal: There are no acute or suspicious osseous abnormalities. Mild bilateral hip degenerative change. IMPRESSION: VASCULAR 1. No contrast accumulation in the GI tract to localize site of GI bleed. 2. No acute vascular findings. NON-VASCULAR 1. Colonic diverticulosis without diverticulitis. 2. Recent right inguinal hernia repair with soft tissue stranding extending into the inguinal canal, likely postsurgical. No focal fluid collection. Electronically Signed   By: Keith Rake M.D.   On: 05/18/2022 21:24     Final Assessment and Plan:   Reassessed patient at bedside.  His rectal exam had no blood at this time.  Stool appeared light brown without any melena nor hematochezia.  Patient was observed in emergency department for 4 hours for a total of 6 hours without any further bright red blood per rectum.  Hemoglobin 15.2.  Patient does not have any lightheadedness nor presyncope. Had a prolonged shared medical decision making conversation with patient.  Offered hospital observation, continued ED observation or outpatient follow-up with surgeon as discussed earlier.  Patient would like to be discharged to follow-up with surgeon in the morning as he has already called to schedule this appointment. Given objective reassuring evaluation and resolution of hematochezia, I believe this is reasonable.  Patient may need to return to the emergency department should he have recurrence of his bright red blood per rectum though reassuring hemoglobin at this time and patient stable for outpatient care pending symptomatic change.  Disposition:  I have considered need for hospitalization, however, considering all of the above, I believe this patient is stable for  discharge at this time.  Patient/family educated about specific return precautions for given chief complaint and symptoms.  Patient/family educated about follow-up with PCP/Gen Surg.     Patient/family expressed understanding of return precautions and need for follow-up. Patient spoken to regarding all imaging and laboratory results and appropriate follow up for these results. All education provided in verbal form with additional information in written form. Time was allowed for answering of patient questions. Patient discharged.    Emergency Department Medication Summary:   Medications  lactated ringers bolus 1,000 mL (0 mLs Intravenous Stopped 05/18/22 2234)  iohexol (OMNIPAQUE) 350 MG/ML injection 100 mL (85 mLs Intravenous Contrast Given 05/18/22 2050)  nystatin (MYCOSTATIN/NYSTOP) topical powder ( Topical Given 05/18/22 2238)    Clinical Impression:  1. BRBPR (bright red blood per rectum)      Discharge   Final Clinical Impression(s) / ED Diagnoses Final diagnoses:  BRBPR (bright red blood per rectum)    Rx / DC Orders ED Discharge Orders          Ordered    nystatin cream (MYCOSTATIN)        05/18/22 5465              Tretha Sciara, MD 05/18/22 2324

## 2022-05-18 NOTE — ED Triage Notes (Signed)
Pt arrives to ED with c/o rectal bleeding that started today, x2 episodes associated with diarrhea. He reports constant pain in his abdomen since his hernia surgery last week.

## 2022-10-07 ENCOUNTER — Other Ambulatory Visit: Payer: Self-pay | Admitting: Urology

## 2022-10-10 NOTE — Progress Notes (Signed)
COVID Vaccine received:  []  No [x]  Yes Date of any COVID positive Test in last 90 days:  PCP -Mady Gemma, PA-C at [facility] [phone] (Work)  [phone] (Fax)  Cardiologist - none  Chest x-ray - 10-08-2018  2v   CE EKG - (06-14-21 epic )  will repeat at PST  Stress Test -  ECHO -  Cardiac Cath -   PCR screen: []  Ordered & Completed           []   No Order but Needs PROFEND           [x]   N/A for this surgery  Surgery Plan:  [x]  Ambulatory                            []  Outpatient in bed                            []  Admit  Anesthesia:    [x]  General  []  Spinal                           []   Choice []   MAC  Bowel Prep - [x]  No  []   Yes ______  Pacemaker / ICD device [x]  No []  Yes   Spinal Cord Stimulator:[x]  No []  Yes       History of Sleep Apnea? [x]  No []  Yes   CPAP used?- [x]  No []  Yes    Does the patient monitor blood sugar?          []  No []  Yes  [x]  N/A  Patient has: [x]  NO Hx DM   []  Pre-DM                 []  DM1  []   DM2  Blood Thinner / Instructions:  none Aspirin Instructions:  none  ERAS Protocol Ordered: [x]  No  []  Yes Patient is to be NPO after: Midnight prior  Comments:   Activity level: Patient is able / unable to climb a flight of stairs without difficulty; []  No CP  []  No SOB, but would have ___   Patient can / can not perform ADLs without assistance.   Anesthesia review: HTN, GERD, s/p prostatectomy for Ca, Right partial nephrectomy for Ca   Patient denies shortness of breath, fever, cough and chest pain at PAT appointment.  Patient verbalized understanding and agreement to the Pre-Surgical Instructions that were given to them at this PAT appointment. Patient was also educated of the need to review these PAT instructions again prior to his surgery.I reviewed the appropriate phone numbers to call if they have any and questions or concerns.

## 2022-10-10 NOTE — Patient Instructions (Signed)
SURGICAL WAITING ROOM VISITATION Patients having surgery or a procedure may have no more than 2 support people in the waiting area - these visitors may rotate in the visitor waiting room.   Due to an increase in RSV and influenza rates and associated hospitalizations, children ages 68 and under may not visit patients in [facility] hospitals. If the patient needs to stay at the hospital during part of their recovery, the visitor guidelines for inpatient rooms apply.  PRE-OP VISITATION  Pre-op nurse will coordinate an appropriate time for 1 support person to accompany the patient in pre-op.  This support person may not rotate.  This visitor will be contacted when the time is appropriate for the visitor to come back in the pre-op area.  Please refer to the [facility] website for the visitor guidelines for Inpatients (after your surgery is over and you are in a regular room).  You are not required to quarantine at this time prior to your surgery. However, you must do this: Hand Hygiene often Do NOT share personal items Notify your provider if you are in close contact with someone who has COVID or you develop fever 100.4 or greater, new onset of sneezing, cough, sore throat, shortness of breath or body aches.  If you test positive for Covid or have been in contact with anyone that has tested positive in the last 10 days please notify you surgeon.    Your procedure is scheduled on:  Monday  October 18, 2022  Report to [facility] Main Entrance: [address] [facility] entrance where the [employer] is available.   Report to admitting at: 09:00  AM  +++++Call this number if you have any questions or problems the morning of surgery [phone]  DO NOT EAT OR DRINK ANYTHING AFTER MIDNIGHT THE NIGHT PRIOR TO YOUR SURGERY / PROCEDURE.   FOLLOW BOWEL PREP AND ANY ADDITIONAL PRE OP INSTRUCTIONS YOU RECEIVED FROM YOUR SURGEON'S OFFICE!!!   Oral Hygiene is also important to reduce your risk of infection.         Remember - BRUSH YOUR TEETH THE MORNING OF SURGERY WITH YOUR REGULAR TOOTHPASTE  Do NOT smoke after Midnight the night before surgery.  Take ONLY these medicines the morning of surgery with A SIP OF WATER: Omeprazole (Prilosec), amlodipine, Loratadine (Claritin),  Famotidine (Pepcid) ??am                    You may not have any metal on your body including jewelry, and body piercing  Do not wear lotions, powders,  cologne, or deodorant  Men may shave face and neck.  Contacts, Hearing Aids, dentures or bridgework may not be worn into surgery. DENTURES WILL BE REMOVED PRIOR TO SURGERY PLEASE DO NOT APPLY "Poly grip" OR ADHESIVES!!!   Patients discharged on the day of surgery will not be allowed to drive home.  Someone NEEDS to stay with you for the first 24 hours after anesthesia.  Do not bring your home medications to the hospital. The Pharmacy will dispense medications listed on your medication list to you during your admission in the Hospital.  Special Instructions: Bring a copy of your healthcare power of attorney and living will documents the day of surgery, if you wish to have them scanned into your [facility] Medical Records- EPIC  Please read over the following fact sheets you were given: IF YOU HAVE QUESTIONS ABOUT YOUR PRE-OP INSTRUCTIONS, PLEASE CALL [phone].   [facility] - Preparing for Surgery Before surgery,  you can play an important role.  Because skin is not sterile, your skin needs to be as free of germs as possible.  You can reduce the number of germs on your skin by washing with CHG (chlorahexidine gluconate) soap before surgery.  CHG is an antiseptic cleaner which kills germs and bonds with the skin to continue killing germs even after washing. Please DO NOT use if you have an allergy to CHG or antibacterial soaps.  If your skin becomes reddened/irritated stop using the CHG and inform your nurse when you arrive at Short Stay. Do not shave (including legs and  underarms) for at least 48 hours prior to the first CHG shower.  You may shave your face/neck.  Please follow these instructions carefully:  1.  Shower with CHG Soap the night before surgery and the  morning of surgery.  2.  If you choose to wash your hair, wash your hair first as usual with your normal  shampoo.  3.  After you shampoo, rinse your hair and body thoroughly to remove the shampoo.                             4.  Use CHG as you would any other liquid soap.  You can apply chg directly to the skin and wash.  Gently with a scrungie or clean washcloth.  5.  Apply the CHG Soap to your body ONLY FROM THE NECK DOWN.   Do not use on face/ open                           Wound or open sores. Avoid contact with eyes, ears mouth and genitals (private parts).                       Wash face,  Genitals (private parts) with your normal soap.             6.  Wash thoroughly, paying special attention to the area where your  surgery  will be performed.  7.  Thoroughly rinse your body with warm water from the neck down.  8.  DO NOT shower/wash with your normal soap after using and rinsing off the CHG Soap.            9.  Pat yourself dry with a clean towel.            10.  Wear clean pajamas.            11.  Place clean sheets on your bed the night of your first shower and do not  sleep with pets.  ON THE DAY OF SURGERY : Do not apply any lotions/deodorants the morning of surgery.  Please wear clean clothes to the hospital/surgery center.    FAILURE TO FOLLOW THESE INSTRUCTIONS MAY RESULT IN THE CANCELLATION OF YOUR SURGERY  PATIENT SIGNATURE_________________________________  NURSE SIGNATURE__________________________________  ________________________________________________________________________

## 2022-10-12 ENCOUNTER — Encounter (HOSPITAL_COMMUNITY): Payer: Self-pay

## 2022-10-12 ENCOUNTER — Other Ambulatory Visit: Payer: Self-pay

## 2022-10-12 ENCOUNTER — Encounter (HOSPITAL_COMMUNITY)
Admission: RE | Admit: 2022-10-12 | Discharge: 2022-10-12 | Disposition: A | Discharge status: Home / Self Care | Payer: Medicare Other | Source: Ambulatory Visit | Attending: Urology | Admitting: Urology

## 2022-10-12 VITALS — BP 119/75 | HR 78 | Temp 97.9°F | Resp 18 | Ht 66.0 in | Wt 171.0 lb

## 2022-10-12 DIAGNOSIS — I10 Essential (primary) hypertension: Secondary | ICD-10-CM | POA: Insufficient documentation

## 2022-10-12 DIAGNOSIS — Z01818 Encounter for other preprocedural examination: Secondary | ICD-10-CM | POA: Diagnosis not present

## 2022-10-12 HISTORY — DX: Chronic kidney disease, unspecified: N18.9

## 2022-10-12 LAB — CBC
HCT: 45.3 % (ref 39.0–52.0)
Hemoglobin: 15.3 g/dL (ref 13.0–17.0)
MCH: 27.9 pg (ref 26.0–34.0)
MCHC: 33.8 g/dL (ref 30.0–36.0)
MCV: 82.5 fL (ref 80.0–100.0)
Platelets: 187 10*3/uL (ref 150–400)
RBC: 5.49 MIL/uL (ref 4.22–5.81)
RDW: 13.5 % (ref 11.5–15.5)
WBC: 7.5 10*3/uL (ref 4.0–10.5)
nRBC: 0 % (ref 0.0–0.2)

## 2022-10-12 LAB — BASIC METABOLIC PANEL
Anion gap: 10 (ref 5–15)
BUN: 15 mg/dL (ref 8–23)
CO2: 26 mmol/L (ref 22–32)
Calcium: 9.4 mg/dL (ref 8.9–10.3)
Chloride: 103 mmol/L (ref 98–111)
Creatinine, Ser: 1.21 mg/dL (ref 0.61–1.24)
GFR, Estimated: >60 mL/min (ref >60)
Glucose, Bld: 97 mg/dL (ref 70–99)
Potassium: 4.7 mmol/L (ref 3.5–5.1)
Sodium: 139 mmol/L (ref 135–145)

## 2022-10-15 NOTE — H&P (Signed)
Office Visit Report     09/29/2022   --------------------------------------------------------------------------------   Derek Cardenas. [place]  MRN: [id number]  DOB: [birth year], 72 year old Male  SSN:    PRIMARY CARE:  Derek Gemma, PA  PRIMARY CARE FAX:  [phone]  REFERRING:  Derek Gauze, MD  PROVIDER:  Heloise Cardenas, M.D.  LOCATION:  [facility] [phone]     --------------------------------------------------------------------------------   CC/HPI: 1. Renal cell carcinoma  2. Lymph node positive prostate cancer  3. Hematuria   For full details, please see my note from 04/20/2022. Mr. Derek Cardenas presents today for new complaint of gross hematuria that began last Friday. He was seen in the office and was felt that he may have a urinary tract infection. However, his urine culture came back negative. He has been on Bactrim but has continued to have gross hematuria.     ALLERGIES: No Known Drug Allergies    MEDICATIONS: Bactrim Ds 800 mg-160 mg tablet 1 tablet PO BID  Diazepam 10 mg tablet Take 10 mg 30-60 minutes prior to your procedure  Levofloxacin 750 mg tablet Please take one tablet the morning of your biopsy.  Omeprazole  Sildenafil Citrate 100 mg tablet 1 tablet PO as directed PRN  Amlodipine Besylate 10 mg tablet     GU PSH: Laparoscopy; Lymphadenectomy - 06/01/2021 Locm 300-399Mg /Ml Iodine,1Ml - 09/22/2021 Partial nephrectomy (laparoscopic) - 2022 Prostate Needle Biopsy - 03/31/2021 Robotic Radical Prostatectomy - 06/01/2021     NON-GU PSH: Appendectomy (open) - 2022 Surgical Pathology, Gross And Microscopic Examination For Prostate Needle - 03/31/2021     GU PMH: Gross hematuria - 09/24/2022 Prostate Cancer - 04/20/2022, - 09/08/2021, - 09/08/2021, - 08/05/2021, - 07/14/2021, - 07/01/2021, - 06/16/2021, - 06/09/2021, - 05/13/2021, - 05/12/2021, - 04/14/2021 Renal cell carcinoma, right - 04/20/2022, - 09/22/2021, - 09/08/2021, - 10/28/2020 Elevated PSA -  10/06/2021, - 03/31/2021, - 02/18/2021 Prostate nodule w/o LUTS - 03/31/2021, - 02/18/2021 Right renal neoplasm - 2022      PMH Notes:   1) Renal cell carcinoma: He is s/p a right RAL partial nephrectomy on 09/29/20 for a 5.5 cm renal mass.   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 Properative PSMA PET scan indicated positive left obturator lymph nodes.   Diagnosis: pT3b N1 Mx, Gleason 5+4=9 adenocarcinoma with negative surgical margins (6/14 lymph nodes positive, all on left)  Pretreatment PSA: 6.32  Pretreatment SHIM score: 10 Jun 2021: Radical prostatectomy (lymph node positive disease)  May 2023: BCR (PSA 0.32)  Jun-Dec 2023: ADT  Jul-Aug 2023: Salvage radiation   NON-GU PMH: Muscle weakness (generalized) - 09/08/2021, - 08/05/2021, - 07/14/2021, - 07/01/2021, - 06/16/2021 Other lack of coordination - 09/08/2021, - 08/05/2021, - 07/14/2021, - 07/01/2021, - 06/16/2021 Diverticulitis GERD Hypertension    FAMILY HISTORY: 1 Daughter - Runs in Family 2 sons - Runs in Family Congestive Heart Failure - Father skin cancer - Mother stroke - Mother   SOCIAL HISTORY: Marital Status: Married Preferred Language: English; Ethnicity: Not Hispanic Or Latino; Race: White Current Smoking Status: Patient has never smoked.   Tobacco Use Assessment Completed: Used Tobacco in last 30 days? Has never drank.  Does not use drugs. Drinks 1 caffeinated drink per day. Patient's occupation is/was Retired.    REVIEW OF  SYSTEMS:    GU Review Male:   Patient denies frequent urination, hard to postpone urination, burning/ pain with urination, get up at night to urinate, leakage of urine, stream starts and stops, trouble starting your streams, and have to strain to urinate .  Gastrointestinal (Upper):   Patient denies  nausea and vomiting.  Gastrointestinal (Lower):   Patient denies diarrhea and constipation.  Constitutional:   Patient denies fever, night sweats, weight loss, and fatigue.  Skin:   Patient denies skin rash/ lesion and itching.  Eyes:   Patient denies blurred vision and double vision.  Ears/ Nose/ Throat:   Patient denies sore throat and sinus problems.  Hematologic/Lymphatic:   Patient denies swollen glands and easy bruising.  Cardiovascular:   Patient denies leg swelling and chest pains.  Respiratory:   Patient denies cough and shortness of breath.  Endocrine:   Patient denies excessive thirst.  Musculoskeletal:   Patient denies back pain and joint pain.  Neurological:   Patient denies headaches and dizziness.  Psychologic:   Patient denies depression and anxiety.   VITAL SIGNS:      09/29/2022 02:29 PM  Weight 173 lb / 78.47 kg  Height 66 in / 167.64 cm  BP 96/64 mmHg  Pulse 78 /min  Temperature 97.3 F / 36.2 C  BMI 27.9 kg/m   GU PHYSICAL EXAMINATION:    Urethral Meatus: Normal size. No lesion, no wart, no discharge, no polyp. Normal location.   MULTI-SYSTEM PHYSICAL EXAMINATION:    Constitutional: Well-nourished. No physical deformities. Normally developed. Good grooming.     Complexity of Data:  Records Review:   Previous Patient Records   04/13/22 09/01/21  PSA  Total PSA <0.015 ng/mL 0.32 ng/mL    PROCEDURES:         Flexible Cystoscopy - 52000  Indication: Gross hematuria Risks, benefits, and potential complications of the procedure were discussed with the patient including infection, bleeding, voiding discomfort, urinary retention, fever, chills, sepsis, and others. All questions were answered. Informed consent was obtained. Sterile technique and intraurethral analgesia were used.  Meatus:  Normal size. Normal location. Normal condition.  Urethra:  No strictures.  External Sphincter:  Normal.  Verumontanum:  Verumontanum Surgically Absent.  Prostate:  Prostate  Surgically Absent.  Bladder Neck:  Non-obstructing.  Ureteral Orifices:  Normal location. Normal size. Normal shape. Effluxed clear urine.  Bladder:  A systematic examination of the bladder was performed. This revealed significant edematous radiation changes along the trigone of the bladder. There were no active bleeding sites. No papillary tumors. A bladder washing was obtained for cytology.      Chaperone: TP The procedure was well-tolerated and without complications. Instructions were given to call the office immediately if questions or problems.         Urinalysis w/Scope Dipstick Dipstick Cont'd Micro  Color: Amber Bilirubin: Neg mg/dL WBC/hpf: 0 - 5/hpf  Appearance: Clear Ketones: Neg mg/dL RBC/hpf: 3 - 16/XWR  Specific Gravity: 1.020 Blood: 3+ ery/uL Bacteria: Few (10-25/hpf)  pH: 5.5 Protein: Trace mg/dL Cystals: Uric Acid  Glucose: Neg mg/dL Urobilinogen: 0.2 mg/dL Casts: NS (Not Seen)    Nitrites: Neg Trichomonas: Not Present    Leukocyte Esterase: Neg leu/uL Mucous: Present      Epithelial Cells: NS (Not Seen)      Yeast: NS (Not Seen)      Sperm: Not Present    ASSESSMENT:      ICD-10 Details  1 GU:   Prostate Cancer - C61  2   Gross hematuria - R31.0    PLAN:           Orders Labs Urine Cytology  Lab Notes: Washing          Schedule Return Visit/Planned Activity: Keep Scheduled Appointment          Document Letter(s):  Created for Patient: Clinical Summary         Notes:   1. Hematuria: His cystoscopic findings are consistent with radiation changes. A bladder washing has been obtained for cytology. If this is concerning, we will proceed with biopsy. Otherwise, we will continue with observation and he will monitor the situation and continue to stay hydrated. He otherwise will keep his scheduled appointment for surveillance of his kidney cancer and prostate cancer later this summer.   CC: Derek Gemma, PA-C        Next Appointment:      Next Appointment:  12/01/2022 10:00 AM    Appointment Type: Laboratory Appointment    Location: [facility] [phone]    Provider: Lab LAB    Reason for Visit: 6 mo CMP/ Total T/ PSA GIVE NEW CHEST X-RAY W/ CONTRAST ORDER      * Signed by Derek Cardenas, M.D. on 09/29/22 at 4:22 PM (EDT)*     APPENDED NOTES:    I spoke with Mr. Ancheta today and reviewed his cytology results that were atypical. Considering his cystoscopic findings along with his atypical cytology, I have recommended that we proceed with cystoscopy and transurethral resection versus biopsy of his abnormal appearing bladder tissue. While this is likely related to radiation induced changes, with his atypical cytology, I think we are obligated to absolutely rule out malignancy. This will be scheduled for the near future. We have reviewed the potential risks and complications as well as the expected recovery process. He gives informed consent to proceed.    * Signed by Derek Cardenas, M.D. on 10/01/22 at 12:11 PM (EDT)*

## 2022-10-18 ENCOUNTER — Ambulatory Visit (HOSPITAL_COMMUNITY): Payer: Medicare Other | Admitting: Physician Assistant

## 2022-10-18 ENCOUNTER — Encounter (HOSPITAL_COMMUNITY): Payer: Self-pay | Admitting: Urology

## 2022-10-18 ENCOUNTER — Encounter (HOSPITAL_COMMUNITY)
Admission: RE | Disposition: A | Discharge status: Home / Self Care | Payer: Self-pay | Source: Ambulatory Visit | Attending: Urology

## 2022-10-18 ENCOUNTER — Ambulatory Visit (HOSPITAL_BASED_OUTPATIENT_CLINIC_OR_DEPARTMENT_OTHER): Payer: Medicare Other | Admitting: Anesthesiology

## 2022-10-18 ENCOUNTER — Ambulatory Visit (HOSPITAL_COMMUNITY)
Admission: RE | Admit: 2022-10-18 | Discharge: 2022-10-18 | Disposition: A | Discharge status: Home / Self Care | Payer: Medicare Other | Source: Ambulatory Visit | Attending: Urology | Admitting: Urology

## 2022-10-18 DIAGNOSIS — Z85528 Personal history of other malignant neoplasm of kidney: Secondary | ICD-10-CM | POA: Diagnosis not present

## 2022-10-18 DIAGNOSIS — N329 Bladder disorder, unspecified: Secondary | ICD-10-CM | POA: Diagnosis not present

## 2022-10-18 DIAGNOSIS — Z9079 Acquired absence of other genital organ(s): Secondary | ICD-10-CM | POA: Diagnosis not present

## 2022-10-18 DIAGNOSIS — Z923 Personal history of irradiation: Secondary | ICD-10-CM | POA: Diagnosis not present

## 2022-10-18 DIAGNOSIS — Z87891 Personal history of nicotine dependence: Secondary | ICD-10-CM | POA: Insufficient documentation

## 2022-10-18 DIAGNOSIS — K219 Gastro-esophageal reflux disease without esophagitis: Secondary | ICD-10-CM | POA: Insufficient documentation

## 2022-10-18 DIAGNOSIS — N3001 Acute cystitis with hematuria: Secondary | ICD-10-CM | POA: Insufficient documentation

## 2022-10-18 DIAGNOSIS — I10 Essential (primary) hypertension: Secondary | ICD-10-CM | POA: Insufficient documentation

## 2022-10-18 DIAGNOSIS — N3289 Other specified disorders of bladder: Secondary | ICD-10-CM | POA: Diagnosis present

## 2022-10-18 DIAGNOSIS — Z8589 Personal history of malignant neoplasm of other organs and systems: Secondary | ICD-10-CM | POA: Diagnosis not present

## 2022-10-18 DIAGNOSIS — N3021 Other chronic cystitis with hematuria: Secondary | ICD-10-CM | POA: Insufficient documentation

## 2022-10-18 DIAGNOSIS — Z8546 Personal history of malignant neoplasm of prostate: Secondary | ICD-10-CM | POA: Diagnosis not present

## 2022-10-18 DIAGNOSIS — Z79899 Other long term (current) drug therapy: Secondary | ICD-10-CM | POA: Diagnosis not present

## 2022-10-18 HISTORY — PX: CYSTOSCOPY: SHX5120

## 2022-10-18 HISTORY — PX: TRANSURETHRAL RESECTION OF BLADDER TUMOR: SHX2575

## 2022-10-18 SURGERY — TURBT (TRANSURETHRAL RESECTION OF BLADDER TUMOR)
Anesthesia: General

## 2022-10-18 MED ORDER — FENTANYL CITRATE (PF) 100 MCG/2ML IJ SOLN
INTRAMUSCULAR | Status: DC | PRN
  Administered 2022-10-18: 50 ug via INTRAVENOUS
  Administered 2022-10-18 (×2): 25 ug via INTRAVENOUS

## 2022-10-18 MED ORDER — PROPOFOL 10 MG/ML IV BOLUS
INTRAVENOUS | Status: DC | PRN
  Administered 2022-10-18: 150 mg via INTRAVENOUS

## 2022-10-18 MED ORDER — OXYCODONE HCL 5 MG PO TABS: 5.0000 mg | ORAL_TABLET | Freq: Once | ORAL | Status: DC | PRN

## 2022-10-18 MED ORDER — LIDOCAINE HCL (CARDIAC) PF 100 MG/5ML IV SOSY
PREFILLED_SYRINGE | INTRAVENOUS | Status: DC | PRN
  Administered 2022-10-18: 60 mg via INTRAVENOUS

## 2022-10-18 MED ORDER — CHLORHEXIDINE GLUCONATE 0.12 % MT SOLN
15.0000 mL | Freq: Once | OROMUCOSAL | Status: AC
  Administered 2022-10-18: 15 mL via OROMUCOSAL

## 2022-10-18 MED ORDER — ACETAMINOPHEN 500 MG PO TABS: 1000.0000 mg | ORAL_TABLET | Freq: Once | ORAL | Status: DC

## 2022-10-18 MED ORDER — FENTANYL CITRATE (PF) 100 MCG/2ML IJ SOLN
INTRAMUSCULAR | Status: AC
  Filled 2022-10-18: qty 2

## 2022-10-18 MED ORDER — 0.9 % SODIUM CHLORIDE (POUR BTL) OPTIME
TOPICAL | Status: DC | PRN
  Administered 2022-10-18: 1000 mL

## 2022-10-18 MED ORDER — LIDOCAINE HCL (PF) 2 % IJ SOLN
INTRAMUSCULAR | Status: AC
  Filled 2022-10-18: qty 5

## 2022-10-18 MED ORDER — ORAL CARE MOUTH RINSE: 15.0000 mL | Freq: Once | OROMUCOSAL | Status: AC

## 2022-10-18 MED ORDER — LACTATED RINGERS IV SOLN: INTRAVENOUS | Status: DC

## 2022-10-18 MED ORDER — PHENAZOPYRIDINE HCL 200 MG PO TABS: 200.0000 mg | ORAL_TABLET | Freq: Three times a day (TID) | ORAL | 0 refills | Status: DC | PRN

## 2022-10-18 MED ORDER — ONDANSETRON HCL 4 MG/2ML IJ SOLN: 4.0000 mg | Freq: Once | INTRAMUSCULAR | Status: DC | PRN

## 2022-10-18 MED ORDER — OXYCODONE HCL 5 MG/5ML PO SOLN: 5.0000 mg | Freq: Once | ORAL | Status: DC | PRN

## 2022-10-18 MED ORDER — CEFAZOLIN SODIUM-DEXTROSE 2-4 GM/100ML-% IV SOLN
2.0000 g | INTRAVENOUS | Status: AC
  Administered 2022-10-18: 2 g via INTRAVENOUS
  Filled 2022-10-18: qty 100

## 2022-10-18 MED ORDER — HYDROMORPHONE HCL 1 MG/ML IJ SOLN: 0.2500 mg | INTRAMUSCULAR | Status: DC | PRN

## 2022-10-18 MED ORDER — PROPOFOL 10 MG/ML IV BOLUS
INTRAVENOUS | Status: AC
  Filled 2022-10-18: qty 20

## 2022-10-18 MED ORDER — ACETAMINOPHEN 500 MG PO TABS
1000.0000 mg | ORAL_TABLET | Freq: Once | ORAL | Status: AC
  Administered 2022-10-18: 1000 mg via ORAL
  Filled 2022-10-18: qty 2

## 2022-10-18 MED ORDER — ONDANSETRON HCL 4 MG/2ML IJ SOLN
INTRAMUSCULAR | Status: AC
  Filled 2022-10-18: qty 2

## 2022-10-18 MED ORDER — ROCURONIUM BROMIDE 10 MG/ML (PF) SYRINGE
PREFILLED_SYRINGE | INTRAVENOUS | Status: AC
  Filled 2022-10-18: qty 10

## 2022-10-18 MED ORDER — AMISULPRIDE (ANTIEMETIC) 5 MG/2ML IV SOLN: 10.0000 mg | Freq: Once | INTRAVENOUS | Status: DC | PRN

## 2022-10-18 MED ORDER — DEXAMETHASONE SODIUM PHOSPHATE 10 MG/ML IJ SOLN
INTRAMUSCULAR | Status: AC
  Filled 2022-10-18: qty 1

## 2022-10-18 MED ORDER — ONDANSETRON HCL 4 MG/2ML IJ SOLN
INTRAMUSCULAR | Status: DC | PRN
  Administered 2022-10-18: 4 mg via INTRAVENOUS

## 2022-10-18 MED ORDER — SODIUM CHLORIDE 0.9 % IR SOLN
Status: DC | PRN
  Administered 2022-10-18: 3000 mL via INTRAVESICAL

## 2022-10-18 SURGICAL SUPPLY — 21 items
BAG DRN RND TRDRP ANRFLXCHMBR (UROLOGICAL SUPPLIES)
BAG URINE DRAIN 2000ML AR STRL (UROLOGICAL SUPPLIES) IMPLANT
BAG URO CATCHER STRL LF (MISCELLANEOUS) ×2 IMPLANT
CATH URETL OPEN END 6FR 70 (CATHETERS) IMPLANT
CLOTH BEACON ORANGE TIMEOUT ST (SAFETY) ×2 IMPLANT
DRAPE FOOT SWITCH (DRAPES) ×2 IMPLANT
ELECT REM PT RETURN 15FT ADLT (MISCELLANEOUS) ×2 IMPLANT
GLOVE SURG LX STRL 7.5 STRW (GLOVE) ×2 IMPLANT
GOWN STRL REUS W/ TWL XL LVL3 (GOWN DISPOSABLE) ×2 IMPLANT
GOWN STRL REUS W/TWL XL LVL3 (GOWN DISPOSABLE) ×1
GUIDEWIRE STR DUAL SENSOR (WIRE) ×2 IMPLANT
GUIDEWIRE ZIPWRE .038 STRAIGHT (WIRE) IMPLANT
KIT TURNOVER KIT A (KITS) IMPLANT
LOOP CUT BIPOLAR 24F LRG (ELECTROSURGICAL) IMPLANT
MANIFOLD NEPTUNE II (INSTRUMENTS) ×2 IMPLANT
PACK CYSTO (CUSTOM PROCEDURE TRAY) ×2 IMPLANT
PENCIL SMOKE EVACUATOR (MISCELLANEOUS) IMPLANT
SYR TOOMEY IRRIG 70ML (MISCELLANEOUS)
SYRINGE TOOMEY IRRIG 70ML (MISCELLANEOUS) IMPLANT
TUBING CONNECTING 10 (TUBING) ×2 IMPLANT
TUBING UROLOGY SET (TUBING) ×2 IMPLANT

## 2022-10-18 NOTE — Discharge Instructions (Signed)
You may see some blood in the urine and may have some burning with urination for 48-72 hours. You also may notice that you have to urinate more frequently or urgently after your procedure which is normal.  You should call should you develop an inability urinate, fever > 101, persistent nausea and vomiting that prevents you from eating or drinking to stay hydrated.    

## 2022-10-18 NOTE — Interval H&P Note (Signed)
History and Physical Interval Note:  10/18/2022 9:08 AM  [place]  has presented today for surgery, with the diagnosis of HEMATURIA, BLADDER MASS.  The various methods of treatment have been discussed with the patient and family. After consideration of risks, benefits and other options for treatment, the patient has consented to  Procedure(s) with comments: TRANSURETHRAL RESECTION OF BLADDER TUMOR (TURBT) (N/A) - 45 MINUTES NEEDED FOR CASE CYSTOSCOPY (N/A) as a surgical intervention.  The patient's history has been reviewed, patient examined, no change in status, stable for surgery.  I have reviewed the patient's chart and labs.  Questions were answered to the patient's satisfaction.     [employer]

## 2022-10-18 NOTE — Transfer of Care (Signed)
Immediate Anesthesia Transfer of Care Note  Patient: Derek Cardenas  Procedure(s) Performed: TRANSURETHRAL RESECTION OF BLADDER TUMOR (TURBT) CYSTOSCOPY  Patient Location: PACU  Anesthesia Type:General  Level of Consciousness: drowsy  Airway & Oxygen Therapy: Patient Spontanous Breathing and Patient connected to face mask oxygen  Post-op Assessment: Report given to RN and Post -op Vital signs reviewed and stable  Post vital signs: Reviewed and stable  Last Vitals:  Vitals Value Taken Time  BP 153/92 10/18/22 1147  Temp    Pulse 71 10/18/22 1147  Resp 13 10/18/22 1147  SpO2 100 % 10/18/22 1147  Vitals shown include unvalidated device data.  Last Pain:  Vitals:   10/18/22 0919  TempSrc: Oral  PainSc: 0-No pain         Complications: No notable events documented.

## 2022-10-18 NOTE — Anesthesia Preprocedure Evaluation (Addendum)
Anesthesia Evaluation  Patient identified by MRN, date of birth, ID band Patient awake    Reviewed: Allergy & Precautions, NPO status , Patient's Chart, lab work & pertinent test results  Airway Mallampati: III  TM Distance: >3 FB Neck ROM: Full    Dental  (+) Teeth Intact, Dental Advisory Given   Pulmonary former smoker   Pulmonary exam normal breath sounds clear to auscultation       Cardiovascular hypertension (133/86 preop), Pt. on medications Normal cardiovascular exam Rhythm:Regular Rate:Normal     Neuro/Psych negative neurological ROS  negative psych ROS   GI/Hepatic Neg liver ROS,GERD  Controlled and Medicated,,  Endo/Other  negative endocrine ROS    Renal/GU negative Renal ROS Bladder dysfunction      Musculoskeletal negative musculoskeletal ROS (+)    Abdominal   Peds  Hematology negative hematology ROS (+)   Anesthesia Other Findings   Reproductive/Obstetrics negative OB ROS                             Anesthesia Physical Anesthesia Plan  ASA: 2  Anesthesia Plan: General   Post-op Pain Management: Tylenol PO (pre-op)*   Induction: Intravenous  PONV Risk Score and Plan: 3 and Ondansetron, Dexamethasone and Treatment may vary due to age or medical condition  Airway Management Planned: Oral ETT  Additional Equipment: None  Intra-op Plan:   Post-operative Plan: Extubation in OR  Informed Consent: I have reviewed the patients History and Physical, chart, labs and discussed the procedure including the risks, benefits and alternatives for the proposed anesthesia with the patient or authorized representative who has indicated his/her understanding and acceptance.     Dental advisory given  Plan Discussed with: CRNA  Anesthesia Plan Comments:         Anesthesia Quick Evaluation

## 2022-10-18 NOTE — Anesthesia Procedure Notes (Signed)
Procedure Name: LMA Insertion Date/Time: 10/18/2022 11:21 AM  Performed by: Orest Dikes, CRNAPre-anesthesia Checklist: Patient identified, Emergency Drugs available, Suction available and Patient being monitored Oxygen Delivery Method: Circle system utilized Preoxygenation: Pre-oxygenation with 100% oxygen Induction Type: IV induction LMA: LMA with gastric port inserted LMA Size: 4.0 Number of attempts: 1 Placement Confirmation: positive ETCO2 and breath sounds checked- equal and bilateral Tube secured with: Tape Dental Injury: Teeth and Oropharynx as per pre-operative assessment

## 2022-10-18 NOTE — Op Note (Signed)
Preoperative diagnosis: Bladder lesion (2.5 cm)  Postoperative diagnosis: Bladder lesion (2.5 cm)  Procedures: 1.  Cystoscopy 2.  Transurethral resection of bladder lesion (2.5 cm)  Surgeon: Moody Bruins MD  Anesthesia: General  Complications: None  EBL: Minimal  Specimens: Bladder biopsies  Indication: Mr. Derek Cardenas is a 72 year old gentleman with a history of prostate cancer status post radical prostatectomy and subsequent salvage radiation therapy completed last summer.  He recently developed gross hematuria and underwent a full evaluation.  On cystoscopy, he was noted to have some erythematous changes along the bladder just posterior to the trigone measuring approximately 2.5 cm.  This was not particularly suspicious for malignancy visually.  However, his cytology returned and did demonstrate atypical results.  He therefore presents today for definitive evaluation with cystoscopy and transurethral biopsies.  The potential risks, complications, and the expected recovery process associated with the above procedures were discussed in detail.  Informed consent was obtained.  Description of procedure: The patient was taken the operating room and a general anesthetic was administered.  He was given preoperative antibiotics, placed in the dorsolithotomy position, and prepped and draped in usual sterile fashion.  Next, a preoperative timeout was performed.  Cystourethroscopy was performed with a 22 French cystoscope which revealed the previously noted erythematous changes along the posterior bladder just beyond the trigone.  There were no papillary tumors or other highly suspicious areas for malignancy visually.  The urethra was then serially dilated with Sissy Hoff sounds from 24 [place] up to 30 [place].  The 26 French resectoscope sheath was then inserted into the bladder under direct vision with the visual obturator.  Using bipolar transurethral resection, multiple areas of tissue were resected  for definitive biopsies.  This area measured 2.5 cm in total.  Hemostasis was achieved with bipolar cautery.  The bladder was emptied and reinspected and there appeared to be excellent hemostasis.  The ureteral orifice ease were in their expected location and were well away from the resection site.  The patient tolerated the procedure well without complications.  He was able to be awakened and transferred to the recovery unit in satisfactory condition.

## 2022-10-18 NOTE — Anesthesia Postprocedure Evaluation (Signed)
Anesthesia Post Note  Patient: Derek Cardenas  Procedure(s) Performed: TRANSURETHRAL RESECTION OF BLADDER TUMOR (TURBT) CYSTOSCOPY     Patient location during evaluation: PACU Anesthesia Type: General Level of consciousness: awake and alert, oriented and patient cooperative Pain management: pain level controlled Vital Signs Assessment: post-procedure vital signs reviewed and stable Respiratory status: spontaneous breathing, nonlabored ventilation and respiratory function stable Cardiovascular status: blood pressure returned to baseline and stable Postop Assessment: no apparent nausea or vomiting Anesthetic complications: no   No notable events documented.  Last Vitals:  Vitals:   10/18/22 1200 10/18/22 1228  BP: (!) 156/96 125/86  Pulse: 84 86  Resp: 14 14  Temp:  36.6 C  SpO2: 94% 97%    Last Pain:  Vitals:   10/18/22 1228  TempSrc:   PainSc: 0-No pain                 Lannie Fields

## 2022-10-19 ENCOUNTER — Encounter (HOSPITAL_COMMUNITY): Payer: Self-pay | Admitting: Urology

## 2022-10-19 LAB — SURGICAL PATHOLOGY

## 2023-06-16 ENCOUNTER — Emergency Department (HOSPITAL_BASED_OUTPATIENT_CLINIC_OR_DEPARTMENT_OTHER): Payer: Medicare Other | Admitting: Radiology

## 2023-06-16 DIAGNOSIS — K219 Gastro-esophageal reflux disease without esophagitis: Secondary | ICD-10-CM | POA: Diagnosis present

## 2023-06-16 DIAGNOSIS — K819 Cholecystitis, unspecified: Secondary | ICD-10-CM | POA: Diagnosis not present

## 2023-06-16 DIAGNOSIS — Z85528 Personal history of other malignant neoplasm of kidney: Secondary | ICD-10-CM

## 2023-06-16 DIAGNOSIS — Z87891 Personal history of nicotine dependence: Secondary | ICD-10-CM

## 2023-06-16 DIAGNOSIS — K8050 Calculus of bile duct without cholangitis or cholecystitis without obstruction: Principal | ICD-10-CM | POA: Diagnosis present

## 2023-06-16 DIAGNOSIS — K5732 Diverticulitis of large intestine without perforation or abscess without bleeding: Secondary | ICD-10-CM | POA: Diagnosis present

## 2023-06-16 DIAGNOSIS — I129 Hypertensive chronic kidney disease with stage 1 through stage 4 chronic kidney disease, or unspecified chronic kidney disease: Secondary | ICD-10-CM | POA: Diagnosis present

## 2023-06-16 DIAGNOSIS — Z808 Family history of malignant neoplasm of other organs or systems: Secondary | ICD-10-CM

## 2023-06-16 DIAGNOSIS — Z9049 Acquired absence of other specified parts of digestive tract: Secondary | ICD-10-CM

## 2023-06-16 DIAGNOSIS — Z905 Acquired absence of kidney: Secondary | ICD-10-CM

## 2023-06-16 DIAGNOSIS — Z79899 Other long term (current) drug therapy: Secondary | ICD-10-CM

## 2023-06-16 LAB — COMPREHENSIVE METABOLIC PANEL
ALT: 20 U/L (ref 0–44)
AST: 19 U/L (ref 15–41)
Albumin: 4.9 g/dL (ref 3.5–5.0)
Alkaline Phosphatase: 106 U/L (ref 38–126)
Anion gap: 11 (ref 5–15)
BUN: 20 mg/dL (ref 8–23)
CO2: 21 mmol/L — ABNORMAL LOW (ref 22–32)
Calcium: 9.2 mg/dL (ref 8.9–10.3)
Chloride: 105 mmol/L (ref 98–111)
Creatinine, Ser: 1 mg/dL (ref 0.61–1.24)
GFR, Estimated: >60 mL/min (ref >60)
Glucose, Bld: 155 mg/dL — ABNORMAL HIGH (ref 70–99)
Potassium: 3.8 mmol/L (ref 3.5–5.1)
Sodium: 137 mmol/L (ref 135–145)
Total Bilirubin: 0.9 mg/dL (ref 0.0–1.2)
Total Protein: 7.4 g/dL (ref 6.5–8.1)

## 2023-06-16 LAB — TROPONIN I (HIGH SENSITIVITY): Troponin I (High Sensitivity): 2 ng/L (ref <18)

## 2023-06-16 LAB — CBC
HCT: 45.3 % (ref 39.0–52.0)
Hemoglobin: 15.6 g/dL (ref 13.0–17.0)
MCH: 27 pg (ref 26.0–34.0)
MCHC: 34.4 g/dL (ref 30.0–36.0)
MCV: 78.5 fL — ABNORMAL LOW (ref 80.0–100.0)
Platelets: 206 10*3/uL (ref 150–400)
RBC: 5.77 MIL/uL (ref 4.22–5.81)
RDW: 14.1 % (ref 11.5–15.5)
WBC: 16.4 10*3/uL — ABNORMAL HIGH (ref 4.0–10.5)
nRBC: 0 % (ref 0.0–0.2)

## 2023-06-16 LAB — LIPASE, BLOOD: Lipase: 32 U/L (ref 11–51)

## 2023-06-16 NOTE — ED Notes (Signed)
 Patient aware that we need urine sample for testing, unable at this time. Pt given instruction on providing urine sample when able to do so. --Pt states he just urinated prior to coming to triage for reassessment

## 2023-06-16 NOTE — ED Triage Notes (Addendum)
 Epigastric pain since this AM. Rest and Tylenol seemed to help temporarily. Belching. Denies new urinary symptoms, no new stool changes.  HX multiple abd surgeries Kidney cancer Ruptured app Prostate cancer Hernia repair.

## 2023-06-17 ENCOUNTER — Other Ambulatory Visit: Payer: Self-pay

## 2023-06-17 ENCOUNTER — Inpatient Hospital Stay (HOSPITAL_BASED_OUTPATIENT_CLINIC_OR_DEPARTMENT_OTHER)
Admission: EM | Admit: 2023-06-17 | Discharge: 2023-06-20 | DRG: 445 | Disposition: A | Discharge status: Home / Self Care | Payer: Medicare Other | Attending: Surgery | Admitting: Surgery

## 2023-06-17 ENCOUNTER — Observation Stay (HOSPITAL_COMMUNITY): Payer: Medicare Other

## 2023-06-17 ENCOUNTER — Emergency Department (HOSPITAL_BASED_OUTPATIENT_CLINIC_OR_DEPARTMENT_OTHER): Payer: Medicare Other

## 2023-06-17 ENCOUNTER — Encounter (HOSPITAL_COMMUNITY): Payer: Self-pay

## 2023-06-17 DIAGNOSIS — Z808 Family history of malignant neoplasm of other organs or systems: Secondary | ICD-10-CM | POA: Diagnosis not present

## 2023-06-17 DIAGNOSIS — Z9049 Acquired absence of other specified parts of digestive tract: Secondary | ICD-10-CM | POA: Diagnosis not present

## 2023-06-17 DIAGNOSIS — Z85528 Personal history of other malignant neoplasm of kidney: Secondary | ICD-10-CM | POA: Diagnosis not present

## 2023-06-17 DIAGNOSIS — K819 Cholecystitis, unspecified: Principal | ICD-10-CM

## 2023-06-17 DIAGNOSIS — K219 Gastro-esophageal reflux disease without esophagitis: Secondary | ICD-10-CM | POA: Diagnosis not present

## 2023-06-17 DIAGNOSIS — K5732 Diverticulitis of large intestine without perforation or abscess without bleeding: Secondary | ICD-10-CM | POA: Diagnosis not present

## 2023-06-17 DIAGNOSIS — Z87891 Personal history of nicotine dependence: Secondary | ICD-10-CM | POA: Diagnosis not present

## 2023-06-17 DIAGNOSIS — Z79899 Other long term (current) drug therapy: Secondary | ICD-10-CM | POA: Diagnosis not present

## 2023-06-17 DIAGNOSIS — K8010 Calculus of gallbladder with chronic cholecystitis without obstruction: Secondary | ICD-10-CM | POA: Diagnosis present

## 2023-06-17 DIAGNOSIS — K8050 Calculus of bile duct without cholangitis or cholecystitis without obstruction: Secondary | ICD-10-CM | POA: Diagnosis not present

## 2023-06-17 DIAGNOSIS — Z905 Acquired absence of kidney: Secondary | ICD-10-CM | POA: Diagnosis not present

## 2023-06-17 DIAGNOSIS — I129 Hypertensive chronic kidney disease with stage 1 through stage 4 chronic kidney disease, or unspecified chronic kidney disease: Secondary | ICD-10-CM | POA: Diagnosis not present

## 2023-06-17 LAB — URINALYSIS, ROUTINE W REFLEX MICROSCOPIC
Bilirubin Urine: NEGATIVE
Glucose, UA: NEGATIVE mg/dL
Hgb urine dipstick: NEGATIVE
Ketones, ur: NEGATIVE mg/dL
Leukocytes,Ua: NEGATIVE
Nitrite: NEGATIVE
Specific Gravity, Urine: >1.046 — ABNORMAL HIGH (ref 1.005–1.030)
pH: 6.5 (ref 5.0–8.0)

## 2023-06-17 LAB — TROPONIN I (HIGH SENSITIVITY): Troponin I (High Sensitivity): 2 ng/L (ref <18)

## 2023-06-17 MED ORDER — MORPHINE SULFATE (PF) 2 MG/ML IV SOLN: 2.0000 mg | INTRAVENOUS | Status: DC | PRN

## 2023-06-17 MED ORDER — PIPERACILLIN-TAZOBACTAM 3.375 G IVPB
3.3750 g | Freq: Three times a day (TID) | INTRAVENOUS | Status: DC
  Administered 2023-06-17 – 2023-06-19 (×6): 3.375 g via INTRAVENOUS
  Filled 2023-06-17 (×7): qty 50

## 2023-06-17 MED ORDER — SODIUM CHLORIDE 0.9 % IV BOLUS
1000.0000 mL | Freq: Once | INTRAVENOUS | Status: AC
  Administered 2023-06-17: 1000 mL via INTRAVENOUS

## 2023-06-17 MED ORDER — LACTATED RINGERS IV SOLN: INTRAVENOUS | Status: AC

## 2023-06-17 MED ORDER — MORPHINE SULFATE (PF) 4 MG/ML IV SOLN
4.0000 mg | Freq: Once | INTRAVENOUS | Status: AC
  Administered 2023-06-17: 4 mg via INTRAVENOUS
  Filled 2023-06-17: qty 1

## 2023-06-17 MED ORDER — ACETAMINOPHEN 325 MG PO TABS: 650.0000 mg | ORAL_TABLET | Freq: Four times a day (QID) | ORAL | Status: DC | PRN

## 2023-06-17 MED ORDER — LORATADINE 10 MG PO TABS
10.0000 mg | ORAL_TABLET | Freq: Every day | ORAL | Status: DC
  Administered 2023-06-18 – 2023-06-20 (×3): 10 mg via ORAL
  Filled 2023-06-17 (×3): qty 1

## 2023-06-17 MED ORDER — ONDANSETRON 4 MG PO TBDP: 4.0000 mg | ORAL_TABLET | Freq: Four times a day (QID) | ORAL | Status: DC | PRN

## 2023-06-17 MED ORDER — ENOXAPARIN SODIUM 40 MG/0.4ML IJ SOSY
40.0000 mg | PREFILLED_SYRINGE | INTRAMUSCULAR | Status: DC
  Administered 2023-06-17 – 2023-06-19 (×3): 40 mg via SUBCUTANEOUS
  Filled 2023-06-17 (×3): qty 0.4

## 2023-06-17 MED ORDER — ONDANSETRON HCL 4 MG/2ML IJ SOLN
4.0000 mg | Freq: Once | INTRAMUSCULAR | Status: AC
  Administered 2023-06-17: 4 mg via INTRAVENOUS
  Filled 2023-06-17: qty 2

## 2023-06-17 MED ORDER — FAMOTIDINE 20 MG PO TABS
20.0000 mg | ORAL_TABLET | Freq: Every day | ORAL | Status: DC
  Administered 2023-06-18 – 2023-06-20 (×3): 20 mg via ORAL
  Filled 2023-06-17 (×3): qty 1

## 2023-06-17 MED ORDER — METOPROLOL TARTRATE 5 MG/5ML IV SOLN: 5.0000 mg | Freq: Four times a day (QID) | INTRAVENOUS | Status: DC | PRN

## 2023-06-17 MED ORDER — DIPHENHYDRAMINE HCL 50 MG/ML IJ SOLN: 12.5000 mg | Freq: Four times a day (QID) | INTRAMUSCULAR | Status: DC | PRN

## 2023-06-17 MED ORDER — ONDANSETRON HCL 4 MG/2ML IJ SOLN
4.0000 mg | Freq: Four times a day (QID) | INTRAMUSCULAR | Status: DC | PRN
  Filled 2023-06-17: qty 2

## 2023-06-17 MED ORDER — IOHEXOL 300 MG/ML  SOLN
100.0000 mL | Freq: Once | INTRAMUSCULAR | Status: AC | PRN
  Administered 2023-06-17: 85 mL via INTRAVENOUS

## 2023-06-17 MED ORDER — AMLODIPINE BESYLATE 5 MG PO TABS
5.0000 mg | ORAL_TABLET | Freq: Every day | ORAL | Status: DC
  Administered 2023-06-18 – 2023-06-20 (×3): 5 mg via ORAL
  Filled 2023-06-17 (×3): qty 1

## 2023-06-17 MED ORDER — HYDRALAZINE HCL 20 MG/ML IJ SOLN: 10.0000 mg | INTRAMUSCULAR | Status: DC | PRN

## 2023-06-17 MED ORDER — PHENAZOPYRIDINE HCL 200 MG PO TABS: 200.0000 mg | ORAL_TABLET | Freq: Three times a day (TID) | ORAL | Status: DC | PRN

## 2023-06-17 MED ORDER — ACETAMINOPHEN 650 MG RE SUPP: 650.0000 mg | Freq: Four times a day (QID) | RECTAL | Status: DC | PRN

## 2023-06-17 MED ORDER — TECHNETIUM TC 99M MEBROFENIN IV KIT: 5.0000 | PACK | Freq: Once | INTRAVENOUS | Status: DC

## 2023-06-17 MED ORDER — DIPHENHYDRAMINE HCL 12.5 MG/5ML PO ELIX: 12.5000 mg | ORAL_SOLUTION | Freq: Four times a day (QID) | ORAL | Status: DC | PRN

## 2023-06-17 MED ORDER — SODIUM CHLORIDE 0.9 % IV SOLN
2.0000 g | Freq: Once | INTRAVENOUS | Status: AC
  Administered 2023-06-17: 2 g via INTRAVENOUS
  Filled 2023-06-17: qty 20

## 2023-06-17 NOTE — ED Notes (Signed)
 Pt made aware of NPO order in place.

## 2023-06-17 NOTE — H&P (Signed)
 Derek Cardenas is an 73 y.o. male.   Chief Complaint: Abdominal pain HPI: The patient is a 73 year old white male who presents with abdominal pain that started yesterday.  The location of the pain yesterday was in his left lower quadrant.  He had 1 episode of nausea associated with it but no vomiting.  The pain has since resolved.  He denies any fevers or chills.  He does report incontinence related to his previous surgeries.  He does not smoke.  He came to the emergency department where a CT scan did show some mild thickening of the gallbladder wall but no tenderness over the gallbladder.  He also reports having occasional episodes of diverticulitis that also causing left lower quadrant pain that resolves fairly quickly with antibiotics.  Past Medical History:  Diagnosis Date   Chronic kidney disease    Diverticulosis    GERD (gastroesophageal reflux disease)    Hypertension    right renal ca dx'd 07/2020    Past Surgical History:  Procedure Laterality Date   BOWEL RESECTION  1999   CYSTOSCOPY N/A 10/18/2022   Procedure: CYSTOSCOPY;  Surgeon: Heloise Purpura, MD;  Location: [facility];  Service: Urology;  Laterality: N/A;   INGUINAL HERNIA REPAIR Right 05/11/2022   Procedure: OPEN RIGHT INGUINAL HERNIA REPAIR WITH MESH;  Surgeon: Kinsinger, De Blanch, MD;  Location: [facility] [place];  Service: General;  Laterality: Right;   LAPAROSCOPIC APPENDECTOMY N/A 08/06/2020   Procedure: APPENDECTOMY LAPAROSCOPIC;  Surgeon: Almond Lint, MD;  Location: [facility];  Service: General;  Laterality: N/A;   LYMPHADENECTOMY Bilateral 06/01/2021   Procedure: Hart Carwin, PELVIC;  Surgeon: Heloise Purpura, MD;  Location: [facility];  Service: Urology;  Laterality: Bilateral;   OPERATIVE ULTRASOUND N/A 09/29/2020   Procedure: INTRA OPERATIVE ULTRASOUND;  Surgeon: Heloise Purpura, MD;  Location: [facility];  Service: Urology;  Laterality: N/A;   ROBOT ASSISTED LAPAROSCOPIC RADICAL PROSTATECTOMY N/A 06/01/2021    Procedure: XI ROBOTIC ASSISTED LAPAROSCOPIC RADICAL PROSTATECTOMY LEVEL 2;  Surgeon: Heloise Purpura, MD;  Location: [facility];  Service: Urology;  Laterality: N/A;   ROBOTIC ASSITED PARTIAL NEPHRECTOMY Right 09/29/2020   Procedure: XI ROBOTIC ASSITED LAPAROSCOPICPARTIAL NEPHRECTOMY;  Surgeon: Heloise Purpura, MD;  Location: [facility];  Service: Urology;  Laterality: Right;   TONSILLECTOMY     TRANSURETHRAL RESECTION OF BLADDER TUMOR N/A 10/18/2022   Procedure: TRANSURETHRAL RESECTION OF BLADDER TUMOR (TURBT);  Surgeon: Heloise Purpura, MD;  Location: [facility];  Service: Urology;  Laterality: N/A;  45 MINUTES NEEDED FOR CASE    Family History  Problem Relation Age of Onset   Melanoma Mother    Social History:  reports that he has quit smoking. His smoking use included cigarettes. He quit smokeless tobacco use about 2 years ago.  His smokeless tobacco use included chew. He reports that he does not drink alcohol and does not use drugs.  Allergies: No Known Allergies  (Not in a hospital admission)   Results for orders placed or performed during the hospital encounter of 06/17/23 (from the past 48 hours)  Urinalysis, Routine w reflex microscopic -Urine, Clean Catch     Status: Abnormal   Collection Time: 06/16/23  3:49 AM  Result Value Ref Range   Color, Urine COLORLESS (A) YELLOW   APPearance CLEAR CLEAR   Specific Gravity, Urine >1.046 (H) 1.005 - 1.030   pH 6.5 5.0 - 8.0   Glucose, UA NEGATIVE NEGATIVE mg/dL   Hgb urine dipstick NEGATIVE NEGATIVE   Bilirubin Urine NEGATIVE NEGATIVE  Ketones, ur NEGATIVE NEGATIVE mg/dL   Protein, ur TRACE (A) NEGATIVE mg/dL   Nitrite NEGATIVE NEGATIVE   Leukocytes,Ua NEGATIVE NEGATIVE    Comment: Performed at [facility], [address], [place], [place] [address]  CBC     Status: Abnormal   Collection Time: 06/16/23  6:32 PM  Result Value Ref Range   WBC 16.4 (H) 4.0 - 10.5 K/uL   RBC 5.77 4.22 - 5.81 MIL/uL   Hemoglobin 15.6 13.0 -  17.0 g/dL   HCT 81.1 91.4 - 78.2 %   MCV 78.5 (L) 80.0 - 100.0 fL   MCH 27.0 26.0 - 34.0 pg   MCHC 34.4 30.0 - 36.0 g/dL   RDW 95.6 21.3 - 08.6 %   Platelets 206 150 - 400 K/uL   nRBC 0.0 0.0 - 0.2 %    Comment: Performed at [facility], [address] [place], [place] [address]  Troponin I (High Sensitivity)     Status: None   Collection Time: 06/16/23  6:32 PM  Result Value Ref Range   Troponin I (High Sensitivity) 2 <18 ng/L    Comment: (NOTE) Elevated high sensitivity troponin I (hsTnI) values and significant  changes across serial measurements may suggest ACS but many other  chronic and acute conditions are known to elevate hsTnI results.  Refer to the "Links" section for chest pain algorithms and additional  guidance. Performed at [facility], [address], [place], [place] [address]   Lipase, blood     Status: None   Collection Time: 06/16/23  6:32 PM  Result Value Ref Range   Lipase 32 11 - 51 U/L    Comment: Performed at [facility], [address] [place], [place] [address]  Comprehensive metabolic panel     Status: Abnormal   Collection Time: 06/16/23  6:32 PM  Result Value Ref Range   Sodium 137 135 - 145 mmol/L   Potassium 3.8 3.5 - 5.1 mmol/L   Chloride 105 98 - 111 mmol/L   CO2 21 (L) 22 - 32 mmol/L   Glucose, Bld 155 (H) 70 - 99 mg/dL    Comment: Glucose reference range applies only to samples taken after fasting for at least 8 hours.   BUN 20 8 - 23 mg/dL   Creatinine, Ser 2.44 0.61 - 1.24 mg/dL   Calcium 9.2 8.9 - 01.0 mg/dL   Total Protein 7.4 6.5 - 8.1 g/dL   Albumin 4.9 3.5 - 5.0 g/dL   AST 19 15 - 41 U/L   ALT 20 0 - 44 U/L   Alkaline Phosphatase 106 38 - 126 U/L   Total Bilirubin 0.9 0.0 - 1.2 mg/dL   GFR, Estimated >27 >25 mL/min    Comment: (NOTE) Calculated using the CKD-EPI Creatinine Equation (2021)    Anion gap 11 5 - 15    Comment: Performed at [facility], [address] [place], [place] [address]  Troponin I (High Sensitivity)     Status: None   Collection Time: 06/16/23 11:50 PM  Result Value Ref Range   Troponin I (High Sensitivity) 2 <18 ng/L    Comment: (NOTE) Elevated high sensitivity troponin I (hsTnI) values and significant  changes across serial measurements may suggest ACS but many other  chronic and acute conditions are known to elevate hsTnI results.  Refer to the "Links" section for chest pain algorithms and additional  guidance. Performed at [facility], [address], [place], [place]  [address]    US Abdomen Limited RUQ (LIVER/GB) Result Date: 06/17/2023 CLINICAL DATA:  Epigastric pain EXAM: ULTRASOUND ABDOMEN LIMITED RIGHT UPPER QUADRANT COMPARISON:  CT abdomen pelvis 06/17/2023 FINDINGS: Gallbladder: Cholelithiasis. Mild gallbladder wall thickening measuring up to 4 mm. Trace pericholecystic fluid. Negative sonographic Murphy's sign. Common bile duct: Diameter: 4.9 mm Liver: Increased echogenicity. No focal lesion. Portal vein is patent on color Doppler imaging with normal direction of blood flow towards the liver. Other: None. IMPRESSION: 1. Cholelithiasis with mild gallbladder wall thickening and trace pericholecystic fluid. Negative sonographic Murphy's sign. Findings are equivocal for acute cholecystitis. If there is persistent clinical concern, recommend further evaluation with HIDA scan. 2. Increased hepatic parenchymal echogenicity suggestive of steatosis. Electronically Signed   By: Annia Belt M.D.   On: 06/17/2023 09:22   CT ABDOMEN PELVIS W CONTRAST Result Date: 06/17/2023 CLINICAL DATA:  Epigastric pain. Belching. Multiple abdominal surgeries. History of kidney cancer. And prostate cancer EXAM: CT ABDOMEN AND PELVIS WITH CONTRAST TECHNIQUE: Multidetector CT imaging of the abdomen and pelvis was performed using the standard protocol following bolus administration of intravenous  contrast. RADIATION DOSE REDUCTION: This exam was performed according to the departmental dose-optimization program which includes automated exposure control, adjustment of the mA and/or kV according to patient size and/or use of iterative reconstruction technique. CONTRAST:  85mL OMNIPAQUE IOHEXOL 300 MG/ML  SOLN COMPARISON:  CT 12/03/2022 FINDINGS: Lower chest: No acute abnormality. Hepatobiliary: Hepatic steatosis. Cholelithiasis. Mild gallbladder wall thickening. No biliary dilation. Pancreas: Unremarkable. Spleen: Unremarkable. Adrenals/Urinary Tract: Normal adrenal glands. Cortical renal scarring right kidney. No urinary calculi or hydronephrosis. Thick-walled nondistended bladder. Presumed mural calcifications in the posterior bladder wall. Stomach/Bowel: Postoperative change about the rectosigmoid junction. Colonic diverticulosis without diverticulitis. Nonobstructed ileum herniates into the right inguinal hernia. Appendectomy. Stomach is within normal limits. Vascular/Lymphatic: No significant vascular findings are present. No enlarged abdominal or pelvic lymph nodes. Reproductive: Prostatectomy. Other: Fat containing ventral abdominal wall hernias. Musculoskeletal: No acute fracture. IMPRESSION: 1. Cholelithiasis with mild gallbladder wall thickening. Findings may be due to cholecystitis or liver pathology. Consider right upper quadrant ultrasound for further evaluation. 2. Hepatic steatosis. 3. Nonobstructed ileum herniates into the right inguinal hernia. 4. Fat containing ventral abdominal wall hernias. 5. Thick-walled nondistended bladder. Correlate with urinalysis for cystitis. Electronically Signed   By: Minerva Fester M.D.   On: 06/17/2023 03:53   DG Chest 2 View Result Date: 06/16/2023 CLINICAL DATA:  Epigastric chest pain since this morning. History of kidney cancer and prostate cancer. EXAM: CHEST - 2 VIEW COMPARISON:  CT chest 12/03/2022 FINDINGS: Heart size and pulmonary vascularity are  normal. Linear atelectasis or fibrosis in the lung bases. No airspace disease or consolidation. No pleural effusion or pneumothorax. Mediastinal contours appear intact. IMPRESSION: Linear atelectasis or fibrosis in the lung bases. Electronically Signed   By: Burman Nieves M.D.   On: 06/16/2023 20:56    Review of Systems  Constitutional: Negative.   HENT: Negative.    Eyes: Negative.   Respiratory: Negative.    Cardiovascular: Negative.   Gastrointestinal:  Positive for abdominal pain.  Endocrine: Negative.   Genitourinary:  Positive for difficulty urinating.  Musculoskeletal: Negative.   Skin: Negative.   Allergic/Immunologic: Negative.   Neurological: Negative.   Hematological: Negative.   Psychiatric/Behavioral: Negative.      Blood pressure 123/88, pulse 90, temperature 97.9 F (36.6 C), temperature source Oral, resp. rate 17, SpO2 100%. Physical Exam Vitals reviewed.  Constitutional:      General: He is not  in acute distress.    Appearance: Normal appearance.  HENT:     Head: Normocephalic and atraumatic.     Right Ear: External ear normal.     Left Ear: External ear normal.     Nose: Nose normal.     Mouth/Throat:     Mouth: Mucous membranes are moist.     Pharynx: Oropharynx is clear.  Eyes:     Extraocular Movements: Extraocular movements intact.     Conjunctiva/sclera: Conjunctivae normal.     Pupils: Pupils are equal, round, and reactive to light.  Cardiovascular:     Rate and Rhythm: Normal rate and regular rhythm.     Pulses: Normal pulses.     Heart sounds: Normal heart sounds.  Pulmonary:     Effort: Pulmonary effort is normal. No respiratory distress.     Breath sounds: Normal breath sounds.  Abdominal:     General: Abdomen is flat.     Palpations: Abdomen is soft.     Tenderness: There is no abdominal tenderness.     Comments: There is a moderate-sized fat-containing upper ventral hernia that does not reduce.  There is a well-healed midline scar.   Musculoskeletal:        General: No swelling or deformity. Normal range of motion.     Cervical back: Normal range of motion and neck supple.  Skin:    General: Skin is warm and dry.     Coloration: Skin is not jaundiced.  Neurological:     General: No focal deficit present.     Mental Status: He is alert and oriented to person, place, and time.  Psychiatric:        Mood and Affect: Mood normal.        Behavior: Behavior normal.      Assessment/Plan The patient presents with CT findings of some mild thickening of the gallbladder wall consistent with cholecystitis but no pain over the gallbladder.  The pain that he did have was in the left lower quadrant and has since resolved.  At this point we will admit him to the hospital for antibiotics and IV hydration given his elevated white count.  We will obtain a HIDA scan to rule out or rule in cholecystitis so that we can make an appropriate recommendation on whether he should have surgery for removing his gallbladder.  Chevis Pretty III, MD 06/17/2023, 2:05 PM

## 2023-06-17 NOTE — ED Provider Notes (Signed)
 Care was taken over from Dr. Judd Lien.  Patient presented with intermittent upper abdominal pain.  White count is elevated 16,000.  He is afebrile.  Imaging shows cholelithiasis with equivocal cholecystitis.  He was given Rocephin for antibiotic coverage.  I discussed with the surgery team, Derek Best, PA, he request transfer to the [facility] emergency room so they can evaluate the patient and possibly arrange surgery for today.  Dr. Adela Lank has accepted the patient to the [facility] long ED.   Derek Bucco, MD 06/17/23 [phone]

## 2023-06-17 NOTE — ED Notes (Signed)
 Called [facility] to transport patient to [facility] Adela Lank accepting

## 2023-06-17 NOTE — ED Provider Notes (Signed)
 Patient was as a transfer from [facility] emergency department brought to [facility] for general surgery evaluation for cholecystitis/cholelithiasis.  At this time, patient is awaiting general surgery evaluation. Physical Exam  BP 128/74   Pulse 69   Temp 97.9 F (36.6 C) (Oral)   Resp 18   SpO2 94%   Physical Exam Vitals and nursing note reviewed.  Constitutional:      General: He is not in acute distress.    Appearance: He is well-developed.  HENT:     Head: Normocephalic and atraumatic.  Eyes:     Conjunctiva/sclera: Conjunctivae normal.  Cardiovascular:     Rate and Rhythm: Normal rate and regular rhythm.     Heart sounds: No murmur heard. Pulmonary:     Effort: Pulmonary effort is normal. No respiratory distress.     Breath sounds: Normal breath sounds.  Abdominal:     Palpations: Abdomen is soft.     Tenderness: There is no abdominal tenderness.     Comments: No focal abdominal tenderness at this time.  Musculoskeletal:        General: No swelling.     Cervical back: Neck supple.  Skin:    General: Skin is warm and dry.     Capillary Refill: Capillary refill takes less than 2 seconds.  Neurological:     Mental Status: He is alert.  Psychiatric:        Mood and Affect: Mood normal.     Procedures  Procedures  ED Course / MDM    Medical Decision Making Amount and/or Complexity of Data Reviewed Labs: ordered. Radiology: ordered.  Risk Prescription drug management. Decision regarding hospitalization.   Patient was as a transfer from [facility] emergency department brought to [facility] for general surgery evaluation for cholecystitis/cholelithiasis.  At this time, patient is awaiting general surgery evaluation.  I reassessed the patient at bedside who at this time is asymptomatic with no reported pain.  Also denies any nausea.  Patient's vitals are unremarkable with a normal heart rate, afebrile temperature, normotensive, and he is not in respiratory  distress.  Patient evaluated at bedside by Dr. Carolynne Edouard with general surgery.  Will plan on a HIDA scan to rule out/rule in cholecystitis and make recommendations based on results.        Smitty Knudsen, PA-C 06/17/23 1512    Melene Plan, DO 06/17/23 1600

## 2023-06-17 NOTE — ED Provider Notes (Signed)
 [place] EMERGENCY DEPARTMENT AT [facility] Provider Note   CSN: [id number] Arrival date & time: 06/16/23  1805     History  No chief complaint on file.   Derek Cardenas is a 73 y.o. male.  Patient is a 73 year old male with past medical history of multiple prior abdominal surgeries including appendectomy, hernia repair, nephrectomy, and surgery for diverticulitis.  Patient presenting today with complaints of abdominal discomfort.  Since earlier this morning, he has been experiencing pain across his upper abdomen in the left upper quadrant right upper quadrant and epigastric area.  The pain seems to come and go in waves and he describes it as "severe".  He has felt nauseated at one point, but did not vomit.  No fevers or chills.  No diarrhea or constipation.  He does report having 2 small bowel movements today, but not passing much flatus.  No prior history of bowel obstruction.  Pain is worse with palpation and movement with no alleviating factors.  The history is provided by the patient.       Home Medications Prior to Admission medications   Medication Sig Start Date End Date Taking? Authorizing Provider  amLODipine (NORVASC) 10 MG tablet Take 10 mg by mouth daily.    [provider]  famotidine (PEPCID) 20 MG tablet Take 20 mg by mouth daily.    [provider]  loperamide (IMODIUM) 2 MG capsule Take 2 mg by mouth as needed for diarrhea or loose stools.    [provider]  loratadine (CLARITIN) 10 MG tablet Take 10 mg by mouth daily.    [provider]  nystatin cream (MYCOSTATIN) Apply to affected area 2 times daily Patient not taking: Reported on 10/08/2022 05/18/22   Glyn Ade, MD  omeprazole (PRILOSEC) 40 MG capsule Take 40 mg by mouth daily.    [provider]  phenazopyridine (PYRIDIUM) 200 MG tablet Take 1 tablet (200 mg total) by mouth 3 (three) times daily as needed for pain. 10/18/22   Heloise Purpura, MD   sildenafil (VIAGRA) 100 MG tablet SMARTSIG:1 Tablet(s) By Mouth As Directed PRN 09/09/21   [provider]      Allergies    Patient has no known allergies.    Review of Systems   Review of Systems  All other systems reviewed and are negative.   Physical Exam Updated Vital Signs BP (!) 143/83 (BP Location: Right Arm)   Pulse 94   Temp 98.2 F (36.8 C) (Oral)   Resp 16   SpO2 96%  Physical Exam Vitals and nursing note reviewed.  Constitutional:      General: He is not in acute distress.    Appearance: He is well-developed. He is not diaphoretic.  HENT:     Head: Normocephalic and atraumatic.  Cardiovascular:     Rate and Rhythm: Normal rate and regular rhythm.     Heart sounds: No murmur heard.    No friction rub.  Pulmonary:     Effort: Pulmonary effort is normal. No respiratory distress.     Breath sounds: Normal breath sounds. No wheezing or rales.  Abdominal:     General: Bowel sounds are normal. There is no distension.     Palpations: Abdomen is soft.     Tenderness: There is abdominal tenderness. There is no guarding or rebound.     Comments: There is tenderness in the right upper quadrant, left upper quadrant, and epigastric region.  Musculoskeletal:  General: Normal range of motion.     Cervical back: Normal range of motion and neck supple.  Skin:    General: Skin is warm and dry.  Neurological:     Mental Status: He is alert and oriented to person, place, and time.     Coordination: Coordination normal.     ED Results / Procedures / Treatments   Labs (all labs ordered are listed, but only abnormal results are displayed) Labs Reviewed  CBC - Abnormal; Notable for the following components:      Result Value   WBC 16.4 (*)    MCV 78.5 (*)    All other components within normal limits  COMPREHENSIVE METABOLIC PANEL - Abnormal; Notable for the following components:   CO2 21 (*)    Glucose, Bld 155 (*)    All other components within normal  limits  LIPASE, BLOOD  URINALYSIS, ROUTINE W REFLEX MICROSCOPIC  TROPONIN I (HIGH SENSITIVITY)  TROPONIN I (HIGH SENSITIVITY)    EKG EKG Interpretation Date/Time:  Thursday June 16 2023 18:20:34 EST Ventricular Rate:  107 PR Interval:  130 QRS Duration:  122 QT Interval:  372 QTC Calculation: 496 R Axis:   44  Text Interpretation: Sinus tachycardia Right bundle branch block Abnormal ECG No significant change since 10/12/2022 Confirmed by Geoffery Lyons ([id number]) on 06/17/2023 1:08:50 AM  Radiology DG Chest 2 View Result Date: 06/16/2023 CLINICAL DATA:  Epigastric chest pain since this morning. History of kidney cancer and prostate cancer. EXAM: CHEST - 2 VIEW COMPARISON:  CT chest 12/03/2022 FINDINGS: Heart size and pulmonary vascularity are normal. Linear atelectasis or fibrosis in the lung bases. No airspace disease or consolidation. No pleural effusion or pneumothorax. Mediastinal contours appear intact. IMPRESSION: Linear atelectasis or fibrosis in the lung bases. Electronically Signed   By: Burman Nieves M.D.   On: 06/16/2023 20:56    Procedures Procedures    Medications Ordered in ED Medications  sodium chloride 0.9 % bolus 1,000 mL (has no administration in time range)    ED Course/ Medical Decision Making/ A&P  Patient is a 73 year old male presenting with upper abdominal pain as described in the HPI.  Patient arrives with stable vital signs and is afebrile.  On exam, he does have tenderness across the upper abdomen in the epigastric region, left upper quadrant, and right upper quadrant.  No peritoneal signs.  Laboratory studies obtained including CBC, CMP, and lipase.  He has a white count of 16,000, but LFTs and lipase are unremarkable.  CT scan of the abdomen and pelvis obtained showing thickening of the gallbladder and possible cholecystitis.  An ultrasound was recommended for further evaluation.  Patient has received IV fluids along with morphine for pain and  Zofran for nausea and seems to be feeling somewhat better.  Care signed out to oncoming provider at shift change pending ultrasound.  Final Clinical Impression(s) / ED Diagnoses Final diagnoses:  None    Rx / DC Orders ED Discharge Orders     None         Geoffery Lyons, MD 06/17/23 2304

## 2023-06-18 DIAGNOSIS — Z79899 Other long term (current) drug therapy: Secondary | ICD-10-CM | POA: Diagnosis not present

## 2023-06-18 DIAGNOSIS — K819 Cholecystitis, unspecified: Secondary | ICD-10-CM | POA: Diagnosis present

## 2023-06-18 DIAGNOSIS — Z87891 Personal history of nicotine dependence: Secondary | ICD-10-CM | POA: Diagnosis not present

## 2023-06-18 DIAGNOSIS — I129 Hypertensive chronic kidney disease with stage 1 through stage 4 chronic kidney disease, or unspecified chronic kidney disease: Secondary | ICD-10-CM | POA: Diagnosis present

## 2023-06-18 DIAGNOSIS — K5732 Diverticulitis of large intestine without perforation or abscess without bleeding: Secondary | ICD-10-CM | POA: Diagnosis present

## 2023-06-18 DIAGNOSIS — Z808 Family history of malignant neoplasm of other organs or systems: Secondary | ICD-10-CM | POA: Diagnosis not present

## 2023-06-18 DIAGNOSIS — Z905 Acquired absence of kidney: Secondary | ICD-10-CM | POA: Diagnosis not present

## 2023-06-18 DIAGNOSIS — K8010 Calculus of gallbladder with chronic cholecystitis without obstruction: Secondary | ICD-10-CM | POA: Diagnosis present

## 2023-06-18 DIAGNOSIS — K219 Gastro-esophageal reflux disease without esophagitis: Secondary | ICD-10-CM | POA: Diagnosis present

## 2023-06-18 DIAGNOSIS — Z9049 Acquired absence of other specified parts of digestive tract: Secondary | ICD-10-CM | POA: Diagnosis not present

## 2023-06-18 DIAGNOSIS — Z85528 Personal history of other malignant neoplasm of kidney: Secondary | ICD-10-CM | POA: Diagnosis not present

## 2023-06-18 DIAGNOSIS — K8050 Calculus of bile duct without cholangitis or cholecystitis without obstruction: Secondary | ICD-10-CM | POA: Diagnosis present

## 2023-06-18 LAB — CBC
HCT: 41.2 % (ref 39.0–52.0)
Hemoglobin: 13.6 g/dL (ref 13.0–17.0)
MCH: 27 pg (ref 26.0–34.0)
MCHC: 33 g/dL (ref 30.0–36.0)
MCV: 81.7 fL (ref 80.0–100.0)
Platelets: 137 10*3/uL — ABNORMAL LOW (ref 150–400)
RBC: 5.04 MIL/uL (ref 4.22–5.81)
RDW: 14 % (ref 11.5–15.5)
WBC: 6.3 10*3/uL (ref 4.0–10.5)
nRBC: 0 % (ref 0.0–0.2)

## 2023-06-18 LAB — COMPREHENSIVE METABOLIC PANEL
ALT: 23 U/L (ref 0–44)
AST: 20 U/L (ref 15–41)
Albumin: 3.4 g/dL — ABNORMAL LOW (ref 3.5–5.0)
Alkaline Phosphatase: 83 U/L (ref 38–126)
Anion gap: 8 (ref 5–15)
BUN: 15 mg/dL (ref 8–23)
CO2: 24 mmol/L (ref 22–32)
Calcium: 8.4 mg/dL — ABNORMAL LOW (ref 8.9–10.3)
Chloride: 105 mmol/L (ref 98–111)
Creatinine, Ser: 1.04 mg/dL (ref 0.61–1.24)
GFR, Estimated: >60 mL/min (ref >60)
Glucose, Bld: 79 mg/dL (ref 70–99)
Potassium: 4 mmol/L (ref 3.5–5.1)
Sodium: 137 mmol/L (ref 135–145)
Total Bilirubin: 1 mg/dL (ref 0.0–1.2)
Total Protein: 6.2 g/dL — ABNORMAL LOW (ref 6.5–8.1)

## 2023-06-18 NOTE — Progress Notes (Signed)
 Mobility Specialist - Progress Note   06/18/23 0919  Mobility  Activity Ambulated independently in hallway  Level of Assistance Independent  Assistive Device None  Distance Ambulated (ft) 500 ft  Activity Response Tolerated well  Mobility Referral Yes  Mobility visit 1 Mobility  Mobility Specialist Start Time (ACUTE ONLY) 0854  Mobility Specialist Stop Time (ACUTE ONLY) 0910  Mobility Specialist Time Calculation (min) (ACUTE ONLY) 16 min   Pt received in recliner and agreeable to mobility. No complaints during session. Pt to recliner after session with all needs met.    [facility]

## 2023-06-18 NOTE — Progress Notes (Signed)
 Diverticulitis of colon  Subjective: Still having LLQ pain  Objective: Vital signs in last 24 hours: Temp:  [97.9 F (36.6 C)-98.6 F (37 C)] 98.5 F (36.9 C) (03/01 0541) Pulse Rate:  [60-90] 65 (03/01 0541) Resp:  [16-18] 17 (03/01 0541) BP: (108-130)/(58-88) 112/75 (03/01 0541) SpO2:  [93 %-100 %] 95 % (03/01 0541) Weight:  [77.3 kg] 77.3 kg (02/28 1925) Last BM Date : 06/16/23  Intake/Output from previous day: 02/28 0701 - 03/01 0700 In: 301.2 [I.V.:100; IV Piggyback:201.2] Out: 300 [Urine:300] Intake/Output this shift: No intake/output data recorded.  General appearance: alert and cooperative GI: TTP LLQ  Lab Results:  Results for orders placed or performed during the hospital encounter of 06/17/23 (from the past 24 hours)  Comprehensive metabolic panel     Status: Abnormal   Collection Time: 06/18/23  5:46 AM  Result Value Ref Range   Sodium 137 135 - 145 mmol/L   Potassium 4.0 3.5 - 5.1 mmol/L   Chloride 105 98 - 111 mmol/L   CO2 24 22 - 32 mmol/L   Glucose, Bld 79 70 - 99 mg/dL   BUN 15 8 - 23 mg/dL   Creatinine, Ser 9.51 0.61 - 1.24 mg/dL   Calcium 8.4 (L) 8.9 - 10.3 mg/dL   Total Protein 6.2 (L) 6.5 - 8.1 g/dL   Albumin 3.4 (L) 3.5 - 5.0 g/dL   AST 20 15 - 41 U/L   ALT 23 0 - 44 U/L   Alkaline Phosphatase 83 38 - 126 U/L   Total Bilirubin 1.0 0.0 - 1.2 mg/dL   GFR, Estimated >88 >41 mL/min   Anion gap 8 5 - 15  CBC     Status: Abnormal   Collection Time: 06/18/23  5:46 AM  Result Value Ref Range   WBC 6.3 4.0 - 10.5 K/uL   RBC 5.04 4.22 - 5.81 MIL/uL   Hemoglobin 13.6 13.0 - 17.0 g/dL   HCT 66.0 63.0 - 16.0 %   MCV 81.7 80.0 - 100.0 fL   MCH 27.0 26.0 - 34.0 pg   MCHC 33.0 30.0 - 36.0 g/dL   RDW 10.9 32.3 - 55.7 %   Platelets 137 (L) 150 - 400 K/uL   nRBC 0.0 0.0 - 0.2 %     Studies/Results Radiology     MEDS, Scheduled  amLODipine  5 mg Oral Daily   enoxaparin (LOVENOX) injection  40 mg Subcutaneous Q24H   famotidine  20 mg Oral  Daily   loratadine  10 mg Oral Daily   technetium TC 5M mebrofenin  5 millicurie Intravenous Once     Assessment: Diverticulitis of colon: pt's symptoms seem more concerning for diverticulitis HIDA neg for cholecystitis   Plan: WBC down to normal Will advance diet as tolerated   LOS: 0 days    Vanita Panda, MD [facility]   Patient's medical decision making was moderate    06/18/2023 9:56 AM

## 2023-06-18 NOTE — Plan of Care (Signed)
   Problem: Coping: Goal: Level of anxiety will decrease Outcome: Progressing

## 2023-06-18 NOTE — Plan of Care (Signed)
  Problem: Activity: Goal: Risk for activity intolerance will decrease Outcome: Completed/Met

## 2023-06-19 MED ORDER — AMOXICILLIN-POT CLAVULANATE 875-125 MG PO TABS
1.0000 | ORAL_TABLET | Freq: Two times a day (BID) | ORAL | Status: DC
  Administered 2023-06-19 – 2023-06-20 (×3): 1 via ORAL
  Filled 2023-06-19 (×3): qty 1

## 2023-06-19 NOTE — Plan of Care (Signed)
 ?  Problem: Clinical Measurements: ?Goal: Will remain free from infection ?Outcome: Progressing ?  ?

## 2023-06-19 NOTE — Progress Notes (Signed)
 Diverticulitis of colon  Subjective:  LLQ pain better, tolerating a diet and having diarrhea  Objective: Vital signs in last 24 hours: Temp:  [97.6 F (36.4 C)-98.8 F (37.1 C)] 98.7 F (37.1 C) (03/02 0610) Pulse Rate:  [63-71] 66 (03/02 0610) Resp:  [18] 18 (03/02 0610) BP: (116-133)/(72-82) 116/72 (03/02 0610) SpO2:  [94 %-96 %] 94 % (03/02 0610) Last BM Date : 06/18/23  Intake/Output from previous day: 03/01 0701 - 03/02 0700 In: 2190.1 [P.O.:2040; IV Piggyback:150.1] Out: 850 [Urine:850] Intake/Output this shift: No intake/output data recorded.  General appearance: alert and cooperative GI: no LLQ TTP  Lab Results:  No results found for this or any previous visit (from the past 24 hours).    Studies/Results Radiology     MEDS, Scheduled  amLODipine  5 mg Oral Daily   amoxicillin-clavulanate  1 tablet Oral Q12H   enoxaparin (LOVENOX) injection  40 mg Subcutaneous Q24H   famotidine  20 mg Oral Daily   loratadine  10 mg Oral Daily   technetium TC 25M mebrofenin  5 millicurie Intravenous Once     Assessment: Diverticulitis of colon: pt's symptoms seem more concerning for diverticulitis HIDA neg for cholecystitis   Plan: WBC down to normal Cont reg diet PO abx Recheck cbc in AM   LOS: 1 day    Vanita Panda, MD [facility]   Patient's medical decision making was moderate    06/19/2023 8:34 AM

## 2023-06-20 LAB — CBC
HCT: 43.1 % (ref 39.0–52.0)
Hemoglobin: 14.3 g/dL (ref 13.0–17.0)
MCH: 27.2 pg (ref 26.0–34.0)
MCHC: 33.2 g/dL (ref 30.0–36.0)
MCV: 81.9 fL (ref 80.0–100.0)
Platelets: 164 10*3/uL (ref 150–400)
RBC: 5.26 MIL/uL (ref 4.22–5.81)
RDW: 14 % (ref 11.5–15.5)
WBC: 6.7 10*3/uL (ref 4.0–10.5)
nRBC: 0 % (ref 0.0–0.2)

## 2023-06-20 LAB — BASIC METABOLIC PANEL
Anion gap: 8 (ref 5–15)
BUN: 14 mg/dL (ref 8–23)
CO2: 23 mmol/L (ref 22–32)
Calcium: 8.3 mg/dL — ABNORMAL LOW (ref 8.9–10.3)
Chloride: 106 mmol/L (ref 98–111)
Creatinine, Ser: 1.07 mg/dL (ref 0.61–1.24)
GFR, Estimated: >60 mL/min (ref >60)
Glucose, Bld: 110 mg/dL — ABNORMAL HIGH (ref 70–99)
Potassium: 3.6 mmol/L (ref 3.5–5.1)
Sodium: 137 mmol/L (ref 135–145)

## 2023-06-20 MED ORDER — AMOXICILLIN-POT CLAVULANATE 875-125 MG PO TABS: 1.0000 | ORAL_TABLET | Freq: Two times a day (BID) | ORAL | 0 refills | Status: AC

## 2023-06-20 NOTE — Plan of Care (Signed)
 ?  Problem: Clinical Measurements: ?Goal: Will remain free from infection ?Outcome: Progressing ?  ?

## 2023-06-20 NOTE — Discharge Summary (Signed)
 [facility] Discharge Summary   Patient ID: Derek Cardenas MRN: [id number] DOB/AGE: [birth year] 72 y.o.  Admit date: 06/17/2023 Discharge date: 06/20/2023  Admitting Diagnosis: Abdominal pain   Discharge Diagnosis Possible Biliary colic Possible diverticulitis   Consultants None   Imaging: CT 2/28 with  IMPRESSION: 1. Cholelithiasis with mild gallbladder wall thickening. Findings may be due to cholecystitis or liver pathology. Consider right upper quadrant ultrasound for further evaluation. 2. Hepatic steatosis. 3. Nonobstructed ileum herniates into the right inguinal hernia. 4. Fat containing ventral abdominal wall hernias. 5. Thick-walled nondistended bladder. Correlate with urinalysis for cystitis.  [place] RUQ 2/28 with IMPRESSION: 1. Cholelithiasis with mild gallbladder wall thickening and trace pericholecystic fluid. Negative sonographic Murphy's sign. Findings are equivocal for acute cholecystitis. If there is persistent clinical concern, recommend further evaluation with HIDA scan. 2. Increased hepatic parenchymal echogenicity suggestive of steatosis.  HIDA 2/28 with FINDINGS: Prompt uptake and biliary excretion of activity by the liver is seen. Gallbladder activity is visualized, consistent with patency of cystic duct. Biliary activity passes into small bowel, consistent with patent common bile duct.   IMPRESSION: No scintigraphic evidence of acute cholecystitis.    Procedures None   Hospital Course:  Patient is a 73 year old male who presented to the ED with abdominal pain.  Workup showed initial concern for possible cholecystitis and patient was admitted. Underwent HIDA scan which was negative for acute cholecystitis. Symptoms felt more concerning for possible diverticulitis. He was treated with antibiotics. Diet was advanced as tolerated.  On 06/20/23, the patient was voiding well, tolerating diet, ambulating well, pain well controlled, vital signs  stable and felt stable for discharge home.  Patient will follow up as needed if signs of biliary colic. May follow up with GI as needed for other abdominal pain. Return to ED precautions given as well and he and his wife verbalized understanding.   Physical Exam: General:  Alert, NAD, pleasant, comfortable Cardiac: RRR Resp: normal effort of breathing Abd:  Soft, ND, no TTP, +BS  I or a member of my team have reviewed this patient in the Controlled Substance Database.   Allergies as of 06/20/2023   No Known Allergies      Medication List     TAKE these medications    amLODipine 10 MG tablet Commonly known as: NORVASC Take 10 mg by mouth daily.   amoxicillin-clavulanate 875-125 MG tablet Commonly known as: AUGMENTIN Take 1 tablet by mouth every 12 (twelve) hours for 10 days.   famotidine 20 MG tablet Commonly known as: PEPCID Take 20 mg by mouth daily.   loperamide 2 MG capsule Commonly known as: IMODIUM Take 2 mg by mouth as needed for diarrhea or loose stools.   loratadine 10 MG tablet Commonly known as: CLARITIN Take 10 mg by mouth daily.   omeprazole 40 MG capsule Commonly known as: PRILOSEC Take 40 mg by mouth daily.   sildenafil 100 MG tablet Commonly known as: VIAGRA Take 100 mg by mouth daily as needed for erectile dysfunction.          Follow-up Information     [facility], [facility]. Call.   Specialty: General Surgery Why: As needed if having symptoms of biliary colic - you could see any of our surgeons but if you would like to see Dr. Sheliah Hatch or Dr. Donell Beers since they have previously operated on you that would be fine. Contact information: [address] [place] [address] [phone]  Signed: Juliet Rude , [facility] 06/20/2023, 3:00 PM Please see Amion for pager number during day hours 7:00am-4:30pm

## 2023-06-20 NOTE — Progress Notes (Signed)
 Reviewed written d/c instructions w pt and his wife and all questions answered. They verbalized understanding. D/C via w/c w all belongings in stable condition.

## 2024-01-04 ENCOUNTER — Other Ambulatory Visit (HOSPITAL_COMMUNITY): Payer: Self-pay | Admitting: Urology

## 2024-01-04 DIAGNOSIS — C61 Malignant neoplasm of prostate: Secondary | ICD-10-CM

## 2024-01-10 ENCOUNTER — Encounter (HOSPITAL_COMMUNITY)
Admission: RE | Admit: 2024-01-10 | Discharge: 2024-01-10 | Disposition: A | Discharge status: Home / Self Care | Source: Ambulatory Visit | Attending: Urology | Admitting: Urology

## 2024-01-10 DIAGNOSIS — C61 Malignant neoplasm of prostate: Secondary | ICD-10-CM | POA: Diagnosis present

## 2024-01-10 MED ORDER — FLOTUFOLASTAT F 18 GALLIUM 296-5846 MBQ/ML IV SOLN
8.3020 | Freq: Once | INTRAVENOUS | Status: AC
  Administered 2024-01-10: 8.302 via INTRAVENOUS

## 2024-01-17 ENCOUNTER — Telehealth: Payer: Self-pay | Admitting: Radiation Oncology

## 2024-01-17 ENCOUNTER — Other Ambulatory Visit: Payer: Self-pay | Admitting: Radiation Oncology

## 2024-01-17 ENCOUNTER — Inpatient Hospital Stay
Admission: RE | Admit: 2024-01-17 | Discharge: 2024-01-17 | Disposition: A | Discharge status: Home / Self Care | Payer: Self-pay | Source: Ambulatory Visit | Attending: Radiation Oncology | Admitting: Radiation Oncology

## 2024-01-17 DIAGNOSIS — C61 Malignant neoplasm of prostate: Secondary | ICD-10-CM

## 2024-01-17 NOTE — Telephone Encounter (Signed)
 9/30 @ 11:28 am Left voicemail with [facility] for recent CT scans to be attached to PACs in epic.  Waiting on images.

## 2024-02-06 NOTE — Progress Notes (Signed)
 Histology and Location of Primary Cancer:  Prostate carcinoma with biochemical recurrence.   Sites of Visceral and Bony Metastatic Disease:  Mild activity associated with two small LEFT periaortic lymph node just above the aortic bifurcation. Findings concerning for prostate cancer nodal metastasis. As lesions are very small consider close attention on follow-up.  Location(s) of Symptomatic Metastases: periaortic lymph nodes No evidence of visceral metastasis or skeletal metastasis.   Past/Anticipated chemotherapy by medical oncology, if any: None  Pain on a scale of 0-10 is: 0   If Spine Met(s), symptoms, if any, include: Bowel/Bladder retention or incontinence (please describe): Some bladder incontinence since having  last radiation Numbness or weakness in extremities (please describe): None Current Decadron  regimen, if applicable: None  Ambulatory status? Walker? Wheelchair?: No,  Ambulatory  SAFETY ISSUES: Prior radiation? yes Pacemaker/ICD? no Possible current pregnancy? no Is the patient on methotrexate? no  Current Complaints / other details:  None

## 2024-02-09 ENCOUNTER — Ambulatory Visit: Admission: RE | Admit: 2024-02-09 | Discharge: 2024-02-09 | Attending: Urology | Admitting: Urology

## 2024-02-09 ENCOUNTER — Ambulatory Visit
Admission: RE | Admit: 2024-02-09 | Discharge: 2024-02-09 | Disposition: A | Discharge status: Home / Self Care | Source: Ambulatory Visit | Attending: Radiation Oncology | Admitting: Radiation Oncology

## 2024-02-09 ENCOUNTER — Encounter: Payer: Self-pay | Admitting: Radiation Oncology

## 2024-02-09 VITALS — BP 117/79 | HR 82 | Temp 97.7°F | Resp 18 | Ht 66.0 in | Wt 174.2 lb

## 2024-02-09 DIAGNOSIS — R9721 Rising PSA following treatment for malignant neoplasm of prostate: Secondary | ICD-10-CM

## 2024-02-09 NOTE — Progress Notes (Signed)
 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 Current PSA is 0.26.    ICD-10-CM   1. 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 male seen at the request of Dr. Renda.  He is well-known to our service, having completed salvage radiotherapy to the prostatic fossa and pelvic lymph nodes in August 2023, concurrent with ST-ADT, for a postoperative PSA of 0.32 s/p RALP in February 2023 for stage pT3b N1, Gleason 5+4 adenocarcinoma of the prostate.  His post radiation PSA was undetectable through 2024 but became low detectable again in February 2025 at 0.18 and increased up to 2.6 most recently in September 2025.  A PSMA PET scan was performed for disease restaging on 01/10/2024 and showed 2 small tracer avid periaortic nodes on the left but no evidence of local recurrence or skeletal involvement.     He has been kindly referred back to us  today to discuss the potential role for metastasis directed therapy utilizing highly focused radiotherapy in the management of oligometastatic disease.  He is accompanied by his wife, Montie, for today's visit.  PREVIOUS RADIATION THERAPY: Yes   10/14/21 - 12/10/21:  1. The prostate fossa and pelvic lymph nodes were initially treated to 45 Gy in 25 fractions of 1.8 Gy  2. The prostate fossa only was boosted to 68.4 Gy with 13 additional fractions of 1.8 Gy   PAST MEDICAL HISTORY:  Past Medical History:  Diagnosis Date   Chronic  kidney disease    Diverticulosis    GERD (gastroesophageal reflux disease)    Hypertension    right renal ca dx'd 07/2020      PAST SURGICAL HISTORY: Past Surgical History:  Procedure Laterality Date   BOWEL RESECTION  1999   CYSTOSCOPY N/A 10/18/2022   Procedure: CYSTOSCOPY;  Surgeon: Renda Glance, MD;  Location: [facility];  Service: Urology;  Laterality: N/A;   INGUINAL HERNIA REPAIR Right 05/11/2022   Procedure: OPEN RIGHT INGUINAL HERNIA REPAIR WITH MESH;  Surgeon: Kinsinger, Herlene Righter, MD;  Location: [facility];  Service: General;  Laterality: Right;   LAPAROSCOPIC APPENDECTOMY N/A 08/06/2020   Procedure: APPENDECTOMY LAPAROSCOPIC;  Surgeon: Aron Shoulders, MD;  Location: [facility];  Service: General;  Laterality: N/A;   LYMPHADENECTOMY Bilateral 06/01/2021   Procedure: REDGIE, PELVIC;  Surgeon: Renda Glance, MD;  Location: [facility];  Service: Urology;  Laterality: Bilateral;   OPERATIVE ULTRASOUND N/A 09/29/2020   Procedure: INTRA OPERATIVE ULTRASOUND;  Surgeon: Renda Glance, MD;  Location: [facility];  Service: Urology;  Laterality: N/A;   ROBOT ASSISTED LAPAROSCOPIC RADICAL PROSTATECTOMY N/A 06/01/2021   Procedure: XI ROBOTIC ASSISTED LAPAROSCOPIC RADICAL PROSTATECTOMY LEVEL 2;  Surgeon: Renda Glance, MD;  Location: [facility];  Service: Urology;  Laterality: N/A;   ROBOTIC ASSITED PARTIAL NEPHRECTOMY Right 09/29/2020   Procedure: XI ROBOTIC ASSITED LAPAROSCOPICPARTIAL NEPHRECTOMY;  Surgeon: Renda Glance, MD;  Location: [facility];  Service: Urology;  Laterality: Right;   TONSILLECTOMY     TRANSURETHRAL RESECTION  OF BLADDER TUMOR N/A 10/18/2022   Procedure: TRANSURETHRAL RESECTION OF BLADDER TUMOR (TURBT);  Surgeon: Renda Glance, MD;  Location: [facility];  Service: Urology;  Laterality: N/A;  45 MINUTES NEEDED FOR CASE    FAMILY HISTORY:  Family History  Problem Relation Age of Onset   Melanoma Mother     SOCIAL HISTORY:  Social History   Socioeconomic History    Marital status: Married    Spouse name: Not on file   Number of children: Not on file   Years of education: Not on file   Highest education level: Not on file  Occupational History   Not on file  Tobacco Use   Smoking status: Former    Types: Cigarettes   Smokeless tobacco: Former    Types: Chew    Quit date: 08/19/2020  Vaping Use   Vaping status: Never Used  Substance and Sexual Activity   Alcohol use: Never   Drug use: Never   Sexual activity: Not Currently  Other Topics Concern   Not on file  Social History Narrative   Not on file   Social Drivers of Health   Financial Resource Strain: Not on file  Food Insecurity: Low Risk  (01/24/2024)   Received from [facility]   Hunger Vital Sign    Within the past 12 months, you worried that your food would run out before you got money to buy more: Never true    Within the past 12 months, the food you bought just didn't last and you didn't have money to get more. : Never true  Transportation Needs: No Transportation Needs (01/24/2024)   Received from [facility]    In the past 12 months, has lack of reliable transportation kept you from medical appointments, meetings, work or from getting things needed for daily living? : No  Physical Activity: Not on file  Stress: Not on file  Social Connections: Moderately Integrated (06/17/2023)   Social Connection and Isolation Panel    Frequency of Communication with Friends and Family: Three times a week    Frequency of Social Gatherings with Friends and Family: Once a week    Attends Religious Services: 1 to 4 times per year    Active Member of [employer] or Organizations: No    Attends Banker Meetings: Never    Marital Status: Married  Catering manager Violence: Not At Risk (06/17/2023)   Humiliation, Afraid, Rape, and Kick questionnaire    Fear of Current or Ex-Partner: No    Emotionally Abused: No    Physically Abused: No    Sexually Abused: No     ALLERGIES: Patient has no known allergies.  MEDICATIONS:  Current Outpatient Medications  Medication Sig Dispense Refill   amLODipine  (NORVASC ) 10 MG tablet Take 10 mg by mouth daily.     famotidine  (PEPCID ) 20 MG tablet Take 20 mg by mouth daily.     loperamide (IMODIUM) 2 MG capsule Take 2 mg by mouth as needed for diarrhea or loose stools.     loratadine  (CLARITIN ) 10 MG tablet Take 10 mg by mouth daily.     omeprazole (PRILOSEC) 40 MG capsule Take 40 mg by mouth daily.     sildenafil (VIAGRA) 100 MG tablet Take 100 mg by mouth daily as needed for erectile dysfunction.     No current facility-administered medications for this visit.    REVIEW OF SYSTEMS:  On review of systems, the patient reports that he is doing well  overall.  He denies any chest pain, shortness of breath, cough, fevers, chills, night sweats, or recent unintended weight changes.  He denies any bowel or bladder disturbances, and denies abdominal pain, nausea or vomiting.  He denies any new musculoskeletal or joint aches or pains. A complete review of systems is obtained and is otherwise negative.    PHYSICAL EXAM:  Wt Readings from Last 3 Encounters:  06/17/23 170 lb 8 oz (77.3 kg)  10/12/22 171 lb (77.6 kg)  05/18/22 173 lb 1.6 oz (78.5 kg)   Temp Readings from Last 3 Encounters:  06/20/23 98.5 F (36.9 C) (Oral)  10/18/22 97.8 F (36.6 C)  10/12/22 97.9 F (36.6 C) (Oral)   BP Readings from Last 3 Encounters:  06/20/23 120/75  10/18/22 125/86  10/12/22 119/75   Pulse Readings from Last 3 Encounters:  06/20/23 73  10/18/22 86  10/12/22 78    /10 In general this is a well appearing Caucasian man in no acute distress.  He's alert and oriented x4 and appropriate throughout the examination. Cardiopulmonary assessment is negative for acute distress and he exhibits normal effort.    KPS = 100  100 - Normal; no complaints; no evidence of disease. 90   - Able to carry on normal activity; minor signs  or symptoms of disease. 80   - Normal activity with effort; some signs or symptoms of disease. 47   - Cares for self; unable to carry on normal activity or to do active work. 60   - Requires occasional assistance, but is able to care for most of his personal needs. 50   - Requires considerable assistance and frequent medical care. 40   - Disabled; requires special care and assistance. 30   - Severely disabled; hospital admission is indicated although death not imminent. 20   - Very sick; hospital admission necessary; active supportive treatment necessary. 10   - Moribund; fatal processes progressing rapidly. 0     - Dead  Karnofsky DA, Abelmann WH, Craver LS and Burchenal JH [phone]) The use of the nitrogen mustards in the palliative treatment of carcinoma: with particular reference to bronchogenic carcinoma Cancer 1 634-56  LABORATORY DATA:  Lab Results  Component Value Date   WBC 6.7 06/20/2023   HGB 14.3 06/20/2023   HCT 43.1 06/20/2023   MCV 81.9 06/20/2023   PLT 164 06/20/2023   Lab Results  Component Value Date   NA 137 06/20/2023   K 3.6 06/20/2023   CL 106 06/20/2023   CO2 23 06/20/2023   Lab Results  Component Value Date   ALT 23 06/18/2023   AST 20 06/18/2023   ALKPHOS 83 06/18/2023   BILITOT 1.0 06/18/2023     RADIOGRAPHY: NM PET (PSMA) SKULL TO MID THIGH Result Date: 01/11/2024 CLINICAL DATA:  Prostate carcinoma with biochemical recurrence. EXAM: NUCLEAR MEDICINE PET SKULL BASE TO THIGH TECHNIQUE: 8.3 mCi Flotufolastat (Posluma ) was injected intravenously. Full-ring PET imaging was performed from the skull base to thigh after the radiotracer. CT data was obtained and used for attenuation correction and anatomic localization. COMPARISON:  PSMA PET scan 04/27/2021 FINDINGS: NECK No radiotracer activity in neck lymph nodes. Incidental CT finding: None. CHEST No radiotracer accumulation within mediastinal or hilar lymph nodes. No suspicious pulmonary nodules on the CT  scan. Incidental CT finding: None. ABDOMEN/PELVIS Prostate: Post prostatectomy. Abnormal radiotracer active the prostatectomy bed. Lymph nodes: No radiotracer avid iliac lymph nodes. Faint activity associated with a LEFT periaortic lymph node just above the  aortic bifurcation measuring 5 mm image 136/series 4. This node has mild radiotracer activity SUV max equal 3.5 on image 136. Mild activity associated with a RIGHT adjacent node which is even smaller on same image. Previously described celiac lymph node is favored a LEFT periaortic sympathetic celiac ganglia. Liver: No evidence of liver metastasis. Incidental CT finding: Mid sigmoid colon anastomosis. Multiple diverticula of the descending colon and sigmoid colon without acute inflammation. SKELETON No focal activity to suggest skeletal metastasis. IMPRESSION: 1. No evidence of local prostate cancer recurrence in the prostatectomy bed. 2. No evidence of metastatic adenopathy in the pelvis. 3. Mild activity associated with two small LEFT periaortic lymph node just above the aortic bifurcation. Findings concerning for prostate cancer nodal metastasis. As lesions are very small consider close attention on follow-up. 4. No evidence of visceral metastasis or skeletal metastasis. Electronically Signed   By: Jackquline Boxer M.D.   On: 01/11/2024 11:40      IMPRESSION/PLAN: 1. 73 y.o. 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 Current PSA is 0.26.  Today, we talked to the patient and his wife about the findings and workup thus far. We discussed the natural history of oligometastatic prostate carcinoma and general treatment, highlighting the role of radiotherapy in the management. We discussed the available radiation techniques, and focused on the details and logistics of delivery.  The recommendation is for a 10 fraction  course of ultra hypofractionated radiotherapy (UHRT) to the PET positive periaortic lymph nodes and nearby nodal echelon.  We reviewed the anticipated acute and late sequelae associated with radiation in this setting.  We also discussed the potential role for ST-ADT in the management of oligometastatic disease.  This has not yet been proven to prolong overall survival but does have known risks for cardiac disease and bone demineralization as well as side effects that negatively impact quality of life so risks likely outweigh any small potential benefit.  The patient and his wife were encouraged to ask questions that were answered to their stated satisfaction.   At the conclusion of our conversation, the patient would like to proceed with the recommended 2-week course of ultra hypofractionated radiotherapy (UHRT) to the PET positive periaortic lymph nodes and nearby nodal echelon without ADT.  He prefers to reserve the use of ADT for future use should the PSA continue to rise despite treatment.  He has freely signed written consent to proceed today in the office and a copy of this document will be placed in his medical record. He is tentatively scheduled for CT simulation/treatment planning on Monday, 02/20/2024 at 2 PM so we will share our discussion with Dr. Renda and proceed with treatment planning accordingly, in anticipation of beginning his treatments in the near future.  We enjoyed meeting with him and his wife again today and look forward to continue to participate in his care.  We personally spent 60 minutes in this encounter including chart review, reviewing radiological studies, meeting face-to-face with the patient, entering orders and completing documentation.    Sabra MICAEL Rusk, PA-C    Donnice Barge, MD  [facility]  Radiation Oncology Direct Dial: [phone]  Fax: [phone] [email or web address]  Skype  LinkedIn

## 2024-02-20 ENCOUNTER — Ambulatory Visit
Admission: RE | Admit: 2024-02-20 | Discharge: 2024-02-20 | Disposition: A | Discharge status: Home / Self Care | Source: Ambulatory Visit | Attending: Radiation Oncology | Admitting: Radiation Oncology

## 2024-02-20 DIAGNOSIS — Z510 Encounter for antineoplastic radiation therapy: Secondary | ICD-10-CM | POA: Insufficient documentation

## 2024-02-20 DIAGNOSIS — C61 Malignant neoplasm of prostate: Secondary | ICD-10-CM | POA: Insufficient documentation

## 2024-02-20 DIAGNOSIS — C772 Secondary and unspecified malignant neoplasm of intra-abdominal lymph nodes: Secondary | ICD-10-CM | POA: Diagnosis present

## 2024-02-21 NOTE — Progress Notes (Signed)
  Radiation Oncology         [phone] ________________________________  Name: Derek Cardenas MRN: [id number]  Date: 02/20/2024  DOB: Sep 17, 1950  ULTRAHYPOFRACTIONATED RADIOTHERAPY  SIMULATION AND TREATMENT PLANNING NOTE    ICD-10-CM   1. 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 Current PSA is 0.26.  NARRATIVE:  The patient was brought to the CT Simulation planning suite.  Identity was confirmed.  All relevant records and images related to the planned course of therapy were reviewed.  The patient freely provided informed written consent to proceed with treatment after reviewing the details related to the planned course of therapy. The consent form was witnessed and verified by the simulation staff.  Then, the patient was set-up in a stable reproducible  supine position for radiation therapy.  A BodyFix immobilization pillow was fabricated for reproducible positioning.  Surface markings were placed.  The CT images were loaded into the planning software.  The gross target volumes (GTV) and planning target volumes (PTV) were delinieated, and avoidance structures were contoured.  Treatment planning then occurred.  The radiation prescription was entered and confirmed.  A total of two complex treatment devices were fabricated in the form of the BodyFix immobilization pillow and a neck accuform cushion.  I have requested : 3D Simulation  I have requested a DVH of the following structures: targets and all normal structures near the target including small bowel, spinal cord, kidneys and others as noted on the radiation plan to maintain doses in adherence with established limits  SPECIAL TREATMENT PROCEDURE:  The planned course of therapy using radiation constitutes a special treatment procedure.  Special care is required in the management of this patient for the following reasons. High dose per fraction requiring special monitoring for increased toxicities of treatment including daily imaging..  The special nature of the planned course of radiotherapy will require increased physician supervision and oversight to ensure patient's safety with optimal treatment outcomes.    This requires extended time and effort.    PLAN:  The patient will receive 50 Gy in 10 fractions to the PET positive and more conspicuous visible nodes with 30 Gy in 10 fractions to the involved nodal echelon using dose painting.  ________________________________  Donnice LABOR. Patrcia, M.D.

## 2024-02-28 DIAGNOSIS — Z510 Encounter for antineoplastic radiation therapy: Secondary | ICD-10-CM | POA: Diagnosis not present

## 2024-02-29 ENCOUNTER — Ambulatory Visit
Admission: RE | Admit: 2024-02-29 | Discharge: 2024-02-29 | Disposition: A | Discharge status: Home / Self Care | Source: Ambulatory Visit | Attending: Radiation Oncology | Admitting: Radiation Oncology

## 2024-02-29 ENCOUNTER — Other Ambulatory Visit: Payer: Self-pay

## 2024-02-29 DIAGNOSIS — Z510 Encounter for antineoplastic radiation therapy: Secondary | ICD-10-CM | POA: Diagnosis not present

## 2024-02-29 LAB — RAD ONC ARIA SESSION SUMMARY
Course Elapsed Days: 0
Plan Fractions Treated to Date: 1
Plan Prescribed Dose Per Fraction: 5 Gy
Plan Total Fractions Prescribed: 10
Plan Total Prescribed Dose: 50 Gy
Reference Point Dosage Given to Date: 5 Gy
Reference Point Session Dosage Given: 5 Gy
Session Number: 1

## 2024-03-01 ENCOUNTER — Ambulatory Visit
Admission: RE | Admit: 2024-03-01 | Discharge: 2024-03-01 | Disposition: A | Discharge status: Home / Self Care | Source: Ambulatory Visit | Attending: Radiation Oncology | Admitting: Radiation Oncology

## 2024-03-01 ENCOUNTER — Other Ambulatory Visit: Payer: Self-pay

## 2024-03-01 DIAGNOSIS — Z510 Encounter for antineoplastic radiation therapy: Secondary | ICD-10-CM | POA: Diagnosis not present

## 2024-03-01 LAB — RAD ONC ARIA SESSION SUMMARY
Course Elapsed Days: 1
Plan Fractions Treated to Date: 2
Plan Prescribed Dose Per Fraction: 5 Gy
Plan Total Fractions Prescribed: 10
Plan Total Prescribed Dose: 50 Gy
Reference Point Dosage Given to Date: 10 Gy
Reference Point Session Dosage Given: 5 Gy
Session Number: 2

## 2024-03-01 NOTE — Progress Notes (Signed)
 Derek Cardenas was in for radiation treatment this afternoon and wife wanted to inform nurse that yesterday 02/29/2024 after treatment they went to have lunch and shortly after eating meal wasn't feeling well.  Reports upset stomach and vomiting x 2, and feeling a little weak.  Went home laid down for a bit then had some soup and a Powerade drink.  Denies fever/chills, diarrhea, or any other stomach related issues at this time.  RN advised to continue to eat something light (soup & crackers and drink some ginger ale).  RN advised if should develop fever/chills or uncontrollable diarrhea  to call back or go to ED to be evaluated.  Derek Cardenas is scheduled to see the doctor tomorrow 03/02/2024 for undertreat visit.

## 2024-03-02 ENCOUNTER — Ambulatory Visit
Admission: RE | Admit: 2024-03-02 | Discharge: 2024-03-02 | Disposition: A | Discharge status: Home / Self Care | Source: Ambulatory Visit | Attending: Radiation Oncology | Admitting: Radiation Oncology

## 2024-03-02 ENCOUNTER — Other Ambulatory Visit: Payer: Self-pay | Admitting: Radiation Oncology

## 2024-03-02 ENCOUNTER — Other Ambulatory Visit: Payer: Self-pay

## 2024-03-02 DIAGNOSIS — Z510 Encounter for antineoplastic radiation therapy: Secondary | ICD-10-CM | POA: Diagnosis not present

## 2024-03-02 LAB — RAD ONC ARIA SESSION SUMMARY
Course Elapsed Days: 2
Plan Fractions Treated to Date: 3
Plan Prescribed Dose Per Fraction: 5 Gy
Plan Total Fractions Prescribed: 10
Plan Total Prescribed Dose: 50 Gy
Reference Point Dosage Given to Date: 15 Gy
Reference Point Session Dosage Given: 5 Gy
Session Number: 3

## 2024-03-02 MED ORDER — ONDANSETRON HCL 8 MG PO TABS: 8.0000 mg | ORAL_TABLET | Freq: Three times a day (TID) | ORAL | 1 refills | Status: AC | PRN

## 2024-03-05 ENCOUNTER — Other Ambulatory Visit: Payer: Self-pay

## 2024-03-05 ENCOUNTER — Ambulatory Visit
Admission: RE | Admit: 2024-03-05 | Discharge: 2024-03-05 | Disposition: A | Discharge status: Home / Self Care | Source: Ambulatory Visit | Attending: Radiation Oncology | Admitting: Radiation Oncology

## 2024-03-05 DIAGNOSIS — Z510 Encounter for antineoplastic radiation therapy: Secondary | ICD-10-CM | POA: Diagnosis not present

## 2024-03-05 LAB — RAD ONC ARIA SESSION SUMMARY
Course Elapsed Days: 5
Plan Fractions Treated to Date: 4
Plan Prescribed Dose Per Fraction: 5 Gy
Plan Total Fractions Prescribed: 10
Plan Total Prescribed Dose: 50 Gy
Reference Point Dosage Given to Date: 20 Gy
Reference Point Session Dosage Given: 5 Gy
Session Number: 4

## 2024-03-06 ENCOUNTER — Ambulatory Visit
Admission: RE | Admit: 2024-03-06 | Discharge: 2024-03-06 | Disposition: A | Discharge status: Home / Self Care | Source: Ambulatory Visit | Attending: Radiation Oncology | Admitting: Radiation Oncology

## 2024-03-06 ENCOUNTER — Other Ambulatory Visit: Payer: Self-pay

## 2024-03-06 DIAGNOSIS — Z510 Encounter for antineoplastic radiation therapy: Secondary | ICD-10-CM | POA: Diagnosis not present

## 2024-03-06 LAB — RAD ONC ARIA SESSION SUMMARY
Course Elapsed Days: 6
Plan Fractions Treated to Date: 5
Plan Prescribed Dose Per Fraction: 5 Gy
Plan Total Fractions Prescribed: 10
Plan Total Prescribed Dose: 50 Gy
Reference Point Dosage Given to Date: 25 Gy
Reference Point Session Dosage Given: 5 Gy
Session Number: 5

## 2024-03-07 ENCOUNTER — Other Ambulatory Visit: Payer: Self-pay

## 2024-03-07 ENCOUNTER — Ambulatory Visit
Admission: RE | Admit: 2024-03-07 | Discharge: 2024-03-07 | Disposition: A | Discharge status: Home / Self Care | Source: Ambulatory Visit | Attending: Radiation Oncology | Admitting: Radiation Oncology

## 2024-03-07 DIAGNOSIS — Z510 Encounter for antineoplastic radiation therapy: Secondary | ICD-10-CM | POA: Diagnosis not present

## 2024-03-07 LAB — RAD ONC ARIA SESSION SUMMARY
Course Elapsed Days: 7
Plan Fractions Treated to Date: 6
Plan Prescribed Dose Per Fraction: 5 Gy
Plan Total Fractions Prescribed: 10
Plan Total Prescribed Dose: 50 Gy
Reference Point Dosage Given to Date: 30 Gy
Reference Point Session Dosage Given: 5 Gy
Session Number: 6

## 2024-03-08 ENCOUNTER — Ambulatory Visit
Admission: RE | Admit: 2024-03-08 | Discharge: 2024-03-08 | Disposition: A | Source: Ambulatory Visit | Attending: Radiation Oncology

## 2024-03-08 ENCOUNTER — Other Ambulatory Visit: Payer: Self-pay

## 2024-03-08 ENCOUNTER — Ambulatory Visit

## 2024-03-08 DIAGNOSIS — Z510 Encounter for antineoplastic radiation therapy: Secondary | ICD-10-CM | POA: Diagnosis not present

## 2024-03-08 LAB — RAD ONC ARIA SESSION SUMMARY
Course Elapsed Days: 8
Plan Fractions Treated to Date: 7
Plan Prescribed Dose Per Fraction: 5 Gy
Plan Total Fractions Prescribed: 10
Plan Total Prescribed Dose: 50 Gy
Reference Point Dosage Given to Date: 35 Gy
Reference Point Session Dosage Given: 5 Gy
Session Number: 7

## 2024-03-09 ENCOUNTER — Other Ambulatory Visit: Payer: Self-pay

## 2024-03-09 ENCOUNTER — Ambulatory Visit
Admission: RE | Admit: 2024-03-09 | Discharge: 2024-03-09 | Disposition: A | Discharge status: Home / Self Care | Source: Ambulatory Visit | Attending: Radiation Oncology | Admitting: Radiation Oncology

## 2024-03-09 DIAGNOSIS — Z510 Encounter for antineoplastic radiation therapy: Secondary | ICD-10-CM | POA: Diagnosis not present

## 2024-03-09 LAB — RAD ONC ARIA SESSION SUMMARY
Course Elapsed Days: 9
Plan Fractions Treated to Date: 8
Plan Prescribed Dose Per Fraction: 5 Gy
Plan Total Fractions Prescribed: 10
Plan Total Prescribed Dose: 50 Gy
Reference Point Dosage Given to Date: 40 Gy
Reference Point Session Dosage Given: 5 Gy
Session Number: 8

## 2024-03-12 ENCOUNTER — Other Ambulatory Visit: Payer: Self-pay

## 2024-03-12 ENCOUNTER — Ambulatory Visit
Admission: RE | Admit: 2024-03-12 | Discharge: 2024-03-12 | Disposition: A | Source: Ambulatory Visit | Attending: Radiation Oncology

## 2024-03-12 DIAGNOSIS — Z510 Encounter for antineoplastic radiation therapy: Secondary | ICD-10-CM | POA: Diagnosis not present

## 2024-03-12 LAB — RAD ONC ARIA SESSION SUMMARY
Course Elapsed Days: 12
Plan Fractions Treated to Date: 9
Plan Prescribed Dose Per Fraction: 5 Gy
Plan Total Fractions Prescribed: 10
Plan Total Prescribed Dose: 50 Gy
Reference Point Dosage Given to Date: 45 Gy
Reference Point Session Dosage Given: 5 Gy
Session Number: 9

## 2024-03-13 ENCOUNTER — Ambulatory Visit
Admission: RE | Admit: 2024-03-13 | Discharge: 2024-03-13 | Disposition: A | Source: Ambulatory Visit | Attending: Radiation Oncology

## 2024-03-13 ENCOUNTER — Other Ambulatory Visit: Payer: Self-pay

## 2024-03-13 DIAGNOSIS — Z510 Encounter for antineoplastic radiation therapy: Secondary | ICD-10-CM | POA: Diagnosis not present

## 2024-03-13 LAB — RAD ONC ARIA SESSION SUMMARY
Course Elapsed Days: 13
Plan Fractions Treated to Date: 10
Plan Prescribed Dose Per Fraction: 5 Gy
Plan Total Fractions Prescribed: 10
Plan Total Prescribed Dose: 50 Gy
Reference Point Dosage Given to Date: 50 Gy
Reference Point Session Dosage Given: 5 Gy
Session Number: 10

## 2024-03-14 NOTE — Radiation Completion Notes (Signed)
 Patient Name: Derek Cardenas, Derek Cardenas MRN: [id number] Date of Birth: [birth year] Referring Physician: NORETTA FERRARA, M.D. Date of Service: 2024-03-14 Radiation Oncologist: Adina Barge, M.D. [facility] [place]                             RADIATION ONCOLOGY END OF TREATMENT NOTE     Diagnosis: C61 Malignant neoplasm of prostate Staging on 2021-03-31: Malignant neoplasm of prostate (HCC) T=cT2a, N=cN1, M=cM0 Intent: Curative     ==========DELIVERED PLANS==========  First Treatment Date: 2024-02-29 Last Treatment Date: 2024-03-13   Plan Name: Abd Site: Abdomen Technique: IMRT Mode: Photon Dose Per Fraction: 5 Gy Prescribed Dose (Delivered / Prescribed): 50 Gy / 50 Gy Prescribed Fxs (Delivered / Prescribed): 10 / 10     ==========ON TREATMENT VISIT DATES========== 2024-03-02, 2024-03-09     ==========UPCOMING VISITS==========       ==========APPENDIX - ON TREATMENT VISIT NOTES==========   See weekly On Treatment Notes in Epic for details in the Media tab (listed as Progress notes on the On Treatment Visit Dates listed above).
